# Patient Record
Sex: Male | Born: 1955 | Race: White | Hispanic: No | Marital: Married | Smoking: Former smoker
Health system: Southern US, Community
[De-identification: ages and names within clinical notes are randomized; demographics above are authoritative.]

## PROBLEM LIST (undated history)

## (undated) DIAGNOSIS — I639 Cerebral infarction, unspecified: Secondary | ICD-10-CM

## (undated) DIAGNOSIS — G4730 Sleep apnea, unspecified: Secondary | ICD-10-CM

## (undated) DIAGNOSIS — Z87442 Personal history of urinary calculi: Secondary | ICD-10-CM

## (undated) DIAGNOSIS — K700 Alcoholic fatty liver: Secondary | ICD-10-CM

## (undated) DIAGNOSIS — I10 Essential (primary) hypertension: Secondary | ICD-10-CM

## (undated) HISTORY — PX: COLONOSCOPY: SHX174

## (undated) HISTORY — PX: JOINT REPLACEMENT: SHX530

## (undated) HISTORY — PX: BACK SURGERY: SHX140

## (undated) HISTORY — PX: HERNIA REPAIR: SHX51

---

## 2006-09-03 HISTORY — PX: OTHER SURGICAL HISTORY: SHX169

## 2014-09-03 DIAGNOSIS — I639 Cerebral infarction, unspecified: Secondary | ICD-10-CM

## 2014-09-03 HISTORY — DX: Cerebral infarction, unspecified: I63.9

## 2014-10-05 ENCOUNTER — Other Ambulatory Visit (HOSPITAL_BASED_OUTPATIENT_CLINIC_OR_DEPARTMENT_OTHER): Payer: Self-pay | Admitting: Neurosurgery

## 2014-10-06 ENCOUNTER — Other Ambulatory Visit (HOSPITAL_BASED_OUTPATIENT_CLINIC_OR_DEPARTMENT_OTHER): Payer: Self-pay | Admitting: Neurosurgery

## 2014-10-06 DIAGNOSIS — M5126 Other intervertebral disc displacement, lumbar region: Secondary | ICD-10-CM

## 2014-10-07 ENCOUNTER — Ambulatory Visit: Payer: BLUE CROSS/BLUE SHIELD | Attending: Neurosurgery | Admitting: Physical Therapy

## 2014-10-07 DIAGNOSIS — M5126 Other intervertebral disc displacement, lumbar region: Secondary | ICD-10-CM | POA: Diagnosis present

## 2014-10-09 ENCOUNTER — Ambulatory Visit (HOSPITAL_BASED_OUTPATIENT_CLINIC_OR_DEPARTMENT_OTHER)
Admission: RE | Admit: 2014-10-09 | Discharge: 2014-10-09 | Disposition: A | Discharge status: Home / Self Care | Payer: BLUE CROSS/BLUE SHIELD | Source: Ambulatory Visit | Attending: Neurosurgery | Admitting: Neurosurgery

## 2014-10-09 DIAGNOSIS — M5126 Other intervertebral disc displacement, lumbar region: Secondary | ICD-10-CM

## 2014-10-09 DIAGNOSIS — M4806 Spinal stenosis, lumbar region: Secondary | ICD-10-CM | POA: Insufficient documentation

## 2014-10-09 DIAGNOSIS — R200 Anesthesia of skin: Secondary | ICD-10-CM | POA: Insufficient documentation

## 2014-10-09 MED ORDER — GADOBENATE DIMEGLUMINE 529 MG/ML IV SOLN: 20.0000 mL | Freq: Once | INTRAVENOUS | Status: AC | PRN

## 2014-10-11 ENCOUNTER — Ambulatory Visit: Payer: BLUE CROSS/BLUE SHIELD | Admitting: Physical Therapy

## 2014-10-11 DIAGNOSIS — M5126 Other intervertebral disc displacement, lumbar region: Secondary | ICD-10-CM | POA: Diagnosis not present

## 2014-10-14 ENCOUNTER — Ambulatory Visit: Payer: BLUE CROSS/BLUE SHIELD | Admitting: Physical Therapy

## 2014-10-14 DIAGNOSIS — M5126 Other intervertebral disc displacement, lumbar region: Secondary | ICD-10-CM | POA: Diagnosis not present

## 2014-11-01 ENCOUNTER — Encounter: Payer: Self-pay | Admitting: Physical Therapy

## 2014-11-01 ENCOUNTER — Ambulatory Visit: Payer: BLUE CROSS/BLUE SHIELD | Admitting: Physical Therapy

## 2014-11-01 DIAGNOSIS — M545 Low back pain, unspecified: Secondary | ICD-10-CM

## 2014-11-01 DIAGNOSIS — M2560 Stiffness of unspecified joint, not elsewhere classified: Secondary | ICD-10-CM

## 2014-11-01 DIAGNOSIS — M2569 Stiffness of other specified joint, not elsewhere classified: Secondary | ICD-10-CM

## 2014-11-01 DIAGNOSIS — M5126 Other intervertebral disc displacement, lumbar region: Secondary | ICD-10-CM | POA: Diagnosis not present

## 2014-11-01 NOTE — Therapy (Signed)
[facility] [address]  [address] [place], [place], [address] Phone: [phone]   Fax:  [phone]  Physical Therapy Treatment  Patient Details  Name: Christian Mcintyre MRN: [id number] Date of Birth: Dec 23, 1955 Referring Provider:  Mariam Dollar, MD  Encounter Date: 11/01/2014      PT End of Session - 11/01/14 1545    Visit Number 4   Number of Visits 12   Date for PT Re-Evaluation 11/18/14   PT Start Time 1538   PT Stop Time 1625   PT Time Calculation (min) 47 min      History reviewed. No pertinent past medical history.  Past Surgical History  Procedure Laterality Date  . Lumbar l4/5 fusion in 2008 Bilateral 2008    There were no vitals taken for this visit.  Visit Diagnosis:  LBP radiating to right leg  Back stiffness      Subjective Assessment - 11/01/14 1541    Symptoms pt has been on cruise out of country for past 2 weeks and returned to town yesterday.  States his activity level was not limited by back pain and he was able to participate in a great deal of walking and was able to participate in zipline.   Currently in Pain? Yes   Pain Score 4    Pain Location Back   Pain Orientation --  center and Right lower back pain   Pain Radiating Towards notes intermittent shooting pain down posterior R LE to foot.  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   Multiple Pain Sites No           TODAY'S TREATMENT: THEREX - Stretch B HS, Bridge 15x Low Row 45# 15x, 65# 15x Staggered Standing one arm row diagonals Double Black TB 10x each Staggered Standing punch diagonals Double Black TB 10x each Standing Hip EXT Blue TB at Ankles 15x Each (noted mild increased R LE pain following this) Prone B Knee Flexion Stretch 2x20"  MANUAL - R LE nerve glides, Prone PA grade 3->4 L1 to 4 and L5/S1.  Mechanical Traction (see below)         [facility] Adult PT Treatment/Exercise - 11/01/14 0001    Modalities   Modalities Traction   Traction   Type of Traction Lumbar  prone, neutral pull   Min (lbs) 38   Max (lbs) 76   Hold Time 60   Rest Time 20   Time 15                PT Education - 11/01/14 1723    Education provided Yes   Person(s) Educated Patient   Methods Explanation;Demonstration   Comprehension Returned demonstration;Verbalized understanding             PT Long Term Goals - 11/01/14 1534    PT LONG TERM GOAL #1   Title pt able to demonstrate / verbalize increased physical activity level by 11/18/14   Status On-going   PT LONG TERM GOAL #2   Title pt independent with advanced HEP as necessary by 11/18/14   Status On-going   PT LONG TERM GOAL #3   Title standing lumbar Extension AROM to 75% normal by 11/18/14   Status Achieved   PT LONG TERM GOAL #4   Title pt reports able to sit at work up to 2 hours without c/o LBP greater than 2/10 by 11/18/14   Status On-going   PT LONG TERM GOAL #5  Title pt able to return to recreational activities to include golfing without limitation by LBP by 11/18/14   Status On-going               Plan - 11/01/14 1724    Clinical Impression Statement pt with improved level of function and was able to participate in great deal of activities while on 2 week cruise since last treatment.  He states he continues to experience LBP to include paind and N/T extending into R LE.   Pt will benefit from skilled therapeutic intervention in order to improve on the following deficits Pain;Improper body mechanics;Decreased strength;Decreased mobility   Rehab Potential Good   PT Frequency 2x / week   PT Duration 6 weeks  POC Began 10/07/14   PT Treatment/Interventions Moist Heat;Therapeutic activities;Patient/family education;Therapeutic exercise;Traction;Ultrasound;Dry needling;Manual techniques;Neuromuscular re-education;Electrical Stimulation;Cryotherapy   PT Next Visit Plan squat assessment and training if able   Consulted  and Agree with Plan of Care Patient        Problem List There are no active problems to display for this patient.   [facility],Cameron Schwinn  PT, OCS  11/01/2014, 5:33 PM  [facility] [address]  [address] [place], [place], [address] Phone: [phone]   Fax:  [phone]

## 2014-11-01 NOTE — Patient Instructions (Signed)
HEP update: Staggered standing one arm row and punch 10-15 reps 3x/day Standing Hip Ext Blue TB at ankles 10-15 reps 3x/day

## 2014-11-04 ENCOUNTER — Ambulatory Visit: Payer: BLUE CROSS/BLUE SHIELD | Attending: Neurosurgery | Admitting: Physical Therapy

## 2014-11-04 ENCOUNTER — Encounter: Payer: Self-pay | Admitting: Physical Therapy

## 2014-11-04 DIAGNOSIS — M2560 Stiffness of unspecified joint, not elsewhere classified: Secondary | ICD-10-CM

## 2014-11-04 DIAGNOSIS — M5126 Other intervertebral disc displacement, lumbar region: Secondary | ICD-10-CM | POA: Diagnosis not present

## 2014-11-04 DIAGNOSIS — M2569 Stiffness of other specified joint, not elsewhere classified: Secondary | ICD-10-CM

## 2014-11-04 DIAGNOSIS — M545 Low back pain, unspecified: Secondary | ICD-10-CM

## 2014-11-04 NOTE — Therapy (Signed)
[facility] Outpatient Rehabilitation [facility] [address]  [address] [place], [place], [address] Phone: [phone]   Fax:  [phone]  Physical Therapy Treatment  Patient Details  Name: Christian Mcintyre MRN: [id number] Date of Birth: [birth year] Referring Provider:  Mariam Dollar, MD  Encounter Date: 11/04/2014      PT End of Session - 11/04/14 0808    Visit Number 5   Number of Visits 12   Date for PT Re-Evaluation 11/18/14   PT Start Time 0807   PT Stop Time 0902   PT Time Calculation (min) 55 min      History reviewed. No pertinent past medical history.  Past Surgical History  Procedure Laterality Date  . Lumbar l4/5 fusion in 2008 Bilateral 2008    There were no vitals taken for this visit.  Visit Diagnosis:  LBP radiating to right leg  Back stiffness      Subjective Assessment - 11/04/14 0809    Symptoms noted mild increase in pain following last treatment but states by evening "felt way better".   Currently in Pain? Yes   Pain Score 4    Pain Location Back   Pain Orientation --  central and R Lower Back   Multiple Pain Sites No              TODAY'S TREATMENT: THEREX - NuStep LVL 5, 3' Bridge 15x, Stretch B HS, SKTC, Piriformis ALT SLR with Abdominal Bracing 10x each, Partial Curl-up 20x, LE Bicycle 10x each Prone prayer/cobra combo 5x, diagonal prayer stretch 20" each  TRX Squat 10x, TRX OH Squat 10x, TRX pull into Y with neutral spine 10x  Low Row 55# 15x  MANUAL - prone PA mobes Grade 3 and progressing to grade 4 L1 to L3 and L5/S1       [facility] Adult PT Treatment/Exercise - 11/04/14 0840    Modalities   Modalities Traction   Traction   Type of Traction Lumbar  prone, neutral pull   Min (lbs) 40   Max (lbs) 80   Hold Time 60   Rest Time 20   Time 15                     PT Long Term Goals - 11/01/14 1534    PT LONG TERM GOAL #1   Title pt able to demonstrate / verbalize increased physical  activity level by 11/18/14   Status On-going   PT LONG TERM GOAL #2   Title pt independent with advanced HEP as necessary by 11/18/14   Status On-going   PT LONG TERM GOAL #3   Title standing lumbar Extension AROM to 75% normal by 11/18/14   Status Achieved   PT LONG TERM GOAL #4   Title pt reports able to sit at work up to 2 hours without c/o LBP greater than 2/10 by 11/18/14   Status On-going   PT LONG TERM GOAL #5   Title pt able to return to recreational activities to include golfing without limitation by LBP by 11/18/14   Status On-going               Plan - 11/04/14 0844    Clinical Impression Statement tolerated TRX training well and displays good squat mechanics with TRX and no c/o LBP.  Increased traction a few pounds.  Overall is displaying improved level of function and activity tolerance but still with pain in 4/10 range.   PT Next Visit Plan  more squat and lunge training as able   Consulted and Agree with Plan of Care Patient        Problem List There are no active problems to display for this patient.   Paisly Fingerhut PT, OCS 11/04/2014, 9:58 AM  [facility] [address]  [address] [place], [place], [address] Phone: [phone]   Fax:  [phone]

## 2014-11-08 ENCOUNTER — Ambulatory Visit: Payer: BLUE CROSS/BLUE SHIELD | Admitting: Physical Therapy

## 2014-11-08 DIAGNOSIS — M79604 Pain in right leg: Secondary | ICD-10-CM

## 2014-11-08 DIAGNOSIS — M545 Low back pain: Secondary | ICD-10-CM

## 2014-11-08 DIAGNOSIS — M5126 Other intervertebral disc displacement, lumbar region: Secondary | ICD-10-CM | POA: Diagnosis not present

## 2014-11-08 NOTE — Therapy (Signed)
[facility] [address]  [address] [place], [place], [address] Phone: [phone]   Fax:  [phone]  Physical Therapy Treatment  Patient Details  Name: Christian Mcintyre MRN: [id number] Date of Birth: Sep 09, 1955 Referring Provider:  Donalee Citrinram, Gary, MD  Encounter Date: 11/08/2014      PT End of Session - 11/08/14 1538    Visit Number 6   Number of Visits 12   Date for PT Re-Evaluation 11/18/14   PT Start Time 1535   PT Stop Time 1630   PT Time Calculation (min) 55 min      No past medical history on file.  Past Surgical History  Procedure Laterality Date  . Lumbar l4/5 fusion in 2008 Bilateral 2008    There were no vitals taken for this visit.  Visit Diagnosis:  LBP radiating to right leg      Subjective Assessment - 11/08/14 1534    Symptoms states pain increased to 5-6/10 over the weekend believing due to yard work.  Notes R LE N/T to bottom of foot but states seems a little less intense lately. Is sleeping well without limitation by back pain.  Most pain noted with Sitting and relieved with Walking.   Currently in Pain? Yes   Pain Score --  5-6/10 LBP   Pain Orientation --  central and R LBP   Multiple Pain Sites No            TODAY'S TREATMENT: THEREX - NuStep LVL 5, 3' Bridge 15x, Stretch B HS, SKTC, Piriformis LTR 1' ALT SLR with Abdominal Bracing 10x each, Partial Curl-up 20x, Reverse Curl-up with Ball between knees 10x Staggered standing one-arm high row 15# 10x each Prone prayer/cobra combo 5x  MANUAL - prone PA mobes Grade 3 and progressing to grade 4 L1 to L3 and L5/S1        [facility] Adult PT Treatment/Exercise - 11/08/14 0001    Modalities   Modalities Traction   Traction   Type of Traction Lumbar  prone, neutral pull   Min (lbs) 43   Max (lbs) 86   Hold Time 60   Rest Time 20   Time 17                     PT Long Term Goals - 11/01/14 1534    PT LONG TERM GOAL #1   Title pt able to demonstrate / verbalize increased physical activity level by 11/18/14   Status On-going   PT LONG TERM GOAL #2   Title pt independent with advanced HEP as necessary by 11/18/14   Status On-going   PT LONG TERM GOAL #3   Title standing lumbar Extension AROM to 75% normal by 11/18/14   Status Achieved   PT LONG TERM GOAL #4   Title pt reports able to sit at work up to 2 hours without c/o LBP greater than 2/10 by 11/18/14   Status On-going   PT LONG TERM GOAL #5   Title pt able to return to recreational activities to include golfing without limitation by LBP by 11/18/14   Status On-going               Plan - 11/08/14 1614    Clinical Impression Statement pt with increased pain due to increased activity over the weekend.  Pain most noted with sitting and decreased with walking (common with HNP).  Notes continued but not worsened R LE N/T.   PT  Next Visit Plan return to more squat and lunge training next treatment if improved pain        Problem List There are no active problems to display for this patient.   Hallee Mckenny PT, OCS 11/08/2014, 5:06 PM  [facility] [address]  [address] [place], [place], [address] Phone: [phone]   Fax:  [phone]

## 2014-11-11 ENCOUNTER — Encounter: Payer: Self-pay | Admitting: Physical Therapy

## 2014-11-11 ENCOUNTER — Ambulatory Visit: Payer: BLUE CROSS/BLUE SHIELD | Admitting: Physical Therapy

## 2014-11-11 DIAGNOSIS — M545 Low back pain, unspecified: Secondary | ICD-10-CM

## 2014-11-11 DIAGNOSIS — M5126 Other intervertebral disc displacement, lumbar region: Secondary | ICD-10-CM | POA: Diagnosis not present

## 2014-11-11 NOTE — Therapy (Signed)
[facility] [address]  [address] [place], [place], [address] Phone: [phone]   Fax:  [phone]  Physical Therapy Treatment  Patient Details  Name: Christian Mcintyre MRN: [id number] Date of Birth: Jan 30, 1956 Referring Provider:  Donalee Citrinram, Gary, MD  Encounter Date: 11/11/2014      PT End of Session - 11/11/14 1718    Visit Number 7   Number of Visits 12   Date for PT Re-Evaluation 11/18/14   PT Start Time 1625   PT Stop Time 1725   PT Time Calculation (min) 60 min      History reviewed. No pertinent past medical history.  Past Surgical History  Procedure Laterality Date  . Lumbar l4/5 fusion in 2008 Bilateral 2008    There were no vitals filed for this visit.  Visit Diagnosis:  LBP radiating to right leg      Subjective Assessment - 11/11/14 1627    Symptoms "feeling a little better today", 4/10.   Currently in Pain? Yes   Pain Score 4    Pain Location Back   Pain Orientation --  central and R LBP   Aggravating Factors  sitting   Pain Relieving Factors walking   Multiple Pain Sites No              TODAY'S TREATMENT: THEREX - NuStep LVL 5, 3' Stretch B HS, B LE Nerve Glides LTR 1' Low pulley lawnmower pull 10# 10x each, 20# 10x Standing Lifting Diagonal Black TB 10x each then 10x with Double Black TB Standing Chopping Diagonal Double Black TB 6x (stopped due to noting pain in posterior L LE when performed chopping to L)  MANUAL - prone CPA and B UPA mobes Grade 3 and progressing to grade 4 L1 to L3 and L5/S1 Taping to L-spine as in past (3 strips) Mechanical Traction - L-spine, prone, neutral pull, 60"/30", 86#/43#, 4818'                       PT Long Term Goals - 11/11/14 1721    PT LONG TERM GOAL #1   Title pt able to demonstrate / verbalize increased physical activity level by 11/18/14   Status Achieved   PT LONG TERM GOAL #2   Title pt independent with advanced HEP as  necessary by 11/18/14   Status On-going   PT LONG TERM GOAL #3   Title standing lumbar Extension AROM to 75% normal by 11/18/14   Status Achieved   PT LONG TERM GOAL #4   Title pt reports able to sit at work up to 2 hours without c/o LBP greater than 2/10 by 11/18/14   Status On-going   PT LONG TERM GOAL #5   Title pt able to return to recreational activities to include golfing without limitation by LBP by 11/18/14   Status On-going               Plan - 11/11/14 1718    Clinical Impression Statement pt good progress but may have pushed too hard today as he noted L LE pain with chopping diagonals.  Pain stopped once activity stopped and no pain with manaul following this.  Pt to be seen once next week then will eb out of town again.   Pt will benefit from skilled therapeutic intervention in order to improve on the following deficits Pain;Improper body mechanics;Decreased strength;Decreased mobility   Rehab Potential Good   PT Treatment/Interventions Moist Heat;Therapeutic activities;Patient/family  education;Therapeutic exercise;Traction;Ultrasound;Dry needling;Manual techniques;Neuromuscular re-education;Electrical Stimulation;Cryotherapy   PT Next Visit Plan re-assess for extension to MD?   Consulted and Agree with Plan of Care Patient        Problem List There are no active problems to display for this patient.   Jiselle Sheu PT, OCS 11/11/2014, 5:28 PM  [facility] [address]  [address] [place], [place], [address] Phone: [phone]   Fax:  [phone]

## 2014-11-15 ENCOUNTER — Ambulatory Visit: Payer: BLUE CROSS/BLUE SHIELD | Admitting: Physical Therapy

## 2014-11-15 ENCOUNTER — Encounter: Payer: Self-pay | Admitting: Physical Therapy

## 2014-11-15 DIAGNOSIS — M2569 Stiffness of other specified joint, not elsewhere classified: Secondary | ICD-10-CM

## 2014-11-15 DIAGNOSIS — M545 Low back pain, unspecified: Secondary | ICD-10-CM

## 2014-11-15 DIAGNOSIS — M5126 Other intervertebral disc displacement, lumbar region: Secondary | ICD-10-CM | POA: Diagnosis not present

## 2014-11-15 DIAGNOSIS — M2560 Stiffness of unspecified joint, not elsewhere classified: Secondary | ICD-10-CM

## 2014-11-15 NOTE — Therapy (Signed)
[facility] [address]  [address] [place], [place], [address] Phone: [phone]   Fax:  [phone]  Physical Therapy Treatment  Patient Details  Name: Christian Mcintyre MRN: [id number] Date of Birth: 06/13/1956 Referring Provider:  Donalee Citrinram, Gary, MD  Encounter Date: 11/15/2014      PT End of Session - 11/15/14 1541    Visit Number 8   Number of Visits 12   Date for PT Re-Evaluation 11/18/14   PT Start Time 1540   PT Stop Time 1638   PT Time Calculation (min) 58 min      History reviewed. No pertinent past medical history.  Past Surgical History  Procedure Laterality Date  . Lumbar l4/5 fusion in 2008 Bilateral 2008    There were no vitals filed for this visit.  Visit Diagnosis:  LBP radiating to right leg  Back stiffness      Subjective Assessment - 11/15/14 1542    Symptoms no increased soreness after last treatment, went driving range and hit 324+100+ balls with minimal increased soreness.  Rates LBP 5/10 today.  Pt is going out of town for golf trip tomorrow.   Currently in Pain? Yes   Pain Score 5    Pain Location Back   Pain Orientation --  central and R LBP   Multiple Pain Sites No          TODAY'S TREATMENT: TherEx - NuStep LVL 5, 4' Stretch B HS and Piri, R LE Nerve Glides Partial Curl up 20x Bridge 20x Unsupported LTR 2x20" Partial Single Leg Bridge 10x each Fitter BW Lunge 2 Black 2x15 each Pulldown 55# 15x, 65# 15x Low Pulley Row "Lawnmower" 20# 10x each Mid Pulley "Punch" 10# 2x12 Prayer/Cobra stretch combo 10x  Mechanical Traction - L-spine, Prone, Neutral pull, 86#/43#, 60"/20", 18'                           PT Long Term Goals - 11/15/14 1625    PT LONG TERM GOAL #1   Title pt able to demonstrate / verbalize increased physical activity level by 11/18/14   Status Achieved   PT LONG TERM GOAL #2   Title pt independent with advanced HEP as necessary by 11/18/14   PT LONG TERM GOAL #3   Title standing lumbar Extension AROM to 75% normal by 11/18/14   Status Achieved   PT LONG TERM GOAL #4   Title pt reports able to sit at work up to 2 hours without c/o LBP greater than 2/10 by 11/18/14   Status On-going   PT LONG TERM GOAL #5   Title pt able to return to recreational activities to include golfing without limitation by LBP by 11/18/14   Status On-going               Plan - 11/15/14 1623    Clinical Impression Statement no increasing pain with increasing intensity of treatments.  Tolerated unsupported LTR well (challenging).  N/T to R LE really not going away however despite improving pain and function.  Pt out of town for rest of this week for golf trip. Will re-assess upon return to update to MD.   Pt will benefit from skilled therapeutic intervention in order to improve on the following deficits Pain;Improper body mechanics;Decreased strength;Decreased mobility   Rehab Potential Good   PT Next Visit Plan re-assess for PR to MD   Consulted and Agree with Plan of  Care Patient        Problem List There are no active problems to display for this patient.   [facility] PT, OCS 11/15/2014, 4:47 PM  [facility] [address]  [address] [place], [place], [address] Phone: [phone]   Fax:  [phone]

## 2014-11-23 ENCOUNTER — Ambulatory Visit: Payer: BLUE CROSS/BLUE SHIELD | Admitting: Physical Therapy

## 2014-11-23 DIAGNOSIS — M2560 Stiffness of unspecified joint, not elsewhere classified: Secondary | ICD-10-CM

## 2014-11-23 DIAGNOSIS — M545 Low back pain: Secondary | ICD-10-CM

## 2014-11-23 DIAGNOSIS — M5126 Other intervertebral disc displacement, lumbar region: Secondary | ICD-10-CM | POA: Diagnosis not present

## 2014-11-23 DIAGNOSIS — M79604 Pain in right leg: Secondary | ICD-10-CM

## 2014-11-23 DIAGNOSIS — M2569 Stiffness of other specified joint, not elsewhere classified: Secondary | ICD-10-CM

## 2014-11-23 NOTE — Therapy (Signed)
[facility] [address]  [place], [place], [address] Phone: [phone]   Fax:  [phone]  Physical Therapy Treatment  Patient Details  Name: Christian Mcintyre MRN: [id number] Date of Birth: 12/27/1955 Referring Provider:  Kary Kos, MD  Encounter Date: 11/23/2014      PT End of Session - 11/23/14 0858    Visit Number 9   Number of Visits 17   Date for PT Re-Evaluation 12/21/14   PT Start Time 7939   PT Stop Time 0945   PT Time Calculation (min) 50 min      No past medical history on file.  Past Surgical History  Procedure Laterality Date  . Lumbar l4/5 fusion in 2008 Bilateral 2008    There were no vitals filed for this visit.  Visit Diagnosis:  LBP radiating to right leg - Plan: PT plan of care cert/re-cert  Back stiffness - Plan: PT plan of care cert/re-cert      Subjective Assessment - 11/23/14 0857    Symptoms went out of town on golf trip, noted increased pain 1st day of golfing but was able to play next 3 days without noting increased pain.  States pain up to 8/10 but otherwise 5-6/10. Is performing HEP.   Currently in Pain? Yes   Pain Score 6    Pain Location Back   Pain Orientation --  Central and R LBP   Aggravating Factors  sitting   Pain Relieving Factors walking and lying down, no problems sleeping but hurts very soon after waking   Multiple Pain Sites No            [facility] PT Assessment - 11/23/14 0001    ROM / Strength   AROM / PROM / Strength AROM;Strength   AROM   Overall AROM Comments Lumbar Flexion hands to toes with shooting pain into R LE, Extension 75% normal without c/o pain.   Strength   Overall Strength Comments B LE MMT 5/5 grossly without pain.  Pt does note intermittent shooting pain and buckling sensation into B LE (R>>L).  States this happens several times/day.        TODAY'S TREATMENT: Manual - B UPA and CPA mobes throughout l-spine other than L4/5 area due  to past fusion at this level  TherEx - NuStep LVL 5, 3' Stretch B HS, Piri, RF, SKTC, LTR Prone SLR 10x each Bridge 15x  Mechanical Traction - l-spine, prone, neutral pull, 86#/43#, 60"/20", 18'                      PT Long Term Goals - 11/23/14 0919    PT LONG TERM GOAL #2   Title pt independent with advanced HEP as necessary by 12/21/14   Status On-going   PT LONG TERM GOAL #4   Title pt reports able to sit at work up to 2 hours without c/o LBP greater than 2/10 by 12/21/14  notes up to 7/10 pain along with increasing R LE N/T with 2 hours sitting   Status On-going   PT LONG TERM GOAL #5   Title pt able to return to recreational activities to include golfing without limitation by LBP by 12/21/14  is able to participate and typically without noting increased pain however did experience bout of 8/10 following 18 holes golf last week but next 3 days was able to golf without increased pain above baseline (5-6/10)   Status Partially Met  Plan - 11/23/14 1033    Clinical Impression Statement pt with good activity tolerance and rarely notes increased pain following treatments; however, overall level of pain still high at 5-6/10 most days.  He notes most pain with sitting and decreased pain with walking.  Pt states he has called MD office to attempt to schedule follow-up but has not heard back at this time.  Pt my be appropriate for l-spine injection/epidural to help decrease his baseline pain.  Recommend patient continues with physical therapy for 4 weeks to further progress level of function and hopefully assist with pain reduction.   Pt will benefit from skilled therapeutic intervention in order to improve on the following deficits Pain   Rehab Potential Good   PT Frequency 2x / week   PT Duration 4 weeks   PT Treatment/Interventions Therapeutic exercise;Manual techniques;Traction;Therapeutic activities;Electrical Stimulation   PT Next Visit Plan  progress as able   Consulted and Agree with Plan of Care Patient        Problem List There are no active problems to display for this patient.   [facility] PT, OCS 11/23/2014, 10:43 AM  [facility] [address]  [place], [place], [address] Phone: [phone]   Fax:  [phone]

## 2014-11-25 ENCOUNTER — Ambulatory Visit: Payer: BLUE CROSS/BLUE SHIELD | Admitting: Physical Therapy

## 2014-11-25 DIAGNOSIS — M79604 Pain in right leg: Secondary | ICD-10-CM

## 2014-11-25 DIAGNOSIS — M545 Low back pain: Secondary | ICD-10-CM

## 2014-11-25 DIAGNOSIS — M5126 Other intervertebral disc displacement, lumbar region: Secondary | ICD-10-CM | POA: Diagnosis not present

## 2014-11-25 NOTE — Therapy (Signed)
[facility] [address]  [place], [place], [address] Phone: [phone]   Fax:  [phone]  Physical Therapy Treatment  Patient Details  Name: Christian Mcintyre MRN: [id number] Date of Birth: [birth year] Referring Provider:  Kary Kos, MD  Encounter Date: 11/25/2014      PT End of Session - 11/25/14 1620    Visit Number 10   Number of Visits 17   Date for PT Re-Evaluation 12/21/14   PT Start Time 3419   PT Stop Time 3790   PT Time Calculation (min) 61 min      No past medical history on file.  Past Surgical History  Procedure Laterality Date  . Lumbar l4/5 fusion in 2008 Bilateral 2008    There were no vitals filed for this visit.  Visit Diagnosis:  LBP radiating to right leg  LBP radiating to both legs      Subjective Assessment - 11/25/14 1538    Symptoms Pt states he has been experiencing shooting pains down B LEs since waking this AM with sit<->stand transfers.  States pain begins in central mid l-spine and extends down B posterior LE to calves.  Unknown reason for increased symptoms today. No change in N/T to R LE - still present without increase or decrease intensity/frequency today per pt report.   Currently in Pain? Yes   Pain Score --  9/10 shooting pain and 6-7/10 residual pain between bouts of increased pain           TODAY'S TREATMENT: Focus on stability today due to symptoms seeming related to poor control/stability today Bridge 15x (noted mild catch on 7th rep but nothing else) Small ROM Reverse Curl-up 10x ALT SLR with Contra Extension isometric 10x Hooklying ALT Single Knee Ext 10x3" each Supine DL SLR 10x2" Quadruped ALT LE 10x (noted mild catch in L-spine upon recovery from this exercise) Quadruped Hip EXT 5x each (fewer reps due to HS cramping) Quadruped Dirty Dog 10x each Quadruped ALT LE/UE 10x Squat with Yellow TB Y 5x (catch on 1st rep but no others) Sit<->Stand 5x (no  pain)  IFC (80-150Hz ) with CP to L-spine prone x 20'                           PT Long Term Goals - 11/23/14 0919    PT LONG TERM GOAL #2   Title pt independent with advanced HEP as necessary by 12/21/14   Status On-going   PT LONG TERM GOAL #4   Title pt reports able to sit at work up to 2 hours without c/o LBP greater than 2/10 by 12/21/14  notes up to 7/10 pain along with increasing R LE N/T with 2 hours sitting   Status On-going   PT LONG TERM GOAL #5   Title pt able to return to recreational activities to include golfing without limitation by LBP by 12/21/14  is able to participate and typically without noting increased pain however did experience bout of 8/10 following 18 holes golf last week but next 3 days was able to golf without increased pain above baseline (5-6/10)   Status Partially Met               Plan - 11/25/14 1620    Clinical Impression Statement pt with more of a stability issue presentation today in that notes frequent shooting pain with sit<->stand transfers.  Treated with stability exercises attempting to promote  better multifiti and lumbar extensor firing.  Seemed at least somewhat benefitcial in that no pain with 5 sit<->stand attempts following exercises.   PT Next Visit Plan assess pain and treat according to symptom presentation, likely continue stablity treatment approach   Consulted and Agree with Plan of Care Patient        Problem List There are no active problems to display for this patient.   [facility] PT, OCS 11/25/2014, 4:44 PM  [facility] [address]  [place], [place], [address] Phone: [phone]   Fax:  [phone]

## 2014-11-29 ENCOUNTER — Ambulatory Visit: Payer: BLUE CROSS/BLUE SHIELD | Admitting: Physical Therapy

## 2014-11-29 DIAGNOSIS — M545 Low back pain, unspecified: Secondary | ICD-10-CM

## 2014-11-29 DIAGNOSIS — M5126 Other intervertebral disc displacement, lumbar region: Secondary | ICD-10-CM | POA: Diagnosis not present

## 2014-11-29 DIAGNOSIS — M79604 Pain in right leg: Secondary | ICD-10-CM

## 2014-11-29 NOTE — Therapy (Signed)
[facility] [address]  [place], [place], [address] Phone: [phone]   Fax:  [phone]  Physical Therapy Treatment  Patient Details  Name: Christian Mcintyre MRN: [id number] Date of Birth: July 30, 1956 Referring Provider:  Kary Kos, MD  Encounter Date: 11/29/2014      PT End of Session - 11/29/14 1612    Visit Number 11   Number of Visits 17   Date for PT Re-Evaluation 12/21/14   PT Start Time 0034   PT Stop Time 1611   PT Time Calculation (min) 33 min      No past medical history on file.  Past Surgical History  Procedure Laterality Date  . Lumbar l4/5 fusion in 2008 Bilateral 2008    There were no vitals filed for this visit.  Visit Diagnosis:  LBP radiating to both legs      Subjective Assessment - 11/29/14 1541    Symptoms Has MD f/u on 12/14/14. States has felt better since last treatment in that no longer experiencing shooting pains into LE with sit<->stand transfers.  He is back at his typical level of 5/10 pain today.  Noted 7/10 pain 2 days ago due to helping his wife after she dislocaed her hip while in the yard.   Currently in Pain? Yes   Pain Score 5    Pain Location Back   Pain Orientation Lower  central and R LBP   Multiple Pain Sites No               TODAY'S TREATMENT: NuStep lvl 5, 4' Continuing with stability focus due to benefit following last treatment Bridge 15x ALT SLR with Contra Extension isometric 10x Supine DL SLR 10x2" Partial Curl-up 10x Diagonal Partial Curl-up 10x Quadruped ALT LE/UE 12x             PT Education - 11/29/14 1612    Education provided Yes   Education Details HEP update   Person(s) Educated Patient   Methods Explanation;Demonstration;Handout   Comprehension Returned demonstration;Verbalized understanding             PT Long Term Goals - 11/23/14 0919    PT LONG TERM GOAL #2   Title pt independent with advanced HEP as  necessary by 12/21/14   Status On-going   PT LONG TERM GOAL #4   Title pt reports able to sit at work up to 2 hours without c/o LBP greater than 2/10 by 12/21/14  notes up to 7/10 pain along with increasing R LE N/T with 2 hours sitting   Status On-going   PT LONG TERM GOAL #5   Title pt able to return to recreational activities to include golfing without limitation by LBP by 12/21/14  is able to participate and typically without noting increased pain however did experience bout of 8/10 following 18 holes golf last week but next 3 days was able to golf without increased pain above baseline (5-6/10)   Status Partially Met               Plan - 11/29/14 1613    Clinical Impression Statement Good progress following last stability focused treatment so continued with this today to include HEP progression with same.  Still concerned due to persistent 5/10 pain per pt report.  Pt to MD in 2 weeks for f/u.   PT Next Visit Plan progress stability training as tolerated, discontinue traction   Consulted and Agree with Plan of Care Patient  Problem List There are no active problems to display for this patient.   [facility] PT, OCS 11/29/2014, 4:17 PM  [facility] [address]  [place], [place], [address] Phone: [phone]   Fax:  [phone]

## 2014-12-02 ENCOUNTER — Ambulatory Visit: Payer: BLUE CROSS/BLUE SHIELD | Admitting: Physical Therapy

## 2014-12-02 DIAGNOSIS — M545 Low back pain, unspecified: Secondary | ICD-10-CM

## 2014-12-02 DIAGNOSIS — M5126 Other intervertebral disc displacement, lumbar region: Secondary | ICD-10-CM | POA: Diagnosis not present

## 2014-12-02 NOTE — Therapy (Signed)
[facility] [address]  [place], [place], [address] Phone: [phone]   Fax:  [phone]  Physical Therapy Treatment  Patient Details  Name: Christian Mcintyre MRN: [id number] Date of Birth: 10-16-1955 Referring Provider:  Kary Kos, MD  Encounter Date: 12/02/2014      PT End of Session - 12/02/14 1748    Visit Number 12   Number of Visits 17   Date for PT Re-Evaluation 12/21/14   PT Start Time 9622   PT Stop Time 1745   PT Time Calculation (min) 35 min      No past medical history on file.  Past Surgical History  Procedure Laterality Date  . Lumbar l4/5 fusion in 2008 Bilateral 2008    There were no vitals filed for this visit.  Visit Diagnosis:  LBP radiating to right leg      Subjective Assessment - 12/02/14 1712    Symptoms No increase in pain since last treatment.  States pain is 4/10 today and can't recall experiencing any shooting pain today.  Still N/T noted to bottom of R foot which he states is present most days any time he is out of bed.  He states this lessens with walking and sometimes may go away if he "walks long enough".  Has MD f/u on 12/14/14.   Currently in Pain? Yes   Pain Score 4    Pain Location Back   Pain Orientation Lower;Right  central and R LBP       Today's Treatment NuStep LVL 5, 4' Stretch B Piri and HS and RF/Iliopsoas TRX Push-up and High rows 10x each with emphasis on trunk stability TRX Y 10x Dead Bug 30" Crunches 15x Standing Pullover 25# 10x Alt Unsupported Chest Press 25# 10x with focus on trunk stability                            PT Long Term Goals - 11/23/14 0919    PT LONG TERM GOAL #2   Title pt independent with advanced HEP as necessary by 12/21/14   Status On-going   PT LONG TERM GOAL #4   Title pt reports able to sit at work up to 2 hours without c/o LBP greater than 2/10 by 12/21/14  notes up to 7/10 pain along with  increasing R LE N/T with 2 hours sitting   Status On-going   PT LONG TERM GOAL #5   Title pt able to return to recreational activities to include golfing without limitation by LBP by 12/21/14  is able to participate and typically without noting increased pain however did experience bout of 8/10 following 18 holes golf last week but next 3 days was able to golf without increased pain above baseline (5-6/10)   Status Partially Met               Plan - 12/02/14 1750    Clinical Impression Statement Seems to have most progress to date lately with more of a shift to stability training.  He goes to MD in less than 2 weeks - will send update.   PT Next Visit Plan progress stability training as tolerated, discontinue traction        Problem List There are no active problems to display for this patient.   Gloristine Turrubiates PT, OCS 12/02/2014, 5:52 PM  [facility] [address]  [address] [place],  [place], [address] Phone: [phone]   Fax:  [phone]

## 2014-12-06 ENCOUNTER — Ambulatory Visit: Payer: BLUE CROSS/BLUE SHIELD | Attending: Neurosurgery | Admitting: Physical Therapy

## 2014-12-06 DIAGNOSIS — M5126 Other intervertebral disc displacement, lumbar region: Secondary | ICD-10-CM | POA: Diagnosis not present

## 2014-12-06 DIAGNOSIS — M545 Low back pain, unspecified: Secondary | ICD-10-CM

## 2014-12-06 DIAGNOSIS — M79604 Pain in right leg: Secondary | ICD-10-CM

## 2014-12-06 NOTE — Therapy (Signed)
[facility] [address]  [place], [place], [address] Phone: [phone]   Fax:  [phone]  Physical Therapy Treatment  Patient Details  Name: Christian Mcintyre MRN: [id number] Date of Birth: 12-Aug-1956 Referring Provider:  Kary Kos, MD  Encounter Date: 12/06/2014      PT End of Session - 12/06/14 0819    Visit Number 13   Number of Visits 17   Date for PT Re-Evaluation 12/21/14   PT Start Time 0815   PT Stop Time 0845   PT Time Calculation (min) 30 min      No past medical history on file.  Past Surgical History  Procedure Laterality Date  . Lumbar l4/5 fusion in 2008 Bilateral 2008    There were no vitals filed for this visit.  Visit Diagnosis:  LBP radiating to right leg      Subjective Assessment - 12/06/14 0816    Subjective States was able to perform some yard and garage work this weekend without noting significant increased pain.  Today states is feeling "pretty good" and rates LBP 3-4/10.  States R foot N/T does not seem as bad today so far (earlier appointment today than typical).  Has MD f/u 12/14/14.   Currently in Pain? Yes   Pain Score 3    Pain Location Back   Pain Orientation Right;Lower      Today's Treatment (mild reduced intensity to limit sweating due to going straight to work after this) NuStep LVL 5, 4' Bridge 15x Stretch B Piri and HS and RF/Iliopsoas Bridge with March 10x with hands on table then 10x without hands on table Crunches 15x Fitter BW Lunge 2 Black 15x with B Pole A Alt Unsupported Chest Press 25# 2x10 with focus on trunk stability Staggered standing one-arm high row 15# 2x15              PT Long Term Goals - 11/23/14 0919    PT LONG TERM GOAL #2   Title pt independent with advanced HEP as necessary by 12/21/14   Status On-going   PT LONG TERM GOAL #4   Title pt reports able to sit at work up to 2 hours without c/o LBP greater than 2/10 by 12/21/14   notes up to 7/10 pain along with increasing R LE N/T with 2 hours sitting   Status On-going   PT LONG TERM GOAL #5   Title pt able to return to recreational activities to include golfing without limitation by LBP by 12/21/14  is able to participate and typically without noting increased pain however did experience bout of 8/10 following 18 holes golf last week but next 3 days was able to golf without increased pain above baseline (5-6/10)   Status Partially Met               Plan - 12/06/14 0841    Clinical Impression Statement best function, some improvement in N/T, and least pain to date this past weekend.  Seems to be  responding very well to progressive stability program without mechanical traction.   PT Next Visit Plan assess for PR to MD for pending MD f/u on 12/13/13        Problem List There are no active problems to display for this patient.   Matson Welch PT, OCS 12/06/2014, 8:55 AM  [facility] [place], [place], [address] Phone: [phone]   Fax:  [phone]

## 2014-12-09 ENCOUNTER — Ambulatory Visit: Payer: BLUE CROSS/BLUE SHIELD | Admitting: Physical Therapy

## 2014-12-13 ENCOUNTER — Ambulatory Visit: Payer: BLUE CROSS/BLUE SHIELD | Admitting: Physical Therapy

## 2014-12-16 ENCOUNTER — Ambulatory Visit: Payer: BLUE CROSS/BLUE SHIELD | Admitting: Physical Therapy

## 2014-12-16 DIAGNOSIS — M545 Low back pain, unspecified: Secondary | ICD-10-CM

## 2014-12-16 DIAGNOSIS — M5126 Other intervertebral disc displacement, lumbar region: Secondary | ICD-10-CM | POA: Diagnosis not present

## 2014-12-16 NOTE — Therapy (Signed)
[facility] [address]  [place], [place], [address] Phone: [phone]   Fax:  [phone]  Physical Therapy Treatment  Patient Details  Name: Christian Mcintyre MRN: [id number] Date of Birth: 09/18/1955 Referring Provider:  Kary Kos, MD  Encounter Date: 12/16/2014      PT End of Session - 12/16/14 0734    Visit Number 14   Number of Visits 17   Date for PT Re-Evaluation 12/30/14   PT Start Time 0727   PT Stop Time 0802   PT Time Calculation (min) 35 min      No past medical history on file.  Past Surgical History  Procedure Laterality Date  . Lumbar l4/5 fusion in 2008 Bilateral 2008    There were no vitals filed for this visit.  Visit Diagnosis:  LBP radiating to right leg      Subjective Assessment - 12/16/14 0729    Subjective Pt saw MD on 12/14/14 and plan is to continue with PT and undergo injection/epidural on 12/29/14.  States if no benefit from injections will pursue surgical options.  States LBP is worse this AM compared to most mornings - states first thing is typically best part of day but this AM pain was 6-7/10 and is currently 5/10.  No idea why increased pain today.   Currently in Pain? Yes   Pain Score 5    Pain Location Back   Pain Orientation Right;Lower   Multiple Pain Sites No          Today's Treatment (pt late so reduced treatment time) NuStep LVL 5, 4' Bridge 15x Stretch B Piri and HS and RF/Iliopsoas Crunches 15x ALT SLR 15x each Side-Lying Clam Black TB 15x each Side Bridge elbow and knee 10x each (difficult but no c/o increased pain) Quadruped UE/LE 10x                           PT Long Term Goals - 11/23/14 0919    PT LONG TERM GOAL #2   Title pt independent with advanced HEP as necessary by 12/21/14   Status On-going   PT LONG TERM GOAL #4   Title pt reports able to sit at work up to 2 hours without c/o LBP greater than 2/10 by 12/21/14   notes up to 7/10 pain along with increasing R LE N/T with 2 hours sitting   Status On-going   PT LONG TERM GOAL #5   Title pt able to return to recreational activities to include golfing without limitation by LBP by 12/21/14  is able to participate and typically without noting increased pain however did experience bout of 8/10 following 18 holes golf last week but next 3 days was able to golf without increased pain above baseline (5-6/10)   Status Partially Met               Plan - 12/16/14 0750    Clinical Impression Statement I was unable to see pt prior to his MD appointment due to me cancelling his appointment here on 12/13/14 due to sick child.  MD has recommended epidural next week and if this does not benefit pt they will discuss surgical options (extending current fusion vs discectomy).  Pt has made progress with PT but he is not content with current level of function and feels as though surgery is inevitable.   PT Next Visit Plan continue current POC for 3 visits  on current POC then PR to MD which will include status after epidural.   Consulted and Agree with Plan of Care Patient        Problem List There are no active problems to display for this patient.   Khyron Garno PT, OCS 12/16/2014, 8:19 AM  [facility] [address]  [place], [place], [address] Phone: [phone]   Fax:  [phone]

## 2014-12-22 ENCOUNTER — Ambulatory Visit: Payer: BLUE CROSS/BLUE SHIELD | Admitting: Physical Therapy

## 2014-12-22 DIAGNOSIS — M5126 Other intervertebral disc displacement, lumbar region: Secondary | ICD-10-CM | POA: Diagnosis not present

## 2014-12-22 DIAGNOSIS — M79604 Pain in right leg: Secondary | ICD-10-CM

## 2014-12-22 DIAGNOSIS — M545 Low back pain: Secondary | ICD-10-CM

## 2014-12-22 NOTE — Therapy (Signed)
[facility] [address]  [place], [place], [address] Phone: [phone]   Fax:  [phone]  Physical Therapy Treatment  Patient Details  Name: Christian Mcintyre MRN: [id number] Date of Birth: [birth year] Referring Provider:  Kary Kos, MD  Encounter Date: 12/22/2014      PT End of Session - 12/22/14 0945    Visit Number 15   Number of Visits 17   Date for PT Re-Evaluation 12/30/14   PT Start Time 0939   PT Stop Time 1015   PT Time Calculation (min) 36 min      No past medical history on file.  Past Surgical History  Procedure Laterality Date  . Lumbar l4/5 fusion in 2008 Bilateral 2008    There were no vitals filed for this visit.  Visit Diagnosis:  LBP radiating to right leg      Subjective Assessment - 12/22/14 0940    Subjective Has epidural scheduled for 12/29/14 and f/u with Dr Saintclair Halsted on 01/13/15 to discuss benefit of injection.  Pt states is feeling good this AM rating 3-4/10.  States able to perform some yardwork (no heavy lifting) noting worse pain 6/10 since last treatment.  No change in N/T - still present bottom of R foot approx 90% of waking hours.   Currently in Pain? Yes   Pain Score --  3-4/10   Pain Location Back   Pain Orientation Right;Lower            [facility] PT Assessment - 12/22/14 0001    AROM   Overall AROM Comments Lumbar Flexion hands to toes, Extension nearly full without c/o increased pain.        TODAY'S TREATMENT NuStep LVL 5, 4'  Bridge 15x  Stretch B Piri and HS and RF/Iliopsoas  Side Bridge elbow and knee 10x each (difficult but no c/o increased pain)  ALT SLR 10x each with Red TB at ankles Diagonal Partial Curl-up 15x each In/Out Abdominal Exercise 10x Staggered Standing one-arm high row 20# 10x each Staggered Standing Punch 20# 10x each SLS Hip Ext double Green TB 10x each with Single Pole A                          PT Long Term Goals -  11/23/14 0919    PT LONG TERM GOAL #2   Title pt independent with advanced HEP as necessary by 12/21/14   Status On-going   PT LONG TERM GOAL #4   Title pt reports able to sit at work up to 2 hours without c/o LBP greater than 2/10 by 12/21/14  notes up to 7/10 pain along with increasing R LE N/T with 2 hours sitting   Status On-going   PT LONG TERM GOAL #5   Title pt able to return to recreational activities to include golfing without limitation by LBP by 12/21/14  is able to participate and typically without noting increased pain however did experience bout of 8/10 following 18 holes golf last week but next 3 days was able to golf without increased pain above baseline (5-6/10)   Status Partially Met               Plan - 12/22/14 1018    Clinical Impression Statement tolerating treatments very well stating notes fatigue without pain   PT Next Visit Plan continue current POC for 2 visits on current POC then PR to MD which will include status  after epidural.        Problem List There are no active problems to display for this patient.   Sylus Stgermain PT, OCS 12/22/2014, 10:20 AM  [facility] [address]  [place], [place], [address] Phone: [phone]   Fax:  [phone]

## 2014-12-24 ENCOUNTER — Ambulatory Visit: Payer: BLUE CROSS/BLUE SHIELD | Admitting: Physical Therapy

## 2014-12-24 DIAGNOSIS — M5126 Other intervertebral disc displacement, lumbar region: Secondary | ICD-10-CM | POA: Diagnosis not present

## 2014-12-24 DIAGNOSIS — M545 Low back pain, unspecified: Secondary | ICD-10-CM

## 2014-12-24 NOTE — Therapy (Signed)
[facility] [address]  [place], [place], [address] Phone: [phone]   Fax:  [phone]  Physical Therapy Treatment  Patient Details  Name: Christian Mcintyre MRN: [id number] Date of Birth: September 24, 1955 Referring Provider:  Kary Kos, MD  Encounter Date: 12/24/2014      PT End of Session - 12/24/14 0812    Visit Number 16   Number of Visits 17   Date for PT Re-Evaluation 12/30/14   PT Start Time 0812   PT Stop Time 0846   PT Time Calculation (min) 34 min      No past medical history on file.  Past Surgical History  Procedure Laterality Date  . Lumbar l4/5 fusion in 2008 Bilateral 2008    There were no vitals filed for this visit.  Visit Diagnosis:  LBP radiating to right leg      Subjective Assessment - 12/24/14 0813    Subjective States exp'ing increased LBP since yesterday (no change to N/T).  States carried some groceries and moved some boxes yesterday evening and noted some pain during this and believes that's why increased pain today.  States felt fine after last treatment without noting increased pain.  Epidural next week (12/29/14) and f/u with MD 01/13/15.  States experiences frequent urge to urinate  but denies loss b/b control.  States this has been present past couple weeks and he experienced these issues in the past prior to last surgery.   Currently in Pain? Yes   Pain Score 6    Pain Location Back   Pain Orientation Right;Lower   Aggravating Factors  prolonged sitting, lift/carry      TODAY'S TREATMENT: Bridge 15x Stretch B Piri and HS and RF/Iliopsoas, B LE Nerve glide LTR 60" Prayer/Cobra combo 10x Quadruped UE/LE 10x Low Row 55# 15x, 65#  Single Hand Low Row 25# 15x Pulldown 55# 15x  Kinesiotape to L-spine (3 stips with 75% strip at L5/S1)  Pt states feels better following treatment today.                             PT Long Term Goals - 12/24/14 0830    PT LONG TERM GOAL #1   Title pt able to demonstrate / verbalize increased physical activity level   Status Achieved   PT LONG TERM GOAL #2   Title pt independent with advanced HEP as necessary by 12/21/14  will adjust following epidural next week   Status On-going   PT LONG TERM GOAL #3   Title standing lumbar Extension AROM to 75% normal   Status Achieved   PT LONG TERM GOAL #4   Title pt reports able to sit at work up to 2 hours without c/o LBP greater than 2/10 by 12/21/14   Status On-going   PT LONG TERM GOAL #5   Title pt able to return to recreational activities to include golfing without limitation by LBP by 12/21/14  continued intermittent difficulty with activities such as yard work and golfing   Status Partially Met               Plan - 12/24/14 0849    Clinical Impression Statement decreased pain with reduced intensity treatment today, applied tape again to assist with further decrease.  Pt to undergo epidural next week and I will re-assess following this.   PT Next Visit Plan re-assess following epidural.   Consulted and Agree with  Plan of Care Patient        Problem List There are no active problems to display for this patient.   Kaitlin Ardito PT, OCS 12/24/2014, 8:50 AM  [facility] [address]  [place], [place], [address] Phone: [phone]   Fax:  [phone]

## 2014-12-27 ENCOUNTER — Ambulatory Visit: Payer: BLUE CROSS/BLUE SHIELD | Admitting: Physical Therapy

## 2014-12-27 DIAGNOSIS — M545 Low back pain, unspecified: Secondary | ICD-10-CM

## 2014-12-27 DIAGNOSIS — M5126 Other intervertebral disc displacement, lumbar region: Secondary | ICD-10-CM | POA: Diagnosis not present

## 2014-12-27 NOTE — Therapy (Signed)
[facility] [address]  [place], [place], [address] Phone: [phone]   Fax:  [phone]  Physical Therapy Treatment  Patient Details  Name: Christian Mcintyre MRN: [id number] Date of Birth: [birth year] Referring Provider:  Kary Kos, MD  Encounter Date: 12/27/2014      PT End of Session - 12/27/14 1322    Visit Number 17   Number of Visits 21   Date for PT Re-Evaluation 01/13/15   PT Start Time 1318   PT Stop Time 1400   PT Time Calculation (min) 42 min      No past medical history on file.  Past Surgical History  Procedure Laterality Date  . Lumbar l4/5 fusion in 2008 Bilateral 2008    There were no vitals filed for this visit.  Visit Diagnosis:  LBP radiating to right leg - Plan: PT plan of care cert/re-cert      Subjective Assessment - 12/27/14 1319    Subjective States felt better following last treatment but soreness in lower back and R LE returned over the weekend and is back to 6/10 today. He's not certain why with increased pain/soreness but states he had to pick up his dog (a full grown lab) some this past weekend.He is scheduled to undergo epidural in 2 days and has f/u with MD on 01/13/15.  He continues to note intermittent buckling sensation to B LE (R>>L).  He state there are days when this does not happen at all but other days will not a few time (had been several times every day in the past).   Currently in Pain? Yes   Pain Score 6    Pain Location Back   Pain Orientation Right;Lower            [facility] PT Assessment - 12/27/14 0001    Balance Screen   Has the patient fallen in the past 6 months No   Functional Tests   Functional tests Squat;Single leg stance   Squat   Comments squats include forward wt shifting with heels lifting off ground.   Single Leg Stance   Comments unsteady B, R limited to 2-3 seconds, L 5-10 seconds   ROM / Strength   AROM / PROM / Strength AROM;Strength   Strength   Overall Strength Comments B LE 5/5 grossly without c/o pain with testing   Special Tests    Special Tests Lumbar   Slump test   Findings Negative   Prone Knee Bend Test   Findings Negative   Straight Leg Raise   Findings Negative     TODAY'S TREATMENT: TherEx - NuStep lvl 5, 3' MMT and assessment Bridge 15x Stretch B HS, Piri, SKTC, Prone Knee Flexion Partial Curl-up 20x Side Bridge 10x each Prone ALT UE/LE 10x Prayer/Cobra combo Quadruped ALT UE 3# / LE 4# 10x each Seated Single Hand Low Row 35# 2x10 each PBall Wall Squat 15x                            PT Long Term Goals - 12/27/14 1323    PT LONG TERM GOAL #1   Title pt able to demonstrate / verbalize increased physical activity level   Status Achieved   PT LONG TERM GOAL #2   Title pt independent with advanced HEP as necessary by 01/13/15  progressing as pt progresses, final HEP will depend on level of function achieved   Status On-going  PT LONG TERM GOAL #3   Title standing lumbar Extension AROM to 75% normal   Status Achieved   PT LONG TERM GOAL #4   Title pt reports able to sit at work up to 2 hours without c/o LBP greater than 2/10 by 01/13/15  limited to 45-60 minutes before notes increasd pain and increased intensity of N/T to R LE.   Status On-going   PT LONG TERM GOAL #5   Title pt able to return to recreational activities to include golfing without limitation by LBP by 12/21/14  intermittent difficulty with yard work and golfing   Status Partially Met               Plan - 12/27/14 1405    Clinical Impression Statement B LE 5/5 without pain.  Lumbar AROM is near full and no c/o pain.  NEG SLR, NEG Slump.  However pt continues to note increased R LE N/T and pain with prolonged sitting and he c/o increased soreness over the past week or so.  His pain had been down to 3/10 range recently but has been 6/10 over the past week.  He's not certain why he's experiencing more  soreness lately but he states he's had to pick up his 100# dog some lately.  Pt is sleeping well without limit by pain.   Pt will benefit from skilled therapeutic intervention in order to improve on the following deficits Pain   Rehab Potential Good   PT Frequency 2x / week  up to 4 more treatments following epidural which is scheduled for 12/29/14 and prior to next MD f/u on 01/13/15.   PT Duration 2 weeks   PT Treatment/Interventions Therapeutic exercise;Manual techniques;Therapeutic activities;Electrical Stimulation   PT Next Visit Plan re-assess following epidural and continue stability/functional mobility training.   Consulted and Agree with Plan of Care Patient        Problem List There are no active problems to display for this patient.   Christian Mcintyre PT, OCS 12/27/2014, 2:20 PM  [facility] [address]  [place], [place], [address] Phone: [phone]   Fax:  [phone]

## 2014-12-30 ENCOUNTER — Ambulatory Visit: Payer: BLUE CROSS/BLUE SHIELD | Admitting: Physical Therapy

## 2014-12-31 ENCOUNTER — Ambulatory Visit: Payer: BLUE CROSS/BLUE SHIELD | Admitting: Physical Therapy

## 2015-01-03 ENCOUNTER — Ambulatory Visit: Payer: BLUE CROSS/BLUE SHIELD | Attending: Neurosurgery | Admitting: Physical Therapy

## 2015-01-03 DIAGNOSIS — M5126 Other intervertebral disc displacement, lumbar region: Secondary | ICD-10-CM | POA: Diagnosis present

## 2015-01-03 DIAGNOSIS — M545 Low back pain, unspecified: Secondary | ICD-10-CM

## 2015-01-03 NOTE — Therapy (Signed)
[facility] [address]  [place], [place], [address] Phone: [phone]   Fax:  [phone]  Physical Therapy Treatment  Patient Details  Name: Christian Mcintyre MRN: [id number] Date of Birth: 12-08-1955 Referring Provider:  Kary Kos, MD  Encounter Date: 01/03/2015      PT End of Session - 01/03/15 1536    Visit Number 18   Number of Visits 21   Date for PT Re-Evaluation 01/13/15   PT Start Time 1532   PT Stop Time 1628   PT Time Calculation (min) 56 min      No past medical history on file.  Past Surgical History  Procedure Laterality Date  . Lumbar l4/5 fusion in 2008 Bilateral 2008    There were no vitals filed for this visit.  Visit Diagnosis:  LBP radiating to right leg      Subjective Assessment - 01/03/15 1537    Subjective Pt underwent epidural on 12/29/14. He states on day following injection LBP was down to 2-3/10 but has been back up to 4/10 ever since 12/31/14.  He states he has not noted any change in R foot N/T since injection.  Pt performed large amount of yardwork this past weekend noting    Currently in Pain? Yes   Pain Score 4    Pain Location Back   Pain Orientation Right;Lower   Multiple Pain Sites No        TODAY'S TREATMENT: TherEx - NuStep lvl 5, 4' Stretch B HS, Piri, and SKTC; R LE Nerve glides Bridge 15x Single Leg Bridge 10x each Supine ALT SLR green TB at ankles 12x each Plank 3x15" Cobra/Prayer combo 6x5" Pulldown 65# 10x In/Out Abdominal Exercise 10x Doorway Lunge 10x each Low Row 75# 10x  3 Strips Kinesiotape L-spine  CP to L-spine with pt prone x 12'          PT Long Term Goals - 12/27/14 1323    PT LONG TERM GOAL #1   Title pt able to demonstrate / verbalize increased physical activity level   Status Achieved   PT LONG TERM GOAL #2   Title pt independent with advanced HEP as necessary by 01/13/15  progressing as pt progresses, final HEP will depend  on level of function achieved   Status On-going   PT LONG TERM GOAL #3   Title standing lumbar Extension AROM to 75% normal   Status Achieved   PT LONG TERM GOAL #4   Title pt reports able to sit at work up to 2 hours without c/o LBP greater than 2/10 by 01/13/15  limited to 45-60 minutes before notes increasd pain and increased intensity of N/T to R LE.   Status On-going   PT LONG TERM GOAL #5   Title pt able to return to recreational activities to include golfing without limitation by LBP by 12/21/14  intermittent difficulty with yard work and golfing   Status Partially Met               Plan - 01/03/15 1635    Clinical Impression Statement slight reduction in pain following epidural (from 6 to 4/10) but no change noted in R LE N/T.  Pt performed well with today's treatment without noting increased LBP.   PT Next Visit Plan pt to MD next week, PR to MD by then.   Consulted and Agree with Plan of Care Patient        Problem List There are  no active problems to display for this patient.   [facility] PT, OCS 01/03/2015, 4:37 PM  [facility] [address]  [place], [place], [address] Phone: [phone]   Fax:  [phone]

## 2015-01-06 ENCOUNTER — Ambulatory Visit: Payer: BLUE CROSS/BLUE SHIELD | Admitting: Physical Therapy

## 2015-01-06 DIAGNOSIS — M545 Low back pain: Secondary | ICD-10-CM

## 2015-01-06 DIAGNOSIS — M79604 Pain in right leg: Secondary | ICD-10-CM

## 2015-01-06 DIAGNOSIS — M5126 Other intervertebral disc displacement, lumbar region: Secondary | ICD-10-CM | POA: Diagnosis not present

## 2015-01-06 NOTE — Therapy (Signed)
[facility] [address]  [address] [place], [place], [address] Phone: [phone]   Fax:  [phone]  Physical Therapy Treatment  Patient Details  Name: Christian Mcintyre MRN: [id number] Date of Birth: 02-14-1956 Referring Provider:  Donalee Citrinram, Gary, MD  Encounter Date: 01/06/2015      PT End of Session - 01/06/15 0723    Visit Number 19   Number of Visits 21   Date for PT Re-Evaluation 01/13/15   PT Start Time 0720   PT Stop Time 0753   PT Time Calculation (min) 33 min      No past medical history on file.  Past Surgical History  Procedure Laterality Date  . Lumbar l4/5 fusion in 2008 Bilateral 2008    There were no vitals filed for this visit.  Visit Diagnosis:  LBP radiating to right leg      Subjective Assessment - 01/06/15 0722    Subjective Denies change in symptoms since last treatment.  No change in N/T or pain stating pain is 4-5/10.  He has f/u with MD 01/13/15 to discuss next step in plan of care.   Currently in Pain? Yes   Pain Score 4    Pain Location Back   Pain Orientation Right;Lower   Multiple Pain Sites No           TODAY'S TREATMENT: TherEx - NuStep lvl 5, 4' Stretch B HS, Piri, and SKTC Bridge 12x TRX Squat 15x Box Lift Floor to Knuckle Box + 20# (35# total) 2x10 (could feel some increased pain during this activity but returned to baseline once stopped, mechanics were good with lifts) TRX Low Row 15x Seated One-Arm Low Row 35# 10x each Pulldown 65# 15x Doorway Lunge 10x each In/Out Abdominal Exercise 12x Cobra/Prayer combo 6x5"              PT Long Term Goals - 01/06/15 0749    PT LONG TERM GOAL #1   Title pt able to demonstrate / verbalize increased physical activity level   Status Achieved   PT LONG TERM GOAL #3   Title standing lumbar Extension AROM to 75% normal   Status Achieved               Plan - 01/06/15 0752    Clinical Impression Statement pt  tolerates exercises well but he notes almost immediate increased LBP with floor level lifts today.  He is quite strong and is able to move relatively heavy wts with push/pull activities and no pain with squat or lunges but the 35# floor level lift increased his back  pain.  He states this seems to be his limit as he notes this with lift / carrying 30-40# of cat or dog food, etc.   PT Next Visit Plan pt to MD next week, PR to MD by then.   Consulted and Agree with Plan of Care Patient        Problem List There are no active problems to display for this patient.   Chitara Clonch PT, OCS 01/06/2015, 8:51 AM  [facility] [address]  [address] [place], [place], [address] Phone: [phone]   Fax:  [phone]

## 2015-01-12 ENCOUNTER — Ambulatory Visit: Payer: BLUE CROSS/BLUE SHIELD | Admitting: Physical Therapy

## 2015-01-19 ENCOUNTER — Ambulatory Visit: Payer: BLUE CROSS/BLUE SHIELD | Admitting: Physical Therapy

## 2015-01-19 DIAGNOSIS — M79604 Pain in right leg: Secondary | ICD-10-CM

## 2015-01-19 DIAGNOSIS — M5126 Other intervertebral disc displacement, lumbar region: Secondary | ICD-10-CM | POA: Diagnosis not present

## 2015-01-19 DIAGNOSIS — M545 Low back pain: Secondary | ICD-10-CM

## 2015-01-19 NOTE — Therapy (Addendum)
[facility] [address]  [place], [place], [address] Phone: [phone]   Fax:  [phone]  Physical Therapy Treatment  Patient Details  Name: Christian Mcintyre MRN: [id number] Date of Birth: November 07, 1955 Referring Provider:  Kary Kos, MD  Encounter Date: 01/19/2015      PT End of Session - 01/19/15 1417    Visit Number 20   Number of Visits 21   Date for PT Re-Evaluation 01/28/15   PT Start Time 1410      No past medical history on file.  Past Surgical History  Procedure Laterality Date  . Lumbar l4/5 fusion in 2008 Bilateral 2008    There were no vitals filed for this visit.  Visit Diagnosis:  LBP radiating to right leg      Subjective Assessment - 01/19/15 1414    Subjective States MD wants to try another epidural which has been scheduled for next week (01/25/15) before discussing surgery.  Pt denies noting further benefit from last injection (pain has been 4-6/10 and no change in N/T).  Reports continues to note increased back pain with carrying/lifting anything in 30-40# range.  States this has pretty much always been his pain threshhold and that this hasn't changed.   Currently in Pain? Yes   Pain Score 4    Pain Location Back   Pain Orientation Right;Lower   Multiple Pain Sites No        TODAY'S TREATMENT:  TherEx - NuStep lvl 5, 4' (not part of billing, unskilled) Software engineer B HS, Piri, and SKTC, R LE Nerve Glides (states feels tight today) Bridge 12x Bridge with ALT Knee Ext 10x TRX Squat 15x 1/2 Kneeling one-arm Row 10# db plus double Blue TB 15x each Pulldown 65# 15x Stiff-Legged Deadlift 10# db 8x (stopped due to noting mild LBP and which extended to B posterior thighs) Prone Alt UE/LE 10x Leg Press 55# 15x Free Squat 10# db 12x 3 strips kinesiotape to L-spine            PT Long Term Goals - 01/06/15 0749    PT LONG TERM GOAL #1   Title pt able to demonstrate / verbalize  increased physical activity level   Status Achieved   PT LONG TERM GOAL #3   Title standing lumbar Extension AROM to 75% normal   Status Achieved               Problem List There are no active problems to display for this patient.   Jenavive Lamboy PT, OCS 01/19/2015, 2:59 PM  [facility] [address]  [place], [place], [address] Phone: [phone]   Fax:  [phone]     PHYSICAL THERAPY DISCHARGE SUMMARY  Visits from Start of Care: 20  Current functional level related to goals / functional outcomes: Some goals met (AROM) but functional goals not met due to pain   Remaining deficits: LBP with lifting   Education / Equipment: HEP and posture/body mechanics Plan: Patient agrees to discharge.  Patient goals were partially met. Patient is being discharged due to not returning since the last visit.  ?????        Mr. Lemelle was last seen on 01/19/15.  He no showed his next appointment and when contacted he states he is scheduled to under l-spine surgery next month.  We are therefore discharging him from our care at this time.  Leonette Most PT, OCS 02/10/2015 2:39 PM

## 2015-01-24 ENCOUNTER — Ambulatory Visit: Payer: BLUE CROSS/BLUE SHIELD | Admitting: Physical Therapy

## 2015-01-27 ENCOUNTER — Ambulatory Visit: Payer: BLUE CROSS/BLUE SHIELD | Admitting: Physical Therapy

## 2015-02-08 ENCOUNTER — Other Ambulatory Visit: Payer: Self-pay | Admitting: Neurosurgery

## 2015-02-17 ENCOUNTER — Other Ambulatory Visit: Payer: Self-pay | Admitting: Neurosurgery

## 2015-02-17 DIAGNOSIS — M5136 Other intervertebral disc degeneration, lumbar region: Secondary | ICD-10-CM

## 2015-03-09 ENCOUNTER — Ambulatory Visit
Admission: RE | Admit: 2015-03-09 | Discharge: 2015-03-09 | Disposition: A | Discharge status: Home / Self Care | Payer: BLUE CROSS/BLUE SHIELD | Source: Ambulatory Visit | Attending: Neurosurgery | Admitting: Neurosurgery

## 2015-03-09 ENCOUNTER — Other Ambulatory Visit (HOSPITAL_COMMUNITY): Payer: Self-pay | Admitting: *Deleted

## 2015-03-09 ENCOUNTER — Encounter (HOSPITAL_COMMUNITY): Payer: Self-pay

## 2015-03-09 ENCOUNTER — Encounter (HOSPITAL_COMMUNITY)
Admission: RE | Admit: 2015-03-09 | Discharge: 2015-03-09 | Disposition: A | Discharge status: Home / Self Care | Payer: BLUE CROSS/BLUE SHIELD | Source: Ambulatory Visit | Attending: Neurosurgery | Admitting: Neurosurgery

## 2015-03-09 DIAGNOSIS — M5136 Other intervertebral disc degeneration, lumbar region: Secondary | ICD-10-CM

## 2015-03-09 DIAGNOSIS — Z01812 Encounter for preprocedural laboratory examination: Secondary | ICD-10-CM | POA: Insufficient documentation

## 2015-03-09 HISTORY — DX: Sleep apnea, unspecified: G47.30

## 2015-03-09 HISTORY — DX: Essential (primary) hypertension: I10

## 2015-03-09 LAB — BASIC METABOLIC PANEL
ANION GAP: 8 (ref 5–15)
BUN: 5 mg/dL — ABNORMAL LOW (ref 6–20)
CALCIUM: 9.5 mg/dL (ref 8.9–10.3)
CO2: 28 mmol/L (ref 22–32)
Chloride: 103 mmol/L (ref 101–111)
Creatinine, Ser: 0.93 mg/dL (ref 0.61–1.24)
GFR calc Af Amer: >60 mL/min (ref >60)
GFR calc non Af Amer: >60 mL/min (ref >60)
GLUCOSE: 102 mg/dL — AB (ref 65–99)
Potassium: 3.8 mmol/L (ref 3.5–5.1)
Sodium: 139 mmol/L (ref 135–145)

## 2015-03-09 LAB — CBC
HCT: 44.4 % (ref 39.0–52.0)
Hemoglobin: 15 g/dL (ref 13.0–17.0)
MCH: 33.3 pg (ref 26.0–34.0)
MCHC: 33.8 g/dL (ref 30.0–36.0)
MCV: 98.7 fL (ref 78.0–100.0)
Platelets: 229 10*3/uL (ref 150–400)
RBC: 4.5 MIL/uL (ref 4.22–5.81)
RDW: 12.6 % (ref 11.5–15.5)
WBC: 4.7 10*3/uL (ref 4.0–10.5)

## 2015-03-09 LAB — SURGICAL PCR SCREEN
MRSA, PCR: NEGATIVE
Staphylococcus aureus: NEGATIVE

## 2015-03-09 NOTE — Pre-Procedure Instructions (Signed)
    Christian Mcintyre  03/09/2015      Your procedure is scheduled on Wednesday, March 16, 2015 at 12:00 PM.   [facility] at 10:00 AM.   Call this number if you have problems the morning of surgery: [phone]   Any questions prior to day of surgery, please call [phone] between 8 & 4 PM.    Remember:  Do not eat food or drink liquids after midnight Tuesday, 03/15/15.  Take these medicines the morning of surgery with A SIP OF WATER: None   Do not wear jewelry.  Do not wear lotions, powders, or cologne.  You may wear deodorant.  Men may shave face and neck.  Do not bring valuables to the hospital.  [facility] is not responsible for any belongings or valuables.  Contacts, dentures or bridgework may not be worn into surgery.  Leave your suitcase in the car.  After surgery it may be brought to your room.  For patients admitted to the hospital, discharge time will be determined by your treatment team.  Special instructions:  See "Preparing for Surgery" Instruction sheet.   Please read over the following fact sheets that you were given. Pain Booklet, Coughing and Deep Breathing, Blood Transfusion Information, MRSA Information and Surgical Site Infection Prevention

## 2015-03-16 ENCOUNTER — Inpatient Hospital Stay (HOSPITAL_COMMUNITY): Payer: BLUE CROSS/BLUE SHIELD | Admitting: Certified Registered Nurse Anesthetist

## 2015-03-16 ENCOUNTER — Inpatient Hospital Stay (HOSPITAL_COMMUNITY): Payer: BLUE CROSS/BLUE SHIELD

## 2015-03-16 ENCOUNTER — Encounter (HOSPITAL_COMMUNITY)
Admission: RE | Disposition: A | Discharge status: Home / Self Care | Payer: Self-pay | Source: Ambulatory Visit | Attending: Neurosurgery

## 2015-03-16 ENCOUNTER — Inpatient Hospital Stay (HOSPITAL_COMMUNITY)
Admission: RE | Admit: 2015-03-16 | Discharge: 2015-03-18 | DRG: 460 | Disposition: A | Discharge status: Home / Self Care | Payer: BLUE CROSS/BLUE SHIELD | Source: Ambulatory Visit | Attending: Neurosurgery | Admitting: Neurosurgery

## 2015-03-16 DIAGNOSIS — M5117 Intervertebral disc disorders with radiculopathy, lumbosacral region: Principal | ICD-10-CM | POA: Diagnosis present

## 2015-03-16 DIAGNOSIS — G4730 Sleep apnea, unspecified: Secondary | ICD-10-CM | POA: Diagnosis present

## 2015-03-16 DIAGNOSIS — M4806 Spinal stenosis, lumbar region: Secondary | ICD-10-CM | POA: Diagnosis present

## 2015-03-16 DIAGNOSIS — Z6832 Body mass index (BMI) 32.0-32.9, adult: Secondary | ICD-10-CM

## 2015-03-16 DIAGNOSIS — M48061 Spinal stenosis, lumbar region without neurogenic claudication: Secondary | ICD-10-CM | POA: Diagnosis present

## 2015-03-16 DIAGNOSIS — I10 Essential (primary) hypertension: Secondary | ICD-10-CM | POA: Diagnosis present

## 2015-03-16 DIAGNOSIS — Z87891 Personal history of nicotine dependence: Secondary | ICD-10-CM

## 2015-03-16 DIAGNOSIS — M549 Dorsalgia, unspecified: Secondary | ICD-10-CM | POA: Diagnosis present

## 2015-03-16 DIAGNOSIS — Z419 Encounter for procedure for purposes other than remedying health state, unspecified: Secondary | ICD-10-CM

## 2015-03-16 LAB — TYPE AND SCREEN
ABO/RH(D): A POS
Antibody Screen: NEGATIVE

## 2015-03-16 LAB — ABO/RH: ABO/RH(D): A POS

## 2015-03-16 SURGERY — POSTERIOR LUMBAR FUSION 2 LEVEL
Anesthesia: General | Site: Back

## 2015-03-16 MED ORDER — 0.9 % SODIUM CHLORIDE (POUR BTL) OPTIME
TOPICAL | Status: DC | PRN
  Administered 2015-03-16: 1000 mL

## 2015-03-16 MED ORDER — HYDROCHLOROTHIAZIDE 12.5 MG PO CAPS
12.5000 mg | ORAL_CAPSULE | Freq: Every day | ORAL | Status: DC
  Administered 2015-03-16 – 2015-03-18 (×3): 12.5 mg via ORAL
  Filled 2015-03-16 (×3): qty 1

## 2015-03-16 MED ORDER — SODIUM CHLORIDE 0.9 % IJ SOLN: 3.0000 mL | INTRAMUSCULAR | Status: DC | PRN

## 2015-03-16 MED ORDER — LIDOCAINE-EPINEPHRINE 1 %-1:100000 IJ SOLN
INTRAMUSCULAR | Status: DC | PRN
Start: 2015-03-16 — End: 2015-03-16
  Administered 2015-03-16: 5 mL

## 2015-03-16 MED ORDER — PROPOFOL 10 MG/ML IV BOLUS
INTRAVENOUS | Status: DC | PRN
  Administered 2015-03-16: 200 mg via INTRAVENOUS

## 2015-03-16 MED ORDER — HYDROMORPHONE HCL 1 MG/ML IJ SOLN
0.2500 mg | INTRAMUSCULAR | Status: DC | PRN
  Administered 2015-03-16: 0.5 mg via INTRAVENOUS

## 2015-03-16 MED ORDER — IRBESARTAN 300 MG PO TABS
300.0000 mg | ORAL_TABLET | Freq: Every day | ORAL | Status: DC
  Administered 2015-03-16 – 2015-03-18 (×3): 300 mg via ORAL
  Filled 2015-03-16 (×3): qty 1

## 2015-03-16 MED ORDER — PHENYLEPHRINE HCL 10 MG/ML IJ SOLN
INTRAMUSCULAR | Status: AC
  Filled 2015-03-16: qty 1

## 2015-03-16 MED ORDER — SODIUM CHLORIDE 0.9 % IV SOLN: 250.0000 mL | INTRAVENOUS | Status: DC

## 2015-03-16 MED ORDER — MEPERIDINE HCL 25 MG/ML IJ SOLN: 6.2500 mg | INTRAMUSCULAR | Status: DC | PRN

## 2015-03-16 MED ORDER — ROCURONIUM BROMIDE 50 MG/5ML IV SOLN
INTRAVENOUS | Status: AC
  Filled 2015-03-16: qty 1

## 2015-03-16 MED ORDER — CYCLOBENZAPRINE HCL 10 MG PO TABS
10.0000 mg | ORAL_TABLET | Freq: Three times a day (TID) | ORAL | Status: DC | PRN
  Administered 2015-03-16: 10 mg via ORAL
  Filled 2015-03-16 (×3): qty 1

## 2015-03-16 MED ORDER — LACTATED RINGERS IV SOLN
INTRAVENOUS | Status: DC
  Administered 2015-03-16 (×6): via INTRAVENOUS

## 2015-03-16 MED ORDER — BUPIVACAINE LIPOSOME 1.3 % IJ SUSP
20.0000 mL | Freq: Once | INTRAMUSCULAR | Status: DC
  Filled 2015-03-16 (×2): qty 20

## 2015-03-16 MED ORDER — NEOSTIGMINE METHYLSULFATE 10 MG/10ML IV SOLN
INTRAVENOUS | Status: DC | PRN
  Administered 2015-03-16: 4 mg via INTRAVENOUS

## 2015-03-16 MED ORDER — ONDANSETRON HCL 4 MG/2ML IJ SOLN: 4.0000 mg | INTRAMUSCULAR | Status: DC | PRN

## 2015-03-16 MED ORDER — BUPIVACAINE HCL (PF) 0.25 % IJ SOLN
INTRAMUSCULAR | Status: DC | PRN
  Administered 2015-03-16: 5 mL

## 2015-03-16 MED ORDER — PROMETHAZINE HCL 25 MG/ML IJ SOLN: 6.2500 mg | INTRAMUSCULAR | Status: DC | PRN

## 2015-03-16 MED ORDER — LIDOCAINE HCL (CARDIAC) 20 MG/ML IV SOLN
INTRAVENOUS | Status: DC | PRN
  Administered 2015-03-16: 20 mg via INTRAVENOUS

## 2015-03-16 MED ORDER — FENTANYL CITRATE (PF) 250 MCG/5ML IJ SOLN
INTRAMUSCULAR | Status: AC
  Filled 2015-03-16: qty 5

## 2015-03-16 MED ORDER — PROPOFOL 10 MG/ML IV BOLUS
INTRAVENOUS | Status: AC
  Filled 2015-03-16: qty 20

## 2015-03-16 MED ORDER — VANCOMYCIN HCL 1000 MG IV SOLR
INTRAVENOUS | Status: DC | PRN
  Administered 2015-03-16: 1000 mg via TOPICAL

## 2015-03-16 MED ORDER — LIDOCAINE HCL (CARDIAC) 20 MG/ML IV SOLN
INTRAVENOUS | Status: AC
  Filled 2015-03-16: qty 5

## 2015-03-16 MED ORDER — MIDAZOLAM HCL 5 MG/5ML IJ SOLN
INTRAMUSCULAR | Status: DC | PRN
  Administered 2015-03-16: 2 mg via INTRAVENOUS

## 2015-03-16 MED ORDER — BUPIVACAINE LIPOSOME 1.3 % IJ SUSP
INTRAMUSCULAR | Status: DC | PRN
  Administered 2015-03-16: 20 mL

## 2015-03-16 MED ORDER — THROMBIN 20000 UNITS EX SOLR
CUTANEOUS | Status: DC | PRN
  Administered 2015-03-16 (×2): 20 mL via TOPICAL

## 2015-03-16 MED ORDER — SODIUM CHLORIDE 0.9 % IR SOLN
Status: DC | PRN
  Administered 2015-03-16: 500 mL

## 2015-03-16 MED ORDER — FENTANYL CITRATE (PF) 100 MCG/2ML IJ SOLN
INTRAMUSCULAR | Status: DC | PRN
  Administered 2015-03-16: 250 ug via INTRAVENOUS
  Administered 2015-03-16 (×2): 50 ug via INTRAVENOUS
  Administered 2015-03-16: 150 ug via INTRAVENOUS
  Administered 2015-03-16: 100 ug via INTRAVENOUS
  Administered 2015-03-16: 50 ug via INTRAVENOUS
  Administered 2015-03-16: 100 ug via INTRAVENOUS

## 2015-03-16 MED ORDER — DOCUSATE SODIUM 100 MG PO CAPS
100.0000 mg | ORAL_CAPSULE | Freq: Two times a day (BID) | ORAL | Status: DC
  Administered 2015-03-16 – 2015-03-18 (×3): 100 mg via ORAL
  Filled 2015-03-16 (×4): qty 1

## 2015-03-16 MED ORDER — HYDROMORPHONE HCL 1 MG/ML IJ SOLN
INTRAMUSCULAR | Status: AC
  Administered 2015-03-16: 0.5 mg via INTRAVENOUS
  Filled 2015-03-16: qty 1

## 2015-03-16 MED ORDER — ACETAMINOPHEN 325 MG PO TABS: 650.0000 mg | ORAL_TABLET | ORAL | Status: DC | PRN

## 2015-03-16 MED ORDER — ONDANSETRON HCL 4 MG/2ML IJ SOLN
INTRAMUSCULAR | Status: AC
  Filled 2015-03-16: qty 2

## 2015-03-16 MED ORDER — HYDROMORPHONE HCL 1 MG/ML IJ SOLN
0.5000 mg | INTRAMUSCULAR | Status: DC | PRN
  Administered 2015-03-16 – 2015-03-17 (×2): 1 mg via INTRAVENOUS
  Filled 2015-03-16 (×2): qty 1

## 2015-03-16 MED ORDER — MIDAZOLAM HCL 2 MG/2ML IJ SOLN
INTRAMUSCULAR | Status: AC
  Filled 2015-03-16: qty 2

## 2015-03-16 MED ORDER — ROCURONIUM BROMIDE 100 MG/10ML IV SOLN
INTRAVENOUS | Status: DC | PRN
Start: 2015-03-16 — End: 2015-03-16
  Administered 2015-03-16: 40 mg via INTRAVENOUS
  Administered 2015-03-16 (×2): 50 mg via INTRAVENOUS
  Administered 2015-03-16 (×2): 10 mg via INTRAVENOUS

## 2015-03-16 MED ORDER — ONDANSETRON HCL 4 MG/2ML IJ SOLN
INTRAMUSCULAR | Status: DC | PRN
  Administered 2015-03-16: 4 mg via INTRAVENOUS

## 2015-03-16 MED ORDER — ARTIFICIAL TEARS OP OINT
TOPICAL_OINTMENT | OPHTHALMIC | Status: AC
  Filled 2015-03-16: qty 3.5

## 2015-03-16 MED ORDER — MIDAZOLAM HCL 2 MG/2ML IJ SOLN: 0.5000 mg | Freq: Once | INTRAMUSCULAR | Status: DC | PRN

## 2015-03-16 MED ORDER — ACETAMINOPHEN 650 MG RE SUPP: 650.0000 mg | RECTAL | Status: DC | PRN

## 2015-03-16 MED ORDER — PHENYLEPHRINE HCL 10 MG/ML IJ SOLN
INTRAMUSCULAR | Status: DC | PRN
  Administered 2015-03-16: 40 ug via INTRAVENOUS
  Administered 2015-03-16 (×3): 80 ug via INTRAVENOUS
  Administered 2015-03-16: 40 ug via INTRAVENOUS
  Administered 2015-03-16: 80 ug via INTRAVENOUS

## 2015-03-16 MED ORDER — VALSARTAN-HYDROCHLOROTHIAZIDE 320-12.5 MG PO TABS: 1.0000 | ORAL_TABLET | Freq: Every day | ORAL | Status: DC

## 2015-03-16 MED ORDER — CEFAZOLIN SODIUM 1-5 GM-% IV SOLN
1.0000 g | Freq: Three times a day (TID) | INTRAVENOUS | Status: AC
  Administered 2015-03-16 – 2015-03-17 (×2): 1 g via INTRAVENOUS
  Filled 2015-03-16 (×2): qty 50

## 2015-03-16 MED ORDER — ALUM & MAG HYDROXIDE-SIMETH 200-200-20 MG/5ML PO SUSP: 30.0000 mL | Freq: Four times a day (QID) | ORAL | Status: DC | PRN

## 2015-03-16 MED ORDER — CYCLOBENZAPRINE HCL 10 MG PO TABS
ORAL_TABLET | ORAL | Status: AC
  Administered 2015-03-16: 10 mg via ORAL
  Filled 2015-03-16: qty 1

## 2015-03-16 MED ORDER — OXYCODONE-ACETAMINOPHEN 5-325 MG PO TABS
1.0000 | ORAL_TABLET | ORAL | Status: DC | PRN
  Administered 2015-03-17 – 2015-03-18 (×6): 2 via ORAL
  Administered 2015-03-18: 1 via ORAL
  Administered 2015-03-18: 2 via ORAL
  Filled 2015-03-16 (×8): qty 2

## 2015-03-16 MED ORDER — PHENOL 1.4 % MT LIQD: 1.0000 | OROMUCOSAL | Status: DC | PRN

## 2015-03-16 MED ORDER — CEFAZOLIN SODIUM-DEXTROSE 2-3 GM-% IV SOLR
INTRAVENOUS | Status: DC | PRN
  Administered 2015-03-16 (×2): 2 g via INTRAVENOUS

## 2015-03-16 MED ORDER — MENTHOL 3 MG MT LOZG: 1.0000 | LOZENGE | OROMUCOSAL | Status: DC | PRN

## 2015-03-16 MED ORDER — SODIUM CHLORIDE 0.9 % IJ SOLN
3.0000 mL | Freq: Two times a day (BID) | INTRAMUSCULAR | Status: DC
  Administered 2015-03-17 – 2015-03-18 (×2): 3 mL via INTRAVENOUS

## 2015-03-16 MED ORDER — VANCOMYCIN HCL 1000 MG IV SOLR
INTRAVENOUS | Status: AC
Start: 2015-03-16 — End: 2015-03-17
  Filled 2015-03-16: qty 1000

## 2015-03-16 MED ORDER — GLYCOPYRROLATE 0.2 MG/ML IJ SOLN
INTRAMUSCULAR | Status: DC | PRN
  Administered 2015-03-16: 0.4 mg via INTRAVENOUS

## 2015-03-16 SURGICAL SUPPLY — 75 items
BAG DECANTER FOR FLEXI CONT (MISCELLANEOUS) ×3 IMPLANT
BENZOIN TINCTURE PRP APPL 2/3 (GAUZE/BANDAGES/DRESSINGS) ×3 IMPLANT
BLADE CLIPPER SURG (BLADE) ×3 IMPLANT
BLADE SURG 11 STRL SS (BLADE) ×3 IMPLANT
BRUSH SCRUB EZ PLAIN DRY (MISCELLANEOUS) ×3 IMPLANT
BUR MATCHSTICK NEURO 3.0 LAGG (BURR) ×3 IMPLANT
BUR PRECISION FLUTE 6.0 (BURR) ×3 IMPLANT
CAGE RISE 11-17-15 10X22 (Cage) ×6 IMPLANT
CAGE RISE 11-17-15 10X26 (Cage) ×6 IMPLANT
CANISTER SUCT 3000ML PPV (MISCELLANEOUS) ×3 IMPLANT
CAP LOCKING (Cap) ×12 IMPLANT
CAP LOCKING 5.5 CREO (Cap) ×6 IMPLANT
CLOSURE WOUND 1/2 X4 (GAUZE/BANDAGES/DRESSINGS) ×2
CONT SPEC 4OZ CLIKSEAL STRL BL (MISCELLANEOUS) ×6 IMPLANT
COVER BACK TABLE 60X90IN (DRAPES) ×3 IMPLANT
DECANTER SPIKE VIAL GLASS SM (MISCELLANEOUS) ×3 IMPLANT
DRAPE C-ARM 42X72 X-RAY (DRAPES) ×3 IMPLANT
DRAPE C-ARMOR (DRAPES) ×3 IMPLANT
DRAPE LAPAROTOMY 100X72X124 (DRAPES) ×3 IMPLANT
DRAPE POUCH INSTRU U-SHP 10X18 (DRAPES) ×3 IMPLANT
DRAPE PROXIMA HALF (DRAPES) ×3 IMPLANT
DRAPE SURG 17X23 STRL (DRAPES) ×3 IMPLANT
DRSG OPSITE 4X5.5 SM (GAUZE/BANDAGES/DRESSINGS) IMPLANT
DRSG OPSITE POSTOP 4X8 (GAUZE/BANDAGES/DRESSINGS) ×3 IMPLANT
DURAPREP 26ML APPLICATOR (WOUND CARE) ×3 IMPLANT
ELECT REM PT RETURN 9FT ADLT (ELECTROSURGICAL) ×3
ELECTRODE REM PT RTRN 9FT ADLT (ELECTROSURGICAL) ×1 IMPLANT
EVACUATOR 3/16  PVC DRAIN (DRAIN)
EVACUATOR 3/16 PVC DRAIN (DRAIN) IMPLANT
GAUZE SPONGE 4X4 12PLY STRL (GAUZE/BANDAGES/DRESSINGS) ×3 IMPLANT
GAUZE SPONGE 4X4 16PLY XRAY LF (GAUZE/BANDAGES/DRESSINGS) ×3 IMPLANT
GLOVE BIO SURGEON STRL SZ 6.5 (GLOVE) ×4 IMPLANT
GLOVE BIO SURGEON STRL SZ7 (GLOVE) ×6 IMPLANT
GLOVE BIO SURGEON STRL SZ7.5 (GLOVE) ×9 IMPLANT
GLOVE BIO SURGEON STRL SZ8 (GLOVE) ×9 IMPLANT
GLOVE BIO SURGEONS STRL SZ 6.5 (GLOVE) ×2
GLOVE BIOGEL PI IND STRL 7.0 (GLOVE) ×2 IMPLANT
GLOVE BIOGEL PI IND STRL 7.5 (GLOVE) ×3 IMPLANT
GLOVE BIOGEL PI IND STRL 8 (GLOVE) ×2 IMPLANT
GLOVE BIOGEL PI INDICATOR 7.0 (GLOVE) ×4
GLOVE BIOGEL PI INDICATOR 7.5 (GLOVE) ×6
GLOVE BIOGEL PI INDICATOR 8 (GLOVE) ×4
GLOVE INDICATOR 8.5 STRL (GLOVE) ×9 IMPLANT
GOWN STRL REUS W/ TWL LRG LVL3 (GOWN DISPOSABLE) ×2 IMPLANT
GOWN STRL REUS W/ TWL XL LVL3 (GOWN DISPOSABLE) ×2 IMPLANT
GOWN STRL REUS W/TWL 2XL LVL3 (GOWN DISPOSABLE) ×6 IMPLANT
GOWN STRL REUS W/TWL LRG LVL3 (GOWN DISPOSABLE) ×4
GOWN STRL REUS W/TWL XL LVL3 (GOWN DISPOSABLE) ×4
KIT BASIN OR (CUSTOM PROCEDURE TRAY) ×3 IMPLANT
KIT INFUSE XX SMALL 0.7CC (Orthopedic Implant) ×3 IMPLANT
KIT ROOM TURNOVER OR (KITS) ×3 IMPLANT
LIQUID BAND (GAUZE/BANDAGES/DRESSINGS) ×3 IMPLANT
NEEDLE HYPO 25X1 1.5 SAFETY (NEEDLE) ×3 IMPLANT
NS IRRIG 1000ML POUR BTL (IV SOLUTION) ×3 IMPLANT
PACK LAMINECTOMY NEURO (CUSTOM PROCEDURE TRAY) ×3 IMPLANT
PAD ARMBOARD 7.5X6 YLW CONV (MISCELLANEOUS) ×9 IMPLANT
PATTIES SURGICAL 1X1 (DISPOSABLE) ×3 IMPLANT
PUTTY BONE DBX 5CC MIX (Putty) ×3 IMPLANT
ROD 100MM (Rod) ×4 IMPLANT
ROD SPNL STRG 100X5.5XNS (Rod) ×2 IMPLANT
SCREW 6.0X50/40 PRE (Screw) ×12 IMPLANT
SCREW MOD 6.0-5.0X35MM (Screw) ×6 IMPLANT
SPONGE LAP 4X18 X RAY DECT (DISPOSABLE) IMPLANT
SPONGE SURGIFOAM ABS GEL 100 (HEMOSTASIS) ×6 IMPLANT
STRIP CLOSURE SKIN 1/2X4 (GAUZE/BANDAGES/DRESSINGS) ×4 IMPLANT
SUT VIC AB 0 CT1 18XCR BRD8 (SUTURE) ×2 IMPLANT
SUT VIC AB 0 CT1 8-18 (SUTURE) ×4
SUT VIC AB 2-0 CT1 18 (SUTURE) ×6 IMPLANT
SUT VICRYL 4-0 PS2 18IN ABS (SUTURE) ×3 IMPLANT
SYR 20ML ECCENTRIC (SYRINGE) ×3 IMPLANT
TOWEL OR 17X24 6PK STRL BLUE (TOWEL DISPOSABLE) ×3 IMPLANT
TOWEL OR 17X26 10 PK STRL BLUE (TOWEL DISPOSABLE) ×3 IMPLANT
TRAY FOLEY W/METER SILVER 14FR (SET/KITS/TRAYS/PACK) IMPLANT
TULIP CREP AMP 5.5MM (Orthopedic Implant) ×6 IMPLANT
WATER STERILE IRR 1000ML POUR (IV SOLUTION) ×3 IMPLANT

## 2015-03-16 NOTE — Anesthesia Procedure Notes (Signed)
Procedure Name: Intubation Date/Time: 03/16/2015 1:12 PM Performed by: Reine JustFLOWERS, Aleynah Rocchio T Pre-anesthesia Checklist: Patient identified, Timeout performed, Emergency Drugs available, Suction available and Patient being monitored Patient Re-evaluated:Patient Re-evaluated prior to inductionOxygen Delivery Method: Circle system utilized and Simple face mask Preoxygenation: Pre-oxygenation with 100% oxygen Intubation Type: Combination inhalational/ intravenous induction Ventilation: Mask ventilation without difficulty and Oral airway inserted - appropriate to patient size Laryngoscope Size: Miller and 3 Grade View: Grade I Tube type: Oral Tube size: 7.5 mm Number of attempts: 1 Airway Equipment and Method: Patient positioned with wedge pillow and Stylet Placement Confirmation: ETT inserted through vocal cords under direct vision,  positive ETCO2 and breath sounds checked- equal and bilateral Secured at: 23 cm Tube secured with: Tape Dental Injury: Teeth and Oropharynx as per pre-operative assessment

## 2015-03-16 NOTE — Anesthesia Postprocedure Evaluation (Signed)
  Anesthesia Post-op Note  Patient: Christian Mcintyre  Procedure(s) Performed: Procedure(s): LUMBAR THREE-FOUR, LUMBAR FIVE, SACRAL ONE POSTERIOR LUMBAR FUSION  (N/A)  Patient Location: PACU  Anesthesia Type:General  Level of Consciousness: awake, alert  and oriented  Airway and Oxygen Therapy: Patient Spontanous Breathing  Post-op Pain: mild  Post-op Assessment: Post-op Vital signs reviewed     RLE Motor Response: Purposeful movement, Responds to commands        Post-op Vital Signs: Reviewed  Last Vitals:  Filed Vitals:   03/16/15 2015  BP:   Pulse: 77  Temp: 36.9 C  Resp: 11    Complications: No apparent anesthesia complications

## 2015-03-16 NOTE — Transfer of Care (Signed)
Immediate Anesthesia Transfer of Care Note  Patient: Christian Mcintyre  Procedure(s) Performed: Procedure(s): LUMBAR THREE-FOUR, LUMBAR FIVE, SACRAL ONE POSTERIOR LUMBAR FUSION  (N/A)  Patient Location: PACU  Anesthesia Type:General  Level of Consciousness: awake, alert  and oriented  Airway & Oxygen Therapy: Patient Spontanous Breathing and Patient connected to face mask oxygen  Post-op Assessment: Report given to RN and Post -op Vital signs reviewed and stable  Post vital signs: Reviewed and stable  Last Vitals:  Filed Vitals:   03/16/15 1044  BP: 156/90  Pulse: 77  Temp: 36.7 C  Resp: 20    Complications: No apparent anesthesia complications

## 2015-03-16 NOTE — Anesthesia Preprocedure Evaluation (Addendum)
Anesthesia Evaluation  Patient identified by MRN, date of birth, ID band Patient awake    Reviewed: Allergy & Precautions, NPO status , Patient's Chart, lab work & pertinent test results  History of Anesthesia Complications Negative for: history of anesthetic complications  Airway Mallampati: III  TM Distance: >3 FB Neck ROM: Full    Dental  (+) Teeth Intact, Dental Advisory Given, Caps   Pulmonary sleep apnea and Continuous Positive Airway Pressure Ventilation , former smoker (quit 1999),  breath sounds clear to auscultation        Cardiovascular hypertension, Pt. on medications - anginaRhythm:Regular Rate:Normal     Neuro/Psych Chronic back pain: narcotics    GI/Hepatic negative GI ROS, Neg liver ROS,   Endo/Other  Morbid obesity  Renal/GU negative Renal ROS     Musculoskeletal   Abdominal (+) + obese,   Peds  Hematology negative hematology ROS (+)   Anesthesia Other Findings   Reproductive/Obstetrics                            Anesthesia Physical Anesthesia Plan  ASA: III  Anesthesia Plan: General   Post-op Pain Management:    Induction: Intravenous  Airway Management Planned: Oral ETT and Video Laryngoscope Planned  Additional Equipment:   Intra-op Plan:   Post-operative Plan: Extubation in OR  Informed Consent: I have reviewed the patients History and Physical, chart, labs and discussed the procedure including the risks, benefits and alternatives for the proposed anesthesia with the patient or authorized representative who has indicated his/her understanding and acceptance.   Dental advisory given  Plan Discussed with: CRNA and Surgeon  Anesthesia Plan Comments: (Plan routine monitors, GETA )        Anesthesia Quick Evaluation

## 2015-03-16 NOTE — H&P (Signed)
Christian Mcintyre is an 59 y.o. male.   Chief Complaint: Back and bilateral leg pain HPI: Patient is a 59 year old gentleman is a progress worsening back and leg pain for the last several weeks and months refractory to all forms of conservative treatment. He previously undergone L4-5 fusion he's had progressive breakdown at L3-4 and L5-S1 most of his symptoms now are in the L5-S1 division however he does have significant mechanical back pain and marked facet arthropathy at both those levels. Due to patient's failed conservative treatment imaging findings and progression of clinical syndrome I recommended decompressive laminectomies at L3-4 and L5-S1 with her body fusions at those levels. We'll evaluate the L4-5 fusion possibly take out the facet screws at L4-5 versus leaving them behind. I've extensively gone over the risks and benefits of this operation with him as well as perioperative course expectations of outcome and alternatives of surgery and he understands and agrees to proceed forward.  Past Medical History  Diagnosis Date  . Hypertension   . Sleep apnea     cpap    Past Surgical History  Procedure Laterality Date  . Lumbar l4/5 fusion in 2008 Bilateral 2008  . Back surgery      x2    No family history on file. Social History:  reports that he quit smoking about 16 years ago. He does not have any smokeless tobacco history on file. He reports that he drinks alcohol. He reports that he does not use illicit drugs.  Allergies: No Known Allergies  Medications Prior to Admission  Medication Sig Dispense Refill  . valsartan-hydrochlorothiazide (DIOVAN-HCT) 320-12.5 MG per tablet Take 1 tablet by mouth daily.      Results for orders placed or performed during the hospital encounter of 03/16/15 (from the past 48 hour(s))  Type and screen     Status: None   Collection Time: 03/16/15 10:55 AM  Result Value Ref Range   ABO/RH(D) A POS    Antibody Screen NEG    Sample Expiration 03/19/2015     No results found.  Review of Systems  Constitutional: Negative.   HENT: Negative.   Eyes: Negative.   Respiratory: Negative.   Cardiovascular: Negative.   Gastrointestinal: Negative.   Genitourinary: Negative.   Musculoskeletal: Positive for myalgias and back pain.  Skin: Negative.   Neurological: Positive for tingling and sensory change.  Psychiatric/Behavioral: Negative.     Blood pressure 156/90, pulse 77, temperature 98.1 F (36.7 C), resp. rate 20, SpO2 96 %. Physical Exam  Constitutional: He is oriented to person, place, and time. He appears well-developed and well-nourished.  HENT:  Head: Normocephalic.  Eyes: Pupils are equal, round, and reactive to light.  Neck: Normal range of motion.  Respiratory: Effort normal and breath sounds normal.  GI: Soft. Bowel sounds are normal.  Neurological: He is alert and oriented to person, place, and time. He has normal strength. GCS eye subscore is 4. GCS verbal subscore is 5. GCS motor subscore is 6.  Hent is 5 out of 5 in his iliopsoas, quads, hamstrings, gases, into tibialis, and EHL.     Assessment/Plan 59 year old gentleman presents for an L3-4 and L5-S1 posterior lumbar interbody fusion.  [name] 03/16/2015, 12:22 PM

## 2015-03-16 NOTE — Op Note (Signed)
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  #2 posterior lumbar interbody fusion L3-4 and L5-S1 utilizing the globus expandable arise cage system packed with locally harvested autograft mixed with DBX mix and BMP  #3 nonsegmental cortical screw fixation using the globusCreo modular 5.5 cortical screw set with 60/50 screws at L3, L4, and S1 bilaterally  #4 exploration of fusion removal of hardware L4-5  Surgeon: Jillyn Hidden Vannessa Godown  Asst.: Hilda Lias  Anesthesia: Gen.  EBL: Minimal  History of present illness: Patient is a very pleasant 59 year old gentleman previously undergone L4-5 fusion and did fairly well for many years. He is a progress worsening back and bilateral leg pain refractory to all forms of conservative treatment with anti-inflammatory pain management and epidural steroidal injections. Imaging revealed severe spinal stenosis at L3-4 and L5-S1 with large central disc herniations and evidence of instability with diastased of his facet joints above and below a previous L4-5 solid fusion. Due to patient's failure conservative treatment imaging findings and progression of clinical syndrome I recommended decompression stabilization procedure at L3-4 and L5-S1. I extensively went over the risks and benefits of the operation with the patient as well as perioperative course expectations of outcome and alternatives of surgery and he understands and agrees to proceed forward.  Operative procedure: Patient brought into the or was induced under general anesthesia positioned prone on flat  rolls his back was prepped and draped in routine sterile fashion is old incision was opened up and the scar tissues dissected off of the fusion mass at L4-5 extending cephalad caudally exposing the spinous processes at L3 L5 and S1. Then after expose the pars and the entry points to the L3 pedicle for cortical screw placement utilizing AP and lateral fluoroscopy to cortical screws were placed at L3 bilaterally 60/50 screws are placed after being drilled Probed and anterior points chosen of the 5 and 7:00 position respectively. Then attention second the decompression the L3 spinous process was removed central decompression was begun complete medial facetectomies were performed and radical foraminotomies of the L3 and L4 nerve roots. There was marked stenosis of the L4 nerve roots bilaterally with severe diastases of the L3-4 facet on the right with marked hypermobility and instability. After the decompression been completed L3 14 stick and L5-S1. There was extensive scar tissue overlying the L5-S1 disc space in addition to the onlay fusion was significantly thickened and expanded the width of the L5 lamina making it very difficult to identify normal anatomy**by laminotomy by extremity extending inferiorly and S1 and in March she really wants identified dura. Identified the S1 nerve root marching suprailiac identified the disc space and performed radical foraminotomies of both the L5 and S1 nerve root with complete medial facetectomies at L5-S1. There was a large disc herniation centrally at L5-S1 as well then after decompression been completed both levels attention was taken to the interbody work first at L5-S1 disc spaces cleanout from both sides using sequential distraction I left 12 distractor in place and selected the 11-17 expandable cage. This was packed with local autograft mixed with DBX and then endplates were prepared and this was cleaned out and large central herniation was  removed. After adequate endplate  preparation been achieved the implant was placed under fluoroscopy. This was then expanded proximal 6 and half turns opening up to approximately 14 mm. Then from the opposite side cleaned out more central disc. The endplates bilaterally cleaned out the disc space packed local autograft BMP and DBX centrally and inserted a contralateral cage. I expanded the contralateral cage in similar fashion fluoroscopy confirmed good position of the cages. Then did the same thing at L3-4 removing a large central herniation at that level. The spaces cleanout radically both sides with a 12 distractor in Place at select 10-17 expandable cages 26 mm in length for this level as well. Local autograft BMP and DBX mix was packed centrally both after both cages was were inserted attention taken the placement of the cortical screws left at L4 and S1. As the interlaminar screws cannulated the L5 pedicle is unable put screws at L5. I did remove the left-sided laminar screw as it was in the way of the L4 cortical screw both the L4 and S1 cortical screws placed under fluoroscopy again and a similar technique utilizing a pilot hole drilled with a guide set at 30 mm probed and tapped with a 50 tap and 60 by 50 screws inserted bilaterally. All screws excellent purchase. After all screws and placed fluoroscopy confirmed good position of all implants I then contoured some rods placed and top tightening down the top tightening nuts reexplored to confirm the foramina to confirm patency and overlaid Gelfoam and up the dura placed a large Hemovac drain and closed the wound in layers with interrupted 5: A running 4 subcuticular Dermabond benzo and Steri-Strips and sterile dressing were applied. At the case on the account sponge counts were correct.

## 2015-03-17 MED ORDER — BACITRACIN-NEOMYCIN-POLYMYXIN OINTMENT TUBE
TOPICAL_OINTMENT | Freq: Two times a day (BID) | CUTANEOUS | Status: DC
  Administered 2015-03-17: 22:00:00 via TOPICAL
  Filled 2015-03-17: qty 15

## 2015-03-17 MED ORDER — NEOMYCIN-POLYMYXIN-PRAMOXINE 1 % EX CREA: TOPICAL_CREAM | Freq: Two times a day (BID) | CUTANEOUS | Status: DC

## 2015-03-17 MED FILL — Heparin Sodium (Porcine) Inj 1000 Unit/ML: INTRAMUSCULAR | Qty: 30 | Status: AC

## 2015-03-17 MED FILL — Sodium Chloride IV Soln 0.9%: INTRAVENOUS | Qty: 1000 | Status: AC

## 2015-03-17 NOTE — Progress Notes (Signed)
Pt. Already wearing his home cpap. No issues.

## 2015-03-17 NOTE — Progress Notes (Signed)
Patient was on home CPAP unit when RT entered room. RT will continue to monitor.

## 2015-03-17 NOTE — Progress Notes (Signed)
Called to eval patient with skin breakdown on his chin. Pt is s/p lumbar fusion, lengthy procedure, in prone position. Pt has 3 cm area on chin which is erythematous, edematous, some clear drainage, tender to touch.  Appears to not enter dermis, and pt reports that it has improved throughout the day.  He understands that face was padded with foam, and that chin was massaged periodically during case to alleviate pressure. Expect wound to heal uneventfully with topical neosporin, yet if drainage persists I can refer him to Dr. Kelly SplinterSanger for additional therapy. He understands this and is satisfied that area will heal on its own.  No other post op complaints.   Sandford Craze Wonda Goodgame, MD [phone]

## 2015-03-17 NOTE — Evaluation (Signed)
Physical Therapy Evaluation Patient Details Name: Christian Mcintyre MRN: [id number] DOB: 01/24/1956 Today's Date: 03/17/2015   History of Present Illness  Patient is a 59 yo male s/p LUMBAR THREE-FOUR, LUMBAR FIVE, SACRAL ONE POSTERIOR LUMBAR FUSION  Clinical Impression  Patient seen for evaluation, mobility and education. Patient demonstrates good overall mobility at this time. Will benefit from continued skilled PT to reinforce education and safety with mobility and spinal precautions. Will continue to see as indicated and progress as tolerated. Anticipate patient will be safe for d/c home when medically appropriate.    Follow Up Recommendations No PT follow up    Equipment Recommendations  None recommended by PT    Recommendations for Other Services       Precautions / Restrictions Precautions Precautions: Back Precaution Booklet Issued: Yes (comment) Precaution Comments: verbally reviewed with patient Required Braces or Orthoses: Spinal Brace Spinal Brace: Lumbar corset Restrictions Weight Bearing Restrictions: No      Mobility  Bed Mobility Overal bed mobility: Modified Independent             General bed mobility comments: increased time, no cues required   Transfers Overall transfer level: Modified independent               General transfer comment: increased time, no physical assist required  Ambulation/Gait Ambulation/Gait assistance: Independent Ambulation Distance (Feet): 310 Feet Assistive device: None Gait Pattern/deviations: Step-through pattern;Narrow base of support Gait velocity: decreased Gait velocity interpretation: Below normal speed for age/gender General Gait Details: steady with ambulation, ambulated 40 ft with RW, 270 without device  Stairs            Wheelchair Mobility    Modified Rankin (Stroke Patients Only)       Balance Overall balance assessment: No apparent balance deficits (not formally assessed)                                            Pertinent Vitals/Pain Pain Assessment: 0-10 Pain Score: 4  Pain Location: low back Pain Descriptors / Indicators: Aching;Sore Pain Intervention(s): Monitored during session;Premedicated before session;Relaxation;Repositioned    Home Living Family/patient expects to be discharged to:: Private residence Living Arrangements: Spouse/significant other Available Help at Discharge: Family Type of Home: House Home Access: Stairs to enter Entrance Stairs-Rails: None Entrance Stairs-Number of Steps: 2 Home Layout: Multi-level Home Equipment: Environmental consultant - 2 wheels;Cane - single point      Prior Function Level of Independence: Independent               Hand Dominance   Dominant Hand: Right    Extremity/Trunk Assessment   Upper Extremity Assessment: Overall WFL for tasks assessed           Lower Extremity Assessment: Overall WFL for tasks assessed (modest paresthesias in right foot, resolving)         Communication   Communication: No difficulties  Cognition Arousal/Alertness: Awake/alert Behavior During Therapy: WFL for tasks assessed/performed Overall Cognitive Status: Within Functional Limits for tasks assessed                      General Comments      Exercises        Assessment/Plan    PT Assessment Patient needs continued PT services  PT Diagnosis Acute pain   PT Problem List Decreased activity tolerance;Decreased mobility;Pain  PT Treatment Interventions DME  instruction;Gait training;Stair training;Functional mobility training;Therapeutic activities;Therapeutic exercise;Balance training;Patient/family education   PT Goals (Current goals can be found in the Care Plan section) Acute Rehab PT Goals Patient Stated Goal: to go home PT Goal Formulation: With patient/family Time For Goal Achievement: 03/24/15 Potential to Achieve Goals: Good    Frequency Min 5X/week   Barriers to discharge         Co-evaluation               End of Session Equipment Utilized During Treatment: Back brace Activity Tolerance: No increased pain Patient left: in chair;with call bell/phone within reach;with family/visitor present Nurse Communication: Mobility status;Precautions (3)         Time: [id number] PT Time Calculation (min) (ACUTE ONLY): 27 min   Charges:   PT Evaluation $Initial PT Evaluation Tier I: 1 Procedure PT Treatments $Gait Training: 8-22 mins   PT G CodesFabio Asa:        Jackalyn Haith J 03/17/2015, 8:45 AM Charlotte Crumbevon Marte Celani, PT DPT  [phone]

## 2015-03-17 NOTE — Progress Notes (Signed)
Pt arrived to unit via stretcher from the PACU.  Pt alert and able to move all extremities. Pt oriented to unit, staff and plan of care.

## 2015-03-17 NOTE — Progress Notes (Signed)
Subjective: Patient reports ddoing well no leg pain condition back soreness and chin soreness  Objective: Vital signs in last 24 hours: Temp:  [98.1 F (36.7 C)-98.9 F (37.2 C)] 98.5 F (36.9 C) (07/14 0623) Pulse Rate:  [65-84] 74 (07/14 0623) Resp:  [11-37] 16 (07/14 0623) BP: (104-156)/(46-100) 104/46 mmHg (07/14 0623) SpO2:  [92 %-100 %] 95 % (07/14 0623) Weight:  [116.212 kg (256 lb 3.2 oz)] 116.212 kg (256 lb 3.2 oz) (07/13 2050)  Intake/Output from previous day: 07/13 0701 - 07/14 0700 In: 3600 [I.V.:3600] Out: 2230 [Urine:1650; Drains:230; Blood:350] Intake/Output this shift:    strength out of 5 wound clean dry and intact base of his shin swollen erythematous and started to blister.  Lab Results: No results for input(s): WBC, HGB, HCT, PLT in the last 72 hours. BMET No results for input(s): NA, K, CL, CO2, GLUCOSE, BUN, CREATININE, CALCIUM in the last 72 hours.  Studies/Results: Dg Lumbar Spine 2-3 Views  03/16/2015   CLINICAL DATA:  L3-4 and L5-S1 PLIF  EXAM: DG C-ARM GT 120 MIN; LUMBAR SPINE - 2-3 VIEW  FLUOROSCOPY TIME:  If the device does not provide the exposure index:  Fluoroscopy Time (in minutes and seconds):  1 minutes 3 seconds  Number of Acquired Images:  2  COMPARISON:  None.  FINDINGS: Two intraoperative fluoroscopic spot images. Bilateral pedicle screws are noted at L3, L4 and S1. There are interbody spacer devices at L3-4 and L5-S1.  IMPRESSION: Intraoperative radiographs during L3-4 and L5-S1 PLIF.   Electronically Signed   By: Elige KoHetal  Patel   On: 03/16/2015 19:03   Dg C-arm Gt 120 Min  03/16/2015   CLINICAL DATA:  L3-4 and L5-S1 PLIF  EXAM: DG C-ARM GT 120 MIN; LUMBAR SPINE - 2-3 VIEW  FLUOROSCOPY TIME:  If the device does not provide the exposure index:  Fluoroscopy Time (in minutes and seconds):  1 minutes 3 seconds  Number of Acquired Images:  2  COMPARISON:  None.  FINDINGS: Two intraoperative fluoroscopic spot images. Bilateral pedicle screws are noted  at L3, L4 and S1. There are interbody spacer devices at L3-4 and L5-S1.  IMPRESSION: Intraoperative radiographs during L3-4 and L5-S1 PLIF.   Electronically Signed   By: Elige KoHetal  Patel   On: 03/16/2015 19:03    Assessment/Plan: Mobilized day with physical occupational therapy well apply Neosporin to his chin   LOS: 1 day     Iyah Laguna P 03/17/2015, 7:57 AM

## 2015-03-18 ENCOUNTER — Encounter (HOSPITAL_COMMUNITY): Payer: Self-pay

## 2015-03-18 MED ORDER — OXYCODONE-ACETAMINOPHEN 5-325 MG PO TABS: 1.0000 | ORAL_TABLET | ORAL | Status: DC | PRN

## 2015-03-18 NOTE — Discharge Instructions (Signed)
No lifting no bending no twisting no driving a riding a car unless he is coming back and forth to see me. Keep incision clean dry and intact. Cover the Steri-Strips with saran wrap for showers only. May remove the outer dressing in 2-3 days.

## 2015-03-18 NOTE — Discharge Summary (Signed)
  Physician Discharge Summary  Patient ID: Pauline Ausaul Gladd MRN: [id number] DOB/AGE: September 05, 1955 59 y.o.  Admit date: 03/16/2015 Discharge date: 03/18/2015  Admission Diagnoses: Lumbar stenosis herniated nuclear stenosis L3-4 L5-S1  Discharge Diagnoses: Same Active Problems:   Spinal stenosis of lumbar region   Discharged Condition: good  Hospital Course: This is been hospital underwent the aforementioned procedure postoperative patient did very well recovered in the floor on the floor patient was angling and voiding spontaneously tolerating regular diet was stable for discharge home on postoperative day 2  Consults: Significant Diagnostic Studies: Treatments: Decompression fusion L3 for L5-S1 Discharge Exam: Blood pressure 137/68, pulse 74, temperature 98.7 F (37.1 C), temperature source Oral, resp. rate 18, height 6\' 2"  (1.88 m), weight 116.212 kg (256 lb 3.2 oz), SpO2 99 %. Strength out of 5 wound clean dry and intact  Disposition: Home     Medication List    TAKE these medications        oxyCODONE-acetaminophen 5-325 MG per tablet  Commonly known as:  PERCOCET/ROXICET  Take 1-2 tablets by mouth every 4 (four) hours as needed for moderate pain.     valsartan-hydrochlorothiazide 320-12.5 MG per tablet  Commonly known as:  DIOVAN-HCT  Take 1 tablet by mouth daily.         Signed: Dalana Pfahler P 03/18/2015, 9:01 AM

## 2015-03-18 NOTE — Progress Notes (Signed)
Physical Therapy Treatment Patient Details Name: Christian Mcintyre MRN: [id number] DOB: 1956-05-05 Today's Date: 04-11-15    History of Present Illness Patient is a 59 yo male s/p LUMBAR THREE-FOUR, LUMBAR FIVE, SACRAL ONE POSTERIOR LUMBAR FUSION    PT Comments    Patient ambulating independently, able to negotiate full flight of stairs, had good recall of precautions and education. Feel patient is safe for d/c home, No further acute PT needs, will sign off.  Follow Up Recommendations  No PT follow up     Equipment Recommendations  None recommended by PT    Recommendations for Other Services       Precautions / Restrictions Precautions Precautions: Back Precaution Booklet Issued: Yes (comment) Precaution Comments: verbally reviewed with patient Required Braces or Orthoses: Spinal Brace Spinal Brace: Lumbar corset Restrictions Weight Bearing Restrictions: No    Mobility  Bed Mobility Overal bed mobility: Modified Independent             General bed mobility comments: increased time, no cues required   Transfers Overall transfer level: Modified independent               General transfer comment: increased time, no physical assist required  Ambulation/Gait Ambulation/Gait assistance: Independent Ambulation Distance (Feet): 400 Feet Assistive device: None Gait Pattern/deviations: WFL(Within Functional Limits)     General Gait Details: no instability noted   Stairs Stairs: Yes Stairs assistance: Modified independent (Device/Increase time) Stair Management: Alternating pattern;One rail Right;Forwards Number of Stairs: 12 General stair comments: no assist or cues needed  Wheelchair Mobility    Modified Rankin (Stroke Patients Only)       Balance                                    Cognition Arousal/Alertness: Awake/alert Behavior During Therapy: WFL for tasks assessed/performed Overall Cognitive Status: Within Functional Limits  for tasks assessed                      Exercises      General Comments        Pertinent Vitals/Pain Pain Assessment: 0-10 Pain Score: 5  Pain Location: back Pain Descriptors / Indicators: Sore Pain Intervention(s): Monitored during session    Home Living                      Prior Function            PT Goals (current goals can now be found in the care plan section) Acute Rehab PT Goals Patient Stated Goal: to go home PT Goal Formulation: With patient/family Time For Goal Achievement: 03/24/15 Potential to Achieve Goals: Good Progress towards PT goals: Goals met/education completed, patient discharged from PT    Frequency  Min 5X/week    PT Plan Current plan remains appropriate    Co-evaluation             End of Session Equipment Utilized During Treatment: Back brace Activity Tolerance: No increased pain Patient left: in chair;with call bell/phone within reach;with family/visitor present     Time: 0750-0806 PT Time Calculation (min) (ACUTE ONLY): 16 min  Charges:  $Self Care/Home Management: 8-22                    G CodesDuncan Dull 2015-04-11, 11:33 AM Alben Deeds, PT DPT  [phone]

## 2015-03-18 NOTE — Progress Notes (Signed)
Patient being d/c home. D/c instructions given and patient verbalized understanding. 

## 2015-03-18 NOTE — Progress Notes (Signed)
Patient ID: Christian Mcintyre, male   DOB: 16-Oct-1955, 59 y.o.   MRN: [id number] Doing well than the leg pain back pain well-controlled  Strength out of 5  Discharge home

## 2015-03-21 ENCOUNTER — Encounter (HOSPITAL_COMMUNITY): Payer: Self-pay | Admitting: Neurosurgery

## 2015-04-28 ENCOUNTER — Encounter (HOSPITAL_BASED_OUTPATIENT_CLINIC_OR_DEPARTMENT_OTHER): Payer: Self-pay | Admitting: *Deleted

## 2015-04-28 ENCOUNTER — Observation Stay (HOSPITAL_BASED_OUTPATIENT_CLINIC_OR_DEPARTMENT_OTHER)
Admission: EM | Admit: 2015-04-28 | Discharge: 2015-04-29 | Disposition: A | Discharge status: Home / Self Care | Payer: BLUE CROSS/BLUE SHIELD | Attending: Internal Medicine | Admitting: Internal Medicine

## 2015-04-28 ENCOUNTER — Emergency Department (HOSPITAL_BASED_OUTPATIENT_CLINIC_OR_DEPARTMENT_OTHER): Payer: BLUE CROSS/BLUE SHIELD

## 2015-04-28 DIAGNOSIS — Z79891 Long term (current) use of opiate analgesic: Secondary | ICD-10-CM | POA: Diagnosis not present

## 2015-04-28 DIAGNOSIS — R4781 Slurred speech: Secondary | ICD-10-CM | POA: Diagnosis not present

## 2015-04-28 DIAGNOSIS — G451 Carotid artery syndrome (hemispheric): Secondary | ICD-10-CM | POA: Diagnosis not present

## 2015-04-28 DIAGNOSIS — R471 Dysarthria and anarthria: Secondary | ICD-10-CM | POA: Insufficient documentation

## 2015-04-28 DIAGNOSIS — Z79899 Other long term (current) drug therapy: Secondary | ICD-10-CM | POA: Diagnosis not present

## 2015-04-28 DIAGNOSIS — R9431 Abnormal electrocardiogram [ECG] [EKG]: Secondary | ICD-10-CM | POA: Insufficient documentation

## 2015-04-28 DIAGNOSIS — Z981 Arthrodesis status: Secondary | ICD-10-CM | POA: Insufficient documentation

## 2015-04-28 DIAGNOSIS — I77810 Thoracic aortic ectasia: Secondary | ICD-10-CM | POA: Diagnosis not present

## 2015-04-28 DIAGNOSIS — E876 Hypokalemia: Secondary | ICD-10-CM | POA: Insufficient documentation

## 2015-04-28 DIAGNOSIS — Z87891 Personal history of nicotine dependence: Secondary | ICD-10-CM | POA: Diagnosis not present

## 2015-04-28 DIAGNOSIS — G4733 Obstructive sleep apnea (adult) (pediatric): Secondary | ICD-10-CM | POA: Diagnosis not present

## 2015-04-28 DIAGNOSIS — I10 Essential (primary) hypertension: Secondary | ICD-10-CM | POA: Diagnosis not present

## 2015-04-28 DIAGNOSIS — I447 Left bundle-branch block, unspecified: Secondary | ICD-10-CM | POA: Insufficient documentation

## 2015-04-28 DIAGNOSIS — M549 Dorsalgia, unspecified: Secondary | ICD-10-CM | POA: Diagnosis not present

## 2015-04-28 DIAGNOSIS — I639 Cerebral infarction, unspecified: Secondary | ICD-10-CM

## 2015-04-28 DIAGNOSIS — G459 Transient cerebral ischemic attack, unspecified: Principal | ICD-10-CM | POA: Insufficient documentation

## 2015-04-28 DIAGNOSIS — I440 Atrioventricular block, first degree: Secondary | ICD-10-CM | POA: Diagnosis not present

## 2015-04-28 DIAGNOSIS — G4730 Sleep apnea, unspecified: Secondary | ICD-10-CM | POA: Diagnosis present

## 2015-04-28 DIAGNOSIS — Z8673 Personal history of transient ischemic attack (TIA), and cerebral infarction without residual deficits: Secondary | ICD-10-CM | POA: Diagnosis not present

## 2015-04-28 DIAGNOSIS — G8929 Other chronic pain: Secondary | ICD-10-CM | POA: Diagnosis present

## 2015-04-28 HISTORY — DX: Cerebral infarction, unspecified: I63.9

## 2015-04-28 LAB — PROTIME-INR
INR: 1.03 (ref 0.00–1.49)
Prothrombin Time: 13.7 seconds (ref 11.6–15.2)

## 2015-04-28 LAB — COMPREHENSIVE METABOLIC PANEL
ALBUMIN: 4.4 g/dL (ref 3.5–5.0)
ALT: 27 U/L (ref 17–63)
AST: 35 U/L (ref 15–41)
Alkaline Phosphatase: 59 U/L (ref 38–126)
Anion gap: 11 (ref 5–15)
BUN: 11 mg/dL (ref 6–20)
CHLORIDE: 100 mmol/L — AB (ref 101–111)
CO2: 28 mmol/L (ref 22–32)
CREATININE: 0.92 mg/dL (ref 0.61–1.24)
Calcium: 9.9 mg/dL (ref 8.9–10.3)
GFR calc non Af Amer: >60 mL/min (ref >60)
Glucose, Bld: 92 mg/dL (ref 65–99)
Potassium: 3.6 mmol/L (ref 3.5–5.1)
SODIUM: 139 mmol/L (ref 135–145)
Total Bilirubin: 0.6 mg/dL (ref 0.3–1.2)
Total Protein: 7.4 g/dL (ref 6.5–8.1)

## 2015-04-28 LAB — URINALYSIS, ROUTINE W REFLEX MICROSCOPIC
BILIRUBIN URINE: NEGATIVE
Glucose, UA: NEGATIVE mg/dL
Hgb urine dipstick: NEGATIVE
Ketones, ur: NEGATIVE mg/dL
Leukocytes, UA: NEGATIVE
Nitrite: NEGATIVE
PH: 7 (ref 5.0–8.0)
Protein, ur: NEGATIVE mg/dL
Specific Gravity, Urine: 1.012 (ref 1.005–1.030)
Urobilinogen, UA: 1 mg/dL (ref 0.0–1.0)

## 2015-04-28 LAB — CBC
HCT: 44.3 % (ref 39.0–52.0)
Hemoglobin: 14.8 g/dL (ref 13.0–17.0)
MCH: 33.3 pg (ref 26.0–34.0)
MCHC: 33.4 g/dL (ref 30.0–36.0)
MCV: 99.8 fL (ref 78.0–100.0)
PLATELETS: 275 10*3/uL (ref 150–400)
RBC: 4.44 MIL/uL (ref 4.22–5.81)
RDW: 12.4 % (ref 11.5–15.5)
WBC: 6.1 10*3/uL (ref 4.0–10.5)

## 2015-04-28 LAB — TROPONIN I: Troponin I: <0.03 ng/mL (ref <0.031)

## 2015-04-28 LAB — DIFFERENTIAL
BASOS PCT: 0 % (ref 0–1)
Basophils Absolute: 0 10*3/uL (ref 0.0–0.1)
Eosinophils Absolute: 0 10*3/uL (ref 0.0–0.7)
Eosinophils Relative: 1 % (ref 0–5)
Lymphocytes Relative: 32 % (ref 12–46)
Lymphs Abs: 1.9 10*3/uL (ref 0.7–4.0)
MONO ABS: 1 10*3/uL (ref 0.1–1.0)
Monocytes Relative: 16 % — ABNORMAL HIGH (ref 3–12)
NEUTROS PCT: 51 % (ref 43–77)
Neutro Abs: 3.2 10*3/uL (ref 1.7–7.7)

## 2015-04-28 LAB — RAPID URINE DRUG SCREEN, HOSP PERFORMED
Amphetamines: NOT DETECTED
BARBITURATES: NOT DETECTED
Benzodiazepines: NOT DETECTED
COCAINE: NOT DETECTED
Opiates: NOT DETECTED
TETRAHYDROCANNABINOL: NOT DETECTED

## 2015-04-28 LAB — APTT: APTT: 29 s (ref 24–37)

## 2015-04-28 LAB — ETHANOL

## 2015-04-28 MED ORDER — SODIUM CHLORIDE 0.9 % IV SOLN: 250.0000 mL | INTRAVENOUS | Status: DC | PRN

## 2015-04-28 MED ORDER — SENNOSIDES-DOCUSATE SODIUM 8.6-50 MG PO TABS: 1.0000 | ORAL_TABLET | Freq: Every evening | ORAL | Status: DC | PRN

## 2015-04-28 MED ORDER — SODIUM CHLORIDE 0.9 % IJ SOLN: 3.0000 mL | INTRAMUSCULAR | Status: DC | PRN

## 2015-04-28 MED ORDER — STROKE: EARLY STAGES OF RECOVERY BOOK
Freq: Once | Status: AC
  Administered 2015-04-29
  Filled 2015-04-28: qty 1

## 2015-04-28 MED ORDER — ENOXAPARIN SODIUM 40 MG/0.4ML ~~LOC~~ SOLN: 40.0000 mg | SUBCUTANEOUS | Status: DC

## 2015-04-28 MED ORDER — ONDANSETRON HCL 4 MG/2ML IJ SOLN: 4.0000 mg | Freq: Four times a day (QID) | INTRAMUSCULAR | Status: DC | PRN

## 2015-04-28 MED ORDER — ENOXAPARIN SODIUM 40 MG/0.4ML ~~LOC~~ SOLN
40.0000 mg | Freq: Every day | SUBCUTANEOUS | Status: DC
  Administered 2015-04-29: 40 mg via SUBCUTANEOUS
  Filled 2015-04-28: qty 0.4

## 2015-04-28 MED ORDER — ONDANSETRON HCL 4 MG PO TABS: 4.0000 mg | ORAL_TABLET | Freq: Four times a day (QID) | ORAL | Status: DC | PRN

## 2015-04-28 MED ORDER — SODIUM CHLORIDE 0.9 % IJ SOLN
3.0000 mL | Freq: Two times a day (BID) | INTRAMUSCULAR | Status: DC
  Administered 2015-04-29 (×2): 3 mL via INTRAVENOUS

## 2015-04-28 MED ORDER — ASPIRIN 81 MG PO CHEW
324.0000 mg | CHEWABLE_TABLET | Freq: Once | ORAL | Status: AC
  Administered 2015-04-28: 324 mg via ORAL
  Filled 2015-04-28: qty 4

## 2015-04-28 NOTE — ED Notes (Signed)
After waking this am he noticed his speech was garbled. In continued through the day. Drove himself here.

## 2015-04-28 NOTE — Progress Notes (Signed)
Pt admitted from [facility] with stoke symptoms,pt alert and oriented,denies any pain,admission doc paged for orders,pt settled in bed with call light at bedside,will continue to monitor. Obasogie-Asidi, Joley Utecht Efe

## 2015-04-28 NOTE — H&P (Signed)
[facility] History and Physical  Effrey Davidow WUJ:[id number] DOB: 1956/01/11 DOA: 04/28/2015  Referring physician: Wynetta Emery, PA-C PCP: Dennis Bast, MD   Chief Complaint: Difficulty speaking.  HPI: Christian Mcintyre is a 59 y.o. male  with past medical history of hypertension, sleep apnea on CPAP, history of back surgery 2 who is being admitted after presenting to [facility] with complaints of speech difficulty which started after he woke up around 0600. He states that he had slower speech and difficulty finding words and this lasted for a while and then resolved. However, symptoms continued to appear and disappear several times during the day. At this time, he states that his symptoms are not as bad as earlier, but still notes that he has to concentrate more than usual when his talking. He denies headache, visual changes, decreased sensory, motor weakness. He denies chest pain, palpitations, dizziness, diaphoresis and stated the work has been a little more stressful than usual lately.  When seen the patient was in no acute distress and denied any other complaints.  Review of Systems:  Constitutional:  No weight loss, night sweats, Fevers, chills, fatigue.  HEENT:  No headaches, Difficulty swallowing,Tooth/dental problems,Sore throat,  No sneezing, itching, ear ache, nasal congestion, post nasal drip,  Cardio-vascular:  No chest pain, Orthopnea, PND, swelling in lower extremities, anasarca, dizziness, palpitations  GI:  No heartburn, indigestion, abdominal pain, nausea, vomiting, diarrhea, change in bowel habits, loss of appetite  Resp:  No shortness of breath with exertion or at rest. No excess mucus, no productive cough, No non-productive cough, No coughing up of blood.No change in color of mucus.No wheezing.No chest wall deformity  Skin:  no rash or lesions.  GU:  no dysuria, change in color of urine, no urgency or frequency. No flank pain.  Musculoskeletal:  No joint pain or  swelling. No decreased range of motion. Occasional back pain, but currently not needing any medication.  Psych:  No change in mood or affect. No depression or anxiety. No memory loss.  Neuro: As above Past Medical History  Diagnosis Date  . Hypertension   . Sleep apnea     cpap   Past Surgical History  Procedure Laterality Date  . Lumbar l4/5 fusion in 2008 Bilateral 2008  . Back surgery      x2   Social History:  reports that he quit smoking about 16 years ago. He does not have any smokeless tobacco history on file. He reports that he drinks alcohol. He reports that he does not use illicit drugs.  No Known Allergies  History reviewed. No pertinent family history.   Prior to Admission medications   Medication Sig Start Date End Date Taking? Authorizing Provider  oxyCODONE-acetaminophen (PERCOCET/ROXICET) 5-325 MG per tablet Take 1-2 tablets by mouth every 4 (four) hours as needed for moderate pain. 03/18/15   Donalee Citrin, MD  valsartan-hydrochlorothiazide (DIOVAN-HCT) 320-12.5 MG per tablet Take 1 tablet by mouth daily.    Historical Provider, MD   Physical Exam: Filed Vitals:   04/28/15 1900 04/28/15 2039 04/28/15 2100 04/28/15 2212  BP: 145/95 145/90 143/90 155/96  Pulse: 71 82 74 73  Temp:  98.4 F (36.9 C)  98.5 F (36.9 C)  TempSrc:  Oral  Oral  Resp:  18  18  Height:     (1.88 m)  Weight:    106.9 kg (235 lb 10.8 oz)  SpO2: 97% 100% 97% 98%    Wt Readings from Last 3 Encounters:  04/28/15 106.9 kg (  235 lb 10.8 oz)  03/16/15 116.212 kg (256 lb 3.2 oz)  03/09/15 110.723 kg (244 lb 1.6 oz)    General:  Appears calm and comfortable Eyes: PERRL, normal lids, irises & conjunctiva ENT: grossly normal hearing, lips & tongue Neck: no LAD, masses or thyromegaly Cardiovascular: RRR, no m/r/g. No LE edema. Telemetry: SR, no arrhythmias  Respiratory: CTA bilaterally, no w/r/r. Normal respiratory effort. Abdomen: soft, ntnd Skin: no rash or induration seen on  limited exam Musculoskeletal: grossly normal tone BUE/BLE Psychiatric: grossly normal mood and affect, speech fluent and appropriate Neurologic: Awake alert oriented 3. No cranial nerve, sensory, motor deficit. Coordination is intact, Romberg test was negative, patellar DTRs are 2+.          Labs on Admission:  Basic Metabolic Panel:  Recent Labs Lab 04/28/15 1850  NA 139  K 3.6  CL 100*  CO2 28  GLUCOSE 92  BUN 11  CREATININE 0.92  CALCIUM 9.9   Liver Function Tests:  Recent Labs Lab 04/28/15 1850  AST 35  ALT 27  ALKPHOS 59  BILITOT 0.6  PROT 7.4  ALBUMIN 4.4   No results for input(s): LIPASE, AMYLASE in the last 168 hours. No results for input(s): AMMONIA in the last 168 hours. CBC:  Recent Labs Lab 04/28/15 1850  WBC 6.1  NEUTROABS 3.2  HGB 14.8  HCT 44.3  MCV 99.8  PLT 275   Cardiac Enzymes:  Recent Labs Lab 04/28/15 1850  TROPONINI <0.03     Radiological Exams on Admission: Ct Head Wo Contrast  04/28/2015   CLINICAL DATA:  The patient is having issues with his speech since this morning.  EXAM: CT HEAD WITHOUT CONTRAST  TECHNIQUE: Contiguous axial images were obtained from the base of the skull through the vertex without intravenous contrast.  COMPARISON:  None.  FINDINGS: There is no midline shift, hydrocephalus, or mass. No acute hemorrhage or acute transcortical infarct is identified. There is chronic diffuse atrophy. The bony calvarium is intact. The visualized sinuses are clear.  IMPRESSION: No focal acute intracranial abnormality identified.  Chronic diffuse atrophy.   Electronically Signed   By: Sherian Rein M.D.   On: 04/28/2015 19:34    EKG: Independently reviewed. Vent. rate 78 BPM PR interval 232 ms QRS duration 124 ms QT/QTc 388/442 ms P-R-T axes 61 -60 62 Sinus rhythm with 1st degree A-V block Left anterior fascicular block Abnormal ECG  Assessment/Plan Principal Problem:   TIA (transient ischemic attack) Admit to  telemetry. Neuro checks. Fasting lipids and hemoglobin A1c. Echocardiogram, carotid Doppler, MRI/MRA of the brain. Continue aspirin.  Active Problems:   Essential hypertension Continue current antihypertensive therapy and monitor blood pressure.    Sleep apnea The patient is using his on CPAP matching while in the hospital.    Chronic back pain As symptomatic at this time. Analgesics as needed.   Dr. Althea Grimmer Summer was consulted by the emergency department.  Code Status: Full code DVT Prophylaxis: Lovenox SQ. Family Communication:  Disposition Plan: Admit for TIA/CVA workup. Disposition pending results.   Time spent: Over 75 minutes.  Bobette Mo [facility] Pager [phone].

## 2015-04-28 NOTE — ED Provider Notes (Signed)
CSN: [id number]     Arrival date & time 04/28/15  1822 History   First MD Initiated Contact with Patient 04/28/15 1832     Chief Complaint  Patient presents with  . Aphasia     (Consider location/radiation/quality/duration/timing/severity/associated sxs/prior Treatment) HPI   Blood pressure 156/97, pulse 95, temperature 98.5 F (36.9 C), temperature source Oral, resp. rate 20, height  (1.88 m), weight 240 lb (108.863 kg), SpO2 100 %.  Christian Mcintyre is a 59 y.o. male with past medical history significant for hypertension, smoker he quit greater than 15 years ago has over 25 year pack history. Complaining of dysarthria onset at 6 AM when he woke. States that for about every 5/had difficulty getting the words out. States that this is significantly improved however he occasionally has difficulty finding words. She went to work today, nobody commented on stranger garbled speech, he feels like his speech was slurred. He denies headache, change in vision, cervicalgia, unilateral weakness, chest pain, palpitations, shortness of breath, abdominal pain, nausea, vomiting, change in bowel or bladder habits, difficulty walking. No aspirin taken today.  Past Medical History  Diagnosis Date  . Hypertension   . Sleep apnea     cpap   Past Surgical History  Procedure Laterality Date  . Lumbar l4/5 fusion in 2008 Bilateral 2008  . Back surgery      x2   No family history on file. Social History  Substance Use Topics  . Smoking status: Former Smoker    Quit date: 09/02/1998  . Smokeless tobacco: None  . Alcohol Use: Yes     Comment: weekly    Review of Systems  10 systems reviewed and found to be negative, except as noted in the HPI.   Allergies  Review of patient's allergies indicates no known allergies.  Home Medications   Prior to Admission medications   Medication Sig Start Date End Date Taking? Authorizing Provider  oxyCODONE-acetaminophen (PERCOCET/ROXICET) 5-325 MG per  tablet Take 1-2 tablets by mouth every 4 (four) hours as needed for moderate pain. 03/18/15   Donalee Citrin, MD  valsartan-hydrochlorothiazide (DIOVAN-HCT) 320-12.5 MG per tablet Take 1 tablet by mouth daily.    Historical Provider, MD   BP 145/95 mmHg  Pulse 71  Temp(Src) 98.5 F (36.9 C) (Oral)  Resp 20  Ht  (1.88 m)  Wt 240 lb (108.863 kg)  BMI 30.80 kg/m2  SpO2 97% Physical Exam  Constitutional: He is oriented to person, place, and time. He appears well-developed and well-nourished.  HENT:  Head: Normocephalic and atraumatic.  Mouth/Throat: Oropharynx is clear and moist.  Eyes: Conjunctivae and EOM are normal. Pupils are equal, round, and reactive to light.  No TTP of maxillary or frontal sinuses  No TTP or induration of temporal arteries bilaterally  Neck: Normal range of motion. Neck supple.  FROM to C-spine. Pt can touch chin to chest without discomfort. No TTP of midline cervical spine.   Cardiovascular: Normal rate, regular rhythm and intact distal pulses.   Pulmonary/Chest: Effort normal and breath sounds normal. No respiratory distress. He has no wheezes. He has no rales. He exhibits no tenderness.  Abdominal: Soft. Bowel sounds are normal. There is no tenderness.  Musculoskeletal: Normal range of motion. He exhibits no edema or tenderness.  Neurological: He is alert and oriented to person, place, and time. No cranial nerve deficit.  II-Visual fields grossly intact. III/IV/VI-Extraocular movements intact.  Pupils reactive bilaterally. V/VII-Smile symmetric, equal eyebrow raise,  facial sensation intact VIII-  Hearing grossly intact IX/X-Normal gag XI-bilateral shoulder shrug XII-midline tongue extension Motor: 5/5 bilaterally with normal tone and bulk Cerebellar: Normal finger-to-nose  and normal heel-to-shin test.   Romberg negative Ambulates with a coordinated gait   Nursing note and vitals reviewed.   ED Course  Procedures (including critical care time) Labs  Review Labs Reviewed  DIFFERENTIAL - Abnormal; Notable for the following:    Monocytes Relative 16 (*)    All other components within normal limits  COMPREHENSIVE METABOLIC PANEL - Abnormal; Notable for the following:    Chloride 100 (*)    All other components within normal limits  ETHANOL  PROTIME-INR  APTT  CBC  TROPONIN I  URINE RAPID DRUG SCREEN, HOSP PERFORMED  URINALYSIS, ROUTINE W REFLEX MICROSCOPIC (NOT AT [facility])    Imaging Review Ct Head Wo Contrast  04/28/2015   CLINICAL DATA:  The patient is having issues with his speech since this morning.  EXAM: CT HEAD WITHOUT CONTRAST  TECHNIQUE: Contiguous axial images were obtained from the base of the skull through the vertex without intravenous contrast.  COMPARISON:  None.  FINDINGS: There is no midline shift, hydrocephalus, or mass. No acute hemorrhage or acute transcortical infarct is identified. There is chronic diffuse atrophy. The bony calvarium is intact. The visualized sinuses are clear.  IMPRESSION: No focal acute intracranial abnormality identified.  Chronic diffuse atrophy.   Electronically Signed   By: Sherian Rein M.D.   On: 04/28/2015 19:34   I have personally reviewed and evaluated these images and lab results as part of my medical decision-making.   EKG Interpretation None     ED ECG REPORT   Date: 04/28/2015  Rate: 78  Rhythm: normal sinus rhythm  QRS Axis: normal  Intervals: PR prolonged  ST/T Wave abnormalities: normal  Conduction Disutrbances:first-degree A-V block  and left anterior fascicular block  Narrative Interpretation:   Old EKG Reviewed: unchanged  I have personally reviewed the EKG tracing and agree with the computerized printout as noted.  MDM   Final diagnoses:  Transient cerebral ischemia, unspecified transient cerebral ischemia type    Filed Vitals:   04/28/15 1829 04/28/15 1900  BP: 156/97 145/95  Pulse: 95 71  Temp: 98.5 F (36.9 C)   TempSrc: Oral   Resp: 20   Height: 6'  2" (1.88 m)   Weight: 240 lb (108.863 kg)   SpO2: 100% 97%    Medications  aspirin chewable tablet 324 mg (not administered)    Christian Mcintyre is a pleasant 59 y.o. male presenting with resolving slurred speech onset greater than 12 hours ago. Neuro exam nonfocal. Patient will need TIA workup. Head CT pending, will give aspirin after CT results. Patient will need transfer to [facility].  Head CT and labwork unremarkable. Patient given aspirin, consult from [facility] Dr. Clyde Lundborg appreciated: Accepts transfer to a neuro telemetry bed. Case d/w neurologist Dr. Hosie Poisson who will evaluate the patient once he is transferred over to [facility], PA-C 04/28/15 2019  Tilden Fossa, MD 04/28/15 2300

## 2015-04-28 NOTE — Consult Note (Signed)
Stroke Consult    Chief Complaint: speech difficulties HPI: Christian Mcintyre is an 59 y.o. male hx of HTN, tobacco use admitted with concern of dysarthria and word finding difficulties. Notes waking up at 0600 with slurred speech and difficulty getting his words out. Shortly after this resolved but then continued intermittently throughout the day. Denies any sensory or motor deficits. No visual changes. No headache. Denies any recent illnesses, no new medications. Normal routine this morning, didn't skip breakfast, normal night sleep. No prior CVA or TIA. CT head imaging reviewed and overall unremarkable.    Date last known well: 04/27/2015 Time last known well: 2200 tPA Given: no, outside IV tPA window Modified Rankin: Rankin Score=0  Past Medical History  Diagnosis Date  . Hypertension   . Sleep apnea     cpap    Past Surgical History  Procedure Laterality Date  . Lumbar l4/5 fusion in 2008 Bilateral 2008  . Back surgery      x2    No family history on file. Social History:  reports that he quit smoking about 16 years ago. He does not have any smokeless tobacco history on file. He reports that he drinks alcohol. He reports that he does not use illicit drugs.  Allergies: No Known Allergies  Medications Prior to Admission  Medication Sig Dispense Refill  . oxyCODONE-acetaminophen (PERCOCET/ROXICET) 5-325 MG per tablet Take 1-2 tablets by mouth every 4 (four) hours as needed for moderate pain. 60 tablet 0  . valsartan-hydrochlorothiazide (DIOVAN-HCT) 320-12.5 MG per tablet Take 1 tablet by mouth daily.      ROS: Out of a complete 14 system review, the patient complains of only the following symptoms, and all other reviewed systems are negative. +slurred speech  Physical Examination: Filed Vitals:   04/28/15 2212  BP: 155/96  Pulse: 73  Temp: 98.5 F (36.9 C)  Resp: 18   Physical Exam  Constitutional: He appears well-developed and well-nourished.  Psych: Affect  appropriate to situation Eyes: No scleral injection HENT: No OP obstrucion Head: Normocephalic.  Cardiovascular: Normal rate and regular rhythm.  Respiratory: Effort normal and breath sounds normal.  GI: Soft. Bowel sounds are normal. No distension. There is no tenderness.  Skin: WDI  Neurologic Examination: Mental Status: Alert, oriented, thought content appropriate.  Speech fluent without evidence of aphasia.  Able to follow 3 step commands without difficulty. Cranial Nerves: II: funduscopic exam wnl bilaterally, visual fields grossly normal, pupils equal, round, reactive to light and accommodation III,IV, VI: ptosis not present, extra-ocular motions intact bilaterally V,VII: smile symmetric, facial light touch sensation normal bilaterally VIII: hearing normal bilaterally IX,X: gag reflex present XI: trapezius strength/neck flexion strength normal bilaterally XII: tongue strength normal  Motor: Right : Upper extremity    Left:     Upper extremity 5/5 deltoid       5/5 deltoid 5/5 biceps      5/5 biceps  5/5 triceps      5/5 triceps 5/5wrist flexion     5/5 wrist flexion 5/5 wrist extension     5/5 wrist extension 5/5 hand grip      5/5 hand grip  Lower extremity     Lower extremity 5/5 hip flexor      5/5 hip flexor 5/5 hip adductors     5/5 hip adductors 5/5 hip abductors     5/5 hip abductors 5/5 quadricep      5/5 quadriceps  5/5 hamstrings     5/5 hamstrings 5/5 plantar flexion  5/5 plantar flexion 5/5 plantar extension     5/5 plantar extension Tone and bulk:normal tone throughout; no atrophy noted Sensory: Pinprick and light touch intact throughout, bilaterally Deep Tendon Reflexes: 2+ and symmetric throughout Plantars: Right: downgoing   Left: downgoing Cerebellar: normal finger-to-nose, normal rapid alternating movements and normal heel-to-shin test Gait: normal gait and station  Laboratory Studies:   Basic Metabolic Panel:  Recent Labs Lab  04/28/15 1850  NA 139  K 3.6  CL 100*  CO2 28  GLUCOSE 92  BUN 11  CREATININE 0.92  CALCIUM 9.9    Liver Function Tests:  Recent Labs Lab 04/28/15 1850  AST 35  ALT 27  ALKPHOS 59  BILITOT 0.6  PROT 7.4  ALBUMIN 4.4   No results for input(s): LIPASE, AMYLASE in the last 168 hours. No results for input(s): AMMONIA in the last 168 hours.  CBC:  Recent Labs Lab 04/28/15 1850  WBC 6.1  NEUTROABS 3.2  HGB 14.8  HCT 44.3  MCV 99.8  PLT 275    Cardiac Enzymes:  Recent Labs Lab 04/28/15 1850  TROPONINI <0.03    BNP: Invalid input(s): POCBNP  CBG: No results for input(s): GLUCAP in the last 168 hours.  Microbiology: Results for orders placed or performed during the hospital encounter of 03/09/15  Surgical pcr screen     Status: None   Collection Time: 03/09/15  8:43 AM  Result Value Ref Range Status   MRSA, PCR NEGATIVE NEGATIVE Final   Staphylococcus aureus NEGATIVE NEGATIVE Final    Comment:        The Xpert SA Assay (FDA approved for NASAL specimens in patients over 28 years of age), is one component of a comprehensive surveillance program.  Test performance has been validated by [facility] for patients greater than or equal to 57 year old. It is not intended to diagnose infection nor to guide or monitor treatment.     Coagulation Studies:  Recent Labs  04/28/15 1850  LABPROT 13.7  INR 1.03    Urinalysis:  Recent Labs Lab 04/28/15 2030  COLORURINE YELLOW  LABSPEC 1.012  PHURINE 7.0  GLUCOSEU NEGATIVE  HGBUR NEGATIVE  BILIRUBINUR NEGATIVE  KETONESUR NEGATIVE  PROTEINUR NEGATIVE  UROBILINOGEN 1.0  NITRITE NEGATIVE  LEUKOCYTESUR NEGATIVE    Lipid Panel:  No results found for: CHOL, TRIG, HDL, CHOLHDL, VLDL, LDLCALC  HgbA1C: No results found for: HGBA1C  Urine Drug Screen:     Component Value Date/Time   LABOPIA NONE DETECTED 04/28/2015 2030   COCAINSCRNUR NONE DETECTED 04/28/2015 2030   LABBENZ NONE DETECTED  04/28/2015 2030   AMPHETMU NONE DETECTED 04/28/2015 2030   THCU NONE DETECTED 04/28/2015 2030   LABBARB NONE DETECTED 04/28/2015 2030    Alcohol Level:  Recent Labs Lab 04/28/15 1850  ETH <5    Other results:  Imaging: Ct Head Wo Contrast  04/28/2015   CLINICAL DATA:  The patient is having issues with his speech since this morning.  EXAM: CT HEAD WITHOUT CONTRAST  TECHNIQUE: Contiguous axial images were obtained from the base of the skull through the vertex without intravenous contrast.  COMPARISON:  None.  FINDINGS: There is no midline shift, hydrocephalus, or mass. No acute hemorrhage or acute transcortical infarct is identified. There is chronic diffuse atrophy. The bony calvarium is intact. The visualized sinuses are clear.  IMPRESSION: No focal acute intracranial abnormality identified.  Chronic diffuse atrophy.   Electronically Signed   By: Sherian Rein M.D.   On: 04/28/2015  19:34    Assessment: 59 y.o. male hx of HTN admitted with transient recurrent episodes of dysarthria and word finding difficulties. Currently asymptomatic. Admitted for TIA workup   Plan: 1. HgbA1c, fasting lipid panel 2. MRI, MRA  of the brain without contrast 3. PT consult, OT consult, Speech consult 4. Echocardiogram 5. Carotid dopplers 6. Prophylactic therapy-ASA 81mg  7. Risk factor modification 8. Telemetry monitoring 9. Frequent neuro checks 10. NPO until RN stroke swallow screen   Elspeth Cho, DO [facility] [phone]  If 7pm- 7am, please page neurology on call as listed in AMION. 04/28/2015, 10:46 PM

## 2015-04-28 NOTE — ED Notes (Signed)
Patient placed on cardiac monitor.

## 2015-04-29 ENCOUNTER — Observation Stay (HOSPITAL_BASED_OUTPATIENT_CLINIC_OR_DEPARTMENT_OTHER): Payer: BLUE CROSS/BLUE SHIELD

## 2015-04-29 ENCOUNTER — Encounter (HOSPITAL_COMMUNITY): Payer: Self-pay | Admitting: Neurology

## 2015-04-29 ENCOUNTER — Observation Stay (HOSPITAL_COMMUNITY): Payer: BLUE CROSS/BLUE SHIELD

## 2015-04-29 DIAGNOSIS — I358 Other nonrheumatic aortic valve disorders: Secondary | ICD-10-CM

## 2015-04-29 DIAGNOSIS — G459 Transient cerebral ischemic attack, unspecified: Principal | ICD-10-CM

## 2015-04-29 DIAGNOSIS — I6340 Cerebral infarction due to embolism of unspecified cerebral artery: Secondary | ICD-10-CM

## 2015-04-29 DIAGNOSIS — I10 Essential (primary) hypertension: Secondary | ICD-10-CM | POA: Diagnosis not present

## 2015-04-29 DIAGNOSIS — G4730 Sleep apnea, unspecified: Secondary | ICD-10-CM | POA: Diagnosis not present

## 2015-04-29 DIAGNOSIS — I6310 Cerebral infarction due to embolism of unspecified precerebral artery: Secondary | ICD-10-CM

## 2015-04-29 LAB — CBC
HCT: 40.8 % (ref 39.0–52.0)
Hemoglobin: 13.7 g/dL (ref 13.0–17.0)
MCH: 33.1 pg (ref 26.0–34.0)
MCHC: 33.6 g/dL (ref 30.0–36.0)
MCV: 98.6 fL (ref 78.0–100.0)
PLATELETS: 228 10*3/uL (ref 150–400)
RBC: 4.14 MIL/uL — ABNORMAL LOW (ref 4.22–5.81)
RDW: 12.7 % (ref 11.5–15.5)
WBC: 4.7 10*3/uL (ref 4.0–10.5)

## 2015-04-29 LAB — LIPID PANEL
CHOL/HDL RATIO: 2.3 ratio
CHOLESTEROL: 186 mg/dL (ref 0–200)
HDL: 80 mg/dL (ref >40)
LDL Cholesterol: 89 mg/dL (ref 0–99)
Triglycerides: 84 mg/dL (ref <150)
VLDL: 17 mg/dL (ref 0–40)

## 2015-04-29 LAB — COMPREHENSIVE METABOLIC PANEL
ALT: 23 U/L (ref 17–63)
AST: 28 U/L (ref 15–41)
Albumin: 3.6 g/dL (ref 3.5–5.0)
Alkaline Phosphatase: 49 U/L (ref 38–126)
Anion gap: 7 (ref 5–15)
BUN: 6 mg/dL (ref 6–20)
CHLORIDE: 101 mmol/L (ref 101–111)
CO2: 30 mmol/L (ref 22–32)
CREATININE: 1.04 mg/dL (ref 0.61–1.24)
Calcium: 9.1 mg/dL (ref 8.9–10.3)
Glucose, Bld: 97 mg/dL (ref 65–99)
POTASSIUM: 3.3 mmol/L — AB (ref 3.5–5.1)
Sodium: 138 mmol/L (ref 135–145)
Total Bilirubin: 0.9 mg/dL (ref 0.3–1.2)
Total Protein: 6.5 g/dL (ref 6.5–8.1)

## 2015-04-29 MED ORDER — ATORVASTATIN CALCIUM 40 MG PO TABS: 40.0000 mg | ORAL_TABLET | Freq: Every day | ORAL | Status: AC

## 2015-04-29 MED ORDER — ASPIRIN 325 MG PO TBEC: 325.0000 mg | DELAYED_RELEASE_TABLET | Freq: Every day | ORAL | Status: DC

## 2015-04-29 MED ORDER — ASPIRIN EC 325 MG PO TBEC
325.0000 mg | DELAYED_RELEASE_TABLET | Freq: Every day | ORAL | Status: DC
  Administered 2015-04-29: 325 mg via ORAL
  Filled 2015-04-29: qty 1

## 2015-04-29 MED ORDER — ATORVASTATIN CALCIUM 40 MG PO TABS
40.0000 mg | ORAL_TABLET | Freq: Every day | ORAL | Status: DC
  Administered 2015-04-29: 40 mg via ORAL
  Filled 2015-04-29: qty 1

## 2015-04-29 MED ORDER — POTASSIUM CHLORIDE CRYS ER 20 MEQ PO TBCR
40.0000 meq | EXTENDED_RELEASE_TABLET | Freq: Once | ORAL | Status: AC
  Administered 2015-04-29: 40 meq via ORAL
  Filled 2015-04-29: qty 2

## 2015-04-29 NOTE — Progress Notes (Signed)
STROKE TEAM PROGRESS NOTE  HPI Christian Mcintyre is a 59 y.o. male hx of HTN, tobacco use admitted with concern of dysarthria and word finding difficulties. Notes waking up at 0600 with slurred speech and difficulty getting his words out. Shortly after this resolved but then continued intermittently throughout the day. Denies any sensory or motor deficits. No visual changes. No headache. Denies any recent illnesses, no new medications. Normal routine this morning, didn't skip breakfast, normal night sleep. No prior CVA or TIA. CT head imaging reviewed and overall unremarkable.    Date last known well: 04/27/2015 Time last known well: 2200 tPA Given: no, outside IV tPA window Modified Rankin: Rankin Score=0    SUBJECTIVE (INTERVAL HISTORY) No family members present. The patient feels he is better but he is not yet back to baseline. Dr. Pearlean Brownie discussed the MRI findings of a small stroke felt to be embolic. A TEE and loop recorder will be recommended.   OBJECTIVE Temp:  [98.4 F (36.9 C)-98.6 F (37 C)] 98.6 F (37 C) (08/26 0700) Pulse Rate:  [61-95] 61 (08/26 0700) Cardiac Rhythm:  [-] Normal sinus rhythm (08/26 0830) Resp:  [16-20] 18 (08/26 0700) BP: (121-156)/(75-97) 125/80 mmHg (08/26 0700) SpO2:  [97 %-100 %] 100 % (08/26 0700) Weight:  [106.9 kg (235 lb 10.8 oz)-108.863 kg (240 lb)] 106.9 kg (235 lb 10.8 oz) (08/25 2212)     Component Value Date/Time   CHOL 186 04/29/2015 0428   TRIG 84 04/29/2015 0428   HDL 80 04/29/2015 0428   CHOLHDL 2.3 04/29/2015 0428   VLDL 17 04/29/2015 0428   LDLCALC 89 04/29/2015 0428   No results found for: HGBA1C    Component Value Date/Time   LABOPIA NONE DETECTED 04/28/2015 2030   COCAINSCRNUR NONE DETECTED 04/28/2015 2030   LABBENZ NONE DETECTED 04/28/2015 2030   AMPHETMU NONE DETECTED 04/28/2015 2030   THCU NONE DETECTED 04/28/2015 2030   LABBARB NONE DETECTED 04/28/2015 2030      IMAGING  Dg Chest 2 View 04/29/2015    There is no  active cardiopulmonary disease.  Ct Head Wo Contrast 04/28/2015    No focal acute intracranial abnormality identified.  Chronic diffuse atrophy.     Mr Christian Mcintyre Head/brain Wo Cm 04/29/2015    1. Several scattered, small acute left MCA territory infarcts in the premotor area. No mass effect or hemorrhage.  2. Left ICA siphon atherosclerosis but no hemodynamically significant stenosis or left MCA branch occlusion identified on intracranial MRA.  3. Underlying mild to moderate for age nonspecific subcortical white matter signal changes.  4. Mild mastoid effusions.     2-D echocardiogram 04/29/2015 Study Conclusions - Left ventricle: The cavity size was normal. There was moderate concentric hypertrophy. Systolic function was normal. The estimated ejection fraction was in the range of 50% to 55%. Wall motion was normal; there were no regional wall motion abnormalities. There was an increased relative contribution of atrial contraction to ventricular filling. Doppler parameters are consistent with abnormal left ventricular relaxation (grade 1 diastolic dysfunction). - Aortic valve: There is a calcified rounded density on the ventricular side of the Aortic Valve that measures 1.04 x 1.32 cm and emanates into the LVOT of unclear etiology. It may represent focal calcification as part of calcific AS but cannot rule out vegetation. Consider TEE if clinically indicated to further delineate. Moderately calcified annulus. Trileaflet. Moderate diffuse thickening and calcification. There was mild stenosis. There was mild regurgitation. Valve area (VTI): 1.78 cm^2. Valve area (Vmax): 1.43 cm^2.  Valve area (Vmean): 1.22 cm^2. - Aorta: Aortic root dimension: 48 mm (ED). 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 - Mitral valve: Calcified annulus. - Tricuspid valve: There was trivial regurgitation. - Pulmonic valve: There was trivial regurgitation.  PHYSICAL  EXAM  Pleasant middle-age Caucasian male currently not in distress. . Afebrile. Head is nontraumatic. Neck is supple without bruit.    Cardiac exam no murmur or gallop. Lungs are clear to auscultation. Distal pulses are well felt.  Neurological Exam :  Awake  Alert oriented x 3. Normal speech and language. Able to name, repeat and comprehend well. Animal naming 12..Eye movements full without nystagmus.fundi were not visualized. Vision acuity and fields appear normal. Hearing is normal. Palatal movements are normal. Face symmetric. Tongue midline. Normal strength, tone, reflexes and coordination. Normal sensation. Gait deferred.  ASSESSMENT/PLAN Mr. Christian Mcintyre is a 59 y.o. male with history of tobacco use, hypertension and obstructive sleep apnea on C Pap presenting with dysarthria and word finding difficulties. He did not receive IV t-PA due significant improvement in deficits.  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  Resultant mild speech difficulties  MRI  as above  MRA  as above  Carotid Doppler Bilateral: 1-39% ICA stenosis. Vertebral artery flow is antegrade.  2D Echo EF 50-55%. Aortic valve abnormality as noted above.  LDL 89  HgbA1c pending  Lovenox for VTE prophylaxis Diet Heart Room service appropriate?: Yes; Fluid consistency:: Thin  no antithrombotic prior to admission, now on aspirin 325 mg orally every day  Patient counseled to be compliant with his antithrombotic medications  Ongoing aggressive stroke risk factor management  Therapy recommendations:  No follow-up PT or OT  Disposition:  Pending  Hypertension  Stable  Permissive hypertension (OK if < 220/120) but gradually normalize in 5-7 days  Hyperlipidemia  Home meds: No lipid lowering medications prior to admission.  LDL 89, goal < 70  Now on Lipitor 40 mg daily.  Continue statin at discharge  Diabetes  HgbA1c pending, goal < 7.0  No history of diabetes  mellitus  Other Stroke Risk Factors  Cigarette smoker, quit smoking   ETOH use  Obesity, Body mass index is 30.25 kg/(m^2).   Obstructive sleep apnea, on CPAP at home   Other Active Problems  Hypokalemia - Potassium 3.3 - supplementation ordered.  Other Pertinent History    PLAN  Cardiology consult for abnormal echocardiogram as noted above.  Will need TEE and loop  Continue aspirin for now  Continue statin  Possible discharge later today.  Follow-up with Dr. Pearlean Brownie after discharge in 2 months.    Hospital day # 1   Delton See PA-C [facility] Pager [phone] 04/29/2015, 2:34 PM I have personally examined this patient, reviewed notes, independently viewed imaging studies, participated in medical decision making and plan of care. I have made any additions or clarifications directly to the above note. Agree with note above. He presented with transient expressive aphasia due to left frontal MCA branch infarct likely of embolic etiology with source to be determined. He remains at risk for neurological worsening, recurrent stroke, TIA and needs ongoing stroke evaluation and aggressive risk factor control. Cardiology consult to address aortic valve abnormal density He will likely need outpatient TEE and loop recorder. Start aspirin for now.  Delia Heady, MD Medical Director [facility] Pager: [phone] 04/29/2015 2:49 PM    To contact Stroke Continuity provider, please refer to [email or web address]. After hours, contact General  Neurology

## 2015-04-29 NOTE — Evaluation (Signed)
Occupational Therapy Evaluation Patient Details Name: Christian Mcintyre MRN: [id number] DOB: [birth year] Today's Date: 04/29/2015    History of Present Illness 59 yo male admitted with speech deficits. MRI (+)  L MCA PMH: recent PLIF by Dr Christian Mcintyre   Clinical Impression   Patient evaluated by Occupational Therapy with no further acute OT needs identified. All education has been completed and the patient has no further questions. See below for any follow-up Occupational Therapy or equipment needs. OT to sign off. Thank you for referral.   OT contacting PT Christian Mcintyre to notify no acute needs after balance assignment.     Follow Up Recommendations  No OT follow up    Equipment Recommendations  None recommended by OT    Recommendations for Other Services       Precautions / Restrictions Precautions Precautions: Back Required Braces or Orthoses: Spinal Brace Spinal Brace: Lumbar corset;Applied in sitting position      Mobility Bed Mobility Overal bed mobility: Independent                Transfers Overall transfer level: Independent                    Balance                                 Standardized Balance Assessment Standardized Balance Assessment : Berg Balance Test;Dynamic Gait Index Berg Balance Test Sit to Stand: Able to stand without using hands and stabilize independently Standing Unsupported: Able to stand safely 2 minutes Sitting with Back Unsupported but Feet Supported on Floor or Stool: Able to sit safely and securely 2 minutes Stand to Sit: Sits safely with minimal use of hands Transfers: Able to transfer safely, minor use of hands Turn 360 Degrees: Able to turn 360 degrees safely in 4 seconds or less Dynamic Gait Index Level Surface: Normal Change in Gait Speed: Normal Gait with Horizontal Head Turns: Normal Gait with Vertical Head Turns: Normal Gait and Pivot Turn: Normal Step Over Obstacle: Normal Step Around Obstacles:  Normal Steps: Normal Total Score: 24      ADL Overall ADL's : Independent                                             Vision     Perception     Praxis      Pertinent Vitals/Pain Pain Assessment: No/denies pain     Hand Dominance Right   Extremity/Trunk Assessment Upper Extremity Assessment Upper Extremity Assessment: Overall WFL for tasks assessed   Lower Extremity Assessment Lower Extremity Assessment: Overall WFL for tasks assessed   Cervical / Trunk Assessment Cervical / Trunk Assessment: Other exceptions (recent back surg)   Communication Communication Communication: No difficulties   Cognition                           General Comments       Exercises       Shoulder Instructions      Home Living Family/patient expects to be discharged to:: Private residence Living Arrangements: Spouse/significant other Available Help at Discharge: Family Type of Home: House Home Access: Stairs to enter Secretary/administrator of Steps: 2 Entrance Stairs-Rails: None Home Layout: Multi-level Alternate Level Stairs-Number of Steps: 12 Alternate Level  Stairs-Rails: Can reach both Bathroom Shower/Tub: Producer, television/film/video: Standard     Home Equipment: Environmental consultant - 2 wheels;Cane - single point   Additional Comments: drives and works.       Prior Functioning/Environment Level of Independence: Independent             OT Diagnosis:     OT Problem List:     OT Treatment/Interventions:      OT Goals(Current goals can be found in the care plan section) Acute Rehab OT Goals Patient Stated Goal: to go home  OT Frequency:     Barriers to D/C:            Co-evaluation              End of Session Equipment Utilized During Treatment: Gait belt Nurse Communication: Mobility status;Precautions  Activity Tolerance: Patient tolerated treatment well Patient left: Other (comment) (standing in room with RN)    Time: 9604-5409 OT Time Calculation (min): 15 min Charges:  OT General Charges $OT Visit: 1 Procedure OT Evaluation $Initial OT Evaluation Tier I: 1 Procedure G-Codes: OT G-codes **NOT FOR INPATIENT CLASS** Functional Assessment Tool Used: clinical judgement Functional Limitation: Self care Self Care Current Status (W1191): 0 percent impaired, limited or restricted Self Care Goal Status (Y7829): 0 percent impaired, limited or restricted Self Care Discharge Status (F6213): 0 percent impaired, limited or restricted  Christian Mcintyre 04/29/2015, 9:53 AM  Christian Mcintyre   OTR/L Pager: [phone] Office: [phone] .

## 2015-04-29 NOTE — Progress Notes (Signed)
PROGRESS NOTE  Christian Mcintyre WJX:[id number] DOB: 03-02-1956 DOA: 04/28/2015 PCP: Dennis Bast, MD  Assessment/Plan: CVA. Fasting lipids: LDL 89- statin  hemoglobin A1c. Echocardiogram carotid Doppler  MRI/MRA of the brain: + Continue aspirin.   Essential hypertension Continue current antihypertensive therapy and monitor blood pressure.   Sleep apnea The patient is using his on CPAP matching while in the hospital.   Chronic back pain As symptomatic at this time. Analgesics as needed.  Hypokalemia -replete  Code Status: full Family Communication: Disposition Plan:    Consultants:  neuro  Procedures:      HPI/Subjective: Up walking around room-- still feels as if he has a speech difficulty  Objective: Filed Vitals:   04/29/15 0700  BP: 125/80  Pulse: 61  Temp: 98.6 F (37 C)  Resp: 18   No intake or output data in the 24 hours ending 04/29/15 0953 Filed Weights   04/28/15 1829 04/28/15 2212  Weight: 108.863 kg (240 lb) 106.9 kg (235 lb 10.8 oz)    Exam:   General:  Awake, NAD  Cardiovascular: rrr  Respiratory: clear  Abdomen: +BS, soft  Musculoskeletal: no edema   Data Reviewed: Basic Metabolic Panel:  Recent Labs Lab 04/28/15 1850 04/29/15 0428  NA 139 138  K 3.6 3.3*  CL 100* 101  CO2 28 30  GLUCOSE 92 97  BUN 11 6  CREATININE 0.92 1.04  CALCIUM 9.9 9.1   Liver Function Tests:  Recent Labs Lab 04/28/15 1850 04/29/15 0428  AST 35 28  ALT 27 23  ALKPHOS 59 49  BILITOT 0.6 0.9  PROT 7.4 6.5  ALBUMIN 4.4 3.6   No results for input(s): LIPASE, AMYLASE in the last 168 hours. No results for input(s): AMMONIA in the last 168 hours. CBC:  Recent Labs Lab 04/28/15 1850 04/29/15 0428  WBC 6.1 4.7  NEUTROABS 3.2  --   HGB 14.8 13.7  HCT 44.3 40.8  MCV 99.8 98.6  PLT 275 228   Cardiac Enzymes:  Recent Labs Lab 04/28/15 1850  TROPONINI <0.03   BNP (last 3 results) No results for input(s): BNP in the last 8760  hours.  ProBNP (last 3 results) No results for input(s): PROBNP in the last 8760 hours.  CBG: No results for input(s): GLUCAP in the last 168 hours.  No results found for this or any previous visit (from the past 240 hour(s)).   Studies: Dg Chest 2 View  04/29/2015   CLINICAL DATA:  TIA symptoms, history of hypertension and previous tobacco use.  EXAM: CHEST  2 VIEW  COMPARISON:  None in PACs  FINDINGS: The lungs are adequately inflated and clear. The heart and pulmonary vascularity are normal. The mediastinum is normal in width. There is no pleural effusion. There is mild degenerative disc disease of the thoracic spine.  IMPRESSION: There is no active cardiopulmonary disease.   Electronically Signed   By: David  [place] M.D.   On: 04/29/2015 07:58   Ct Head Wo Contrast  04/28/2015   CLINICAL DATA:  The patient is having issues with his speech since this morning.  EXAM: CT HEAD WITHOUT CONTRAST  TECHNIQUE: Contiguous axial images were obtained from the base of the skull through the vertex without intravenous contrast.  COMPARISON:  None.  FINDINGS: There is no midline shift, hydrocephalus, or mass. No acute hemorrhage or acute transcortical infarct is identified. There is chronic diffuse atrophy. The bony calvarium is intact. The visualized sinuses are clear.  IMPRESSION: No focal acute intracranial abnormality identified.  Chronic diffuse atrophy.   Electronically Signed   By: Sherian Rein M.D.   On: 04/28/2015 19:34   Mr Brain Wo Contrast  04/29/2015   CLINICAL DATA:  59 year old male with acute onset slurred speech and dysarthria upon waking. Initial encounter.  EXAM: MRI HEAD WITHOUT CONTRAST  MRA HEAD WITHOUT CONTRAST  TECHNIQUE: Multiplanar, multiecho pulse sequences of the brain and surrounding structures were obtained without intravenous contrast. Angiographic images of the head were obtained using MRA technique without contrast.  COMPARISON:  Head CT without contrast 04/28/2015.  FINDINGS:  MRI HEAD FINDINGS  Cerebral volume is within normal limits for age. Several scattered, small (up to 15 mm) foci of restricted diffusion in the left MCA territory affecting the inferior aspect of the superior frontal gyrus/middle frontal gyrus in the pre motor area. Mostly subcortical white matter is affected. Mild associated T2 and FLAIR hyperintensity. No associated hemorrhage or mass effect.  No contralateral or posterior fossa restricted diffusion. Major intracranial vascular flow voids are within normal limits, dominant distal left vertebral artery.  Scattered small bilateral cerebral white matter mostly subcortical foci of nonspecific T2 and FLAIR hyperintensity. No cortical encephalomalacia or chronic cerebral blood products. Deep gray matter nuclei, brainstem, and cerebellum are within normal limits.  Visible internal auditory structures appear normal. Mild mastoid effusions, more so on the right. Trace retained secretions in the nasopharynx. Negative paranasal sinuses, orbits soft tissues, scalp soft tissues, and visualized cervical spine. Normal bone marrow signal.  MRA HEAD FINDINGS  Antegrade flow in the posterior circulation. The non dominant distal right vertebral artery functionally terminates in PICA, with only a diminutive contribution to the basilar. Normal left PICA origin. No basilar artery stenosis. Normal SCA and PCA origins. Posterior communicating arteries are diminutive or absent. Bilateral PCA branches are within normal limits.  Antegrade flow in both ICA siphons. There is mild to moderate siphon irregularity greater on the left, without hemodynamically significant siphon stenosis. Normal ophthalmic artery origins. Patent carotid termini. Normal MCA and ACA origins.  Anterior communicating artery and visualized bilateral ACA branches are within normal limits. Right MCA M1 segment and visualized branches are within normal limits. Left MCA M1 segment and bifurcation are patent. No left MCA  branch occlusion is identified.  IMPRESSION: 1. Several scattered, small acute left MCA territory infarcts in the premotor area. No mass effect or hemorrhage. 2. Left ICA siphon atherosclerosis but no hemodynamically significant stenosis or left MCA branch occlusion identified on intracranial MRA. 3. Underlying mild to moderate for age nonspecific subcortical white matter signal changes. 4. Mild mastoid effusions.   Electronically Signed   By: Odessa Fleming M.D.   On: 04/29/2015 07:32   [name] Head/brain Wo Cm  04/29/2015   CLINICAL DATA:  59 year old male with acute onset slurred speech and dysarthria upon waking. Initial encounter.  EXAM: MRI HEAD WITHOUT CONTRAST  MRA HEAD WITHOUT CONTRAST  TECHNIQUE: Multiplanar, multiecho pulse sequences of the brain and surrounding structures were obtained without intravenous contrast. Angiographic images of the head were obtained using MRA technique without contrast.  COMPARISON:  Head CT without contrast 04/28/2015.  FINDINGS: MRI HEAD FINDINGS  Cerebral volume is within normal limits for age. Several scattered, small (up to 15 mm) foci of restricted diffusion in the left MCA territory affecting the inferior aspect of the superior frontal gyrus/middle frontal gyrus in the pre motor area. Mostly subcortical white matter is affected. Mild associated T2 and FLAIR hyperintensity. No associated hemorrhage or mass effect.  No contralateral or  posterior fossa restricted diffusion. Major intracranial vascular flow voids are within normal limits, dominant distal left vertebral artery.  Scattered small bilateral cerebral white matter mostly subcortical foci of nonspecific T2 and FLAIR hyperintensity. No cortical encephalomalacia or chronic cerebral blood products. Deep gray matter nuclei, brainstem, and cerebellum are within normal limits.  Visible internal auditory structures appear normal. Mild mastoid effusions, more so on the right. Trace retained secretions in the nasopharynx.  Negative paranasal sinuses, orbits soft tissues, scalp soft tissues, and visualized cervical spine. Normal bone marrow signal.  MRA HEAD FINDINGS  Antegrade flow in the posterior circulation. The non dominant distal right vertebral artery functionally terminates in PICA, with only a diminutive contribution to the basilar. Normal left PICA origin. No basilar artery stenosis. Normal SCA and PCA origins. Posterior communicating arteries are diminutive or absent. Bilateral PCA branches are within normal limits.  Antegrade flow in both ICA siphons. There is mild to moderate siphon irregularity greater on the left, without hemodynamically significant siphon stenosis. Normal ophthalmic artery origins. Patent carotid termini. Normal MCA and ACA origins.  Anterior communicating artery and visualized bilateral ACA branches are within normal limits. Right MCA M1 segment and visualized branches are within normal limits. Left MCA M1 segment and bifurcation are patent. No left MCA branch occlusion is identified.  IMPRESSION: 1. Several scattered, small acute left MCA territory infarcts in the premotor area. No mass effect or hemorrhage. 2. Left ICA siphon atherosclerosis but no hemodynamically significant stenosis or left MCA branch occlusion identified on intracranial MRA. 3. Underlying mild to moderate for age nonspecific subcortical white matter signal changes. 4. Mild mastoid effusions.   Electronically Signed   By: Odessa Fleming M.D.   On: 04/29/2015 07:32    Scheduled Meds: . enoxaparin (LOVENOX) injection  40 mg Subcutaneous QHS  . potassium chloride  40 mEq Oral Once  . sodium chloride  3 mL Intravenous Q12H   Continuous Infusions:  Antibiotics Given (last 72 hours)    None      Principal Problem:   TIA (transient ischemic attack) Active Problems:   Essential hypertension   Sleep apnea   Chronic back pain    Time spent: 25 min    VANN, JESSICA  [facility] Pager [phone]. If 7PM-7AM, please  contact night-coverage at [email or web address], password [facility] 04/29/2015, 9:53 AM  LOS: 1 day

## 2015-04-29 NOTE — Evaluation (Signed)
Speech Language Pathology Evaluation Patient Details Name: Christian Mcintyre MRN: [id number] DOB: Nov 24, 1955 Today's Date: 04/29/2015 Time: 5784-6962 SLP Time Calculation (min) (ACUTE ONLY): 20 min  Problem List:  Patient Active Problem List   Diagnosis Date Noted  . TIA (transient ischemic attack) 04/28/2015  . Essential hypertension 04/28/2015  . Sleep apnea 04/28/2015  . Chronic back pain 04/28/2015  . Spinal stenosis of lumbar region 03/16/2015   Past Medical History:  Past Medical History  Diagnosis Date  . Hypertension   . Sleep apnea     cpap  . Stroke    Past Surgical History:  Past Surgical History  Procedure Laterality Date  . Lumbar l4/5 fusion in 2008 Bilateral 2008  . Back surgery      x2   HPI:  59 yo male admitted with speech deficits. MRI (+) L MCA PMH: recent PLIF by Dr Wynetta Emery   Assessment / Plan / Recommendation Clinical Impression  Pt scored 30/30 on the MoCA, with no apparent deficits with cognitive-lingusitic function or motor speech. Pt and wife say that while his symptoms have generally resolved, he will occasionally "trip" over his words. SLP offered education about compensatory strategies to facilitate speech output, with pt and wife verbalizing their understanding. No further SLP f/u indicated.    SLP Assessment  Patient does not need any further Speech Lanaguage Pathology Services    Follow Up Recommendations  None    Pertinent Vitals/Pain Pain Assessment: No/denies pain   SLP Goals  Patient/Family Stated Goal: ready to go home  SLP Evaluation Prior Functioning  Cognitive/Linguistic Baseline: Within functional limits Type of Home: House  Lives With: Spouse Available Help at Discharge: Family   Cognition  Overall Cognitive Status: Within Functional Limits for tasks assessed Orientation Level: Oriented X4    Comprehension  Auditory Comprehension Overall Auditory Comprehension: Appears within functional limits for tasks assessed Visual  Recognition/Discrimination Discrimination: Within Function Limits    Expression Expression Primary Mode of Expression: Verbal Verbal Expression Overall Verbal Expression: Appears within functional limits for tasks assessed   Oral / Motor Motor Speech Overall Motor Speech: Appears within functional limits for tasks assessed   GO Functional Assessment Tool Used: skilled clinical judgment Functional Limitations: Motor speech Motor Speech Current Status [phone]): At least 1 percent but less than 20 percent impaired, limited or restricted Motor Speech Goal Status [phone]): At least 1 percent but less than 20 percent impaired, limited or restricted Motor Speech Goal Status [phone]): At least 1 percent but less than 20 percent impaired, limited or restricted    Maxcine Ham, M.A. CCC-SLP [phone]  Maxcine Ham 04/29/2015, 3:33 PM

## 2015-04-29 NOTE — Progress Notes (Signed)
Pt being discharged home via wheelchair with family. Pt alert and oriented x4. VSS. Pt c/o no pain at this time. No signs of respiratory distress. Education complete and care plans resolved. IV removed with catheter intact and pt tolerated well. No further issues at this time. Pt to follow up with PCP. Caroll Weinheimer R, RN 

## 2015-04-29 NOTE — Discharge Summary (Signed)
Physician Discharge Summary  Christian Mcintyre YQM:[id number] DOB: June 05, 1956 DOA: 04/28/2015  PCP: Dennis Bast, MD  Admit date: 04/28/2015 Discharge date: 05/01/2015  Time spent: 35 minutes  Recommendations for Outpatient Follow-up:  Cardiology TEE outpatient  Discharge Diagnoses:  Principal Problem:   TIA (transient ischemic attack) Active Problems:   Essential hypertension   Sleep apnea   Chronic back pain   Discharge Condition: improved  Diet recommendation: cardiac  Filed Weights   04/28/15 1829 04/28/15 2212  Weight: 108.863 kg (240 lb) 106.9 kg (235 lb 10.8 oz)    History of present illness:  Christian Mcintyre is a 59 y.o. male with past medical history of hypertension, sleep apnea on CPAP, history of back surgery 2 who is being admitted after presenting to [facility] with complaints of speech difficulty which started after he woke up around 0600. He states that he had slower speech and difficulty finding words and this lasted for a while and then resolved. However, symptoms continued to appear and disappear several times during the day. At this time, he states that his symptoms are not as bad as earlier, but still notes that he has to concentrate more than usual when his talking. He denies headache, visual changes, decreased sensory, motor weakness. He denies chest pain, palpitations, dizziness, diaphoresis and stated the work has been a little more stressful than usual lately.  When seen the patient was in no acute distress and denied any other complaints.  Hospital Course:  CVA. Fasting lipids: LDL 89- statin hemoglobin B2W 5.1 Echocardiogram- see below carotid Doppler see below MRI/MRA of the brain: + Continue aspirin.   Essential hypertension Continue current antihypertensive therapy and monitor blood pressure.   Sleep apnea The patient is using his on CPAP matching while in the hospital.   Chronic back pain As symptomatic at this time. Analgesics as  needed.  Hypokalemia -replete  Procedures:  Carotids: Bilateral: 1-39% ICA stenosis. Vertebral artery flow is antegrade  Echo:Study Conclusions  - Left ventricle: The cavity size was normal. There was moderate concentric hypertrophy. Systolic function was normal. The estimated ejection fraction was in the range of 50% to 55%. Wall motion was normal; there were no regional wall motion abnormalities. There was an increased relative contribution of atrial contraction to ventricular filling. Doppler parameters are consistent with abnormal left ventricular relaxation (grade 1 diastolic dysfunction). - Aortic valve: There is a calcified rounded density on the ventricular side of the Aortic Valve that measures 1.04 x 1.32 cm and emanates into the LVOT of unclear etiology. It may represent focal calcification as part of calcific AS but cannot rule out vegetation. Consider TEE if clinically indicated to further delineate. Moderately calcified annulus. Trileaflet. Moderate diffuse thickening and calcification. There was mild stenosis. There was mild regurgitation. Valve area (VTI): 1.78 cm^2. Valve area (Vmax): 1.43 cm^2. Valve area (Vmean): 1.22 cm^2. - Aorta: Aortic root dimension: 48 mm (ED). 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 - Mitral valve: Calcified annulus. - Tricuspid valve: There was trivial regurgitation. - Pulmonic valve: There was trivial regurgitation.  Consultations:  Cards  neuro  Discharge Exam: Filed Vitals:   04/29/15 1502  BP: 143/95  Pulse: 75  Temp: 98.4 F (36.9 C)  Resp: 17     Discharge Instructions   Discharge Instructions    Ambulatory referral to Neurology    Complete by:  As directed   Dr. Pearlean Brownie requests follow up for this patient in 2 months.     Diet - low sodium  heart healthy    Complete by:  As directed      Increase activity slowly    Complete by:  As directed            Discharge Medication List as of 04/29/2015  6:04 PM    START taking these medications   Details  aspirin EC 325 MG EC tablet Take 1 tablet (325 mg total) by mouth daily., Starting 04/29/2015, Until Discontinued, No Print    atorvastatin (LIPITOR) 40 MG tablet Take 1 tablet (40 mg total) by mouth daily at 6 PM., Starting 04/29/2015, Until Discontinued, Print      CONTINUE these medications which have NOT CHANGED   Details  valsartan-hydrochlorothiazide (DIOVAN-HCT) 320-12.5 MG per tablet Take 1 tablet by mouth daily., Until Discontinued, Historical Med      STOP taking these medications     oxyCODONE-acetaminophen (PERCOCET/ROXICET) 5-325 MG per tablet        No Known Allergies Follow-up Information    Follow up with SETHI,PRAMOD, MD. Schedule an appointment as soon as possible for a visit in 2 months.   Specialties:  Neurology, Radiology   Contact information:   [address] [place] [address] [phone]       Please follow up.   Why:  Cardiology will contact you next week for outpatient Transesophageal echocardiogram      Follow up with CABEZA,YURI, MD In 1 week.   Specialty:  Internal Medicine   Contact information:   [address] [place] [address] [phone]        The results of significant diagnostics from this hospitalization (including imaging, microbiology, ancillary and laboratory) are listed below for reference.    Significant Diagnostic Studies: Dg Chest 2 View  04/29/2015   CLINICAL DATA:  TIA symptoms, history of hypertension and previous tobacco use.  EXAM: CHEST  2 VIEW  COMPARISON:  None in PACs  FINDINGS: The lungs are adequately inflated and clear. The heart and pulmonary vascularity are normal. The mediastinum is normal in width. There is no pleural effusion. There is mild degenerative disc disease of the thoracic spine.  IMPRESSION: There is no active cardiopulmonary disease.   Electronically Signed   By:  David  [place] M.D.   On: 04/29/2015 07:58   Ct Head Wo Contrast  04/28/2015   CLINICAL DATA:  The patient is having issues with his speech since this morning.  EXAM: CT HEAD WITHOUT CONTRAST  TECHNIQUE: Contiguous axial images were obtained from the base of the skull through the vertex without intravenous contrast.  COMPARISON:  None.  FINDINGS: There is no midline shift, hydrocephalus, or mass. No acute hemorrhage or acute transcortical infarct is identified. There is chronic diffuse atrophy. The bony calvarium is intact. The visualized sinuses are clear.  IMPRESSION: No focal acute intracranial abnormality identified.  Chronic diffuse atrophy.   Electronically Signed   By: Sherian Rein M.D.   On: 04/28/2015 19:34   Mr Brain Wo Contrast  04/29/2015   CLINICAL DATA:  59 year old male with acute onset slurred speech and dysarthria upon waking. Initial encounter.  EXAM: MRI HEAD WITHOUT CONTRAST  MRA HEAD WITHOUT CONTRAST  TECHNIQUE: Multiplanar, multiecho pulse sequences of the brain and surrounding structures were obtained without intravenous contrast. Angiographic images of the head were obtained using MRA technique without contrast.  COMPARISON:  Head CT without contrast 04/28/2015.  FINDINGS: MRI HEAD FINDINGS  Cerebral volume is within normal limits for age. Several scattered, small (up to  15 mm) foci of restricted diffusion in the left MCA territory affecting the inferior aspect of the superior frontal gyrus/middle frontal gyrus in the pre motor area. Mostly subcortical white matter is affected. Mild associated T2 and FLAIR hyperintensity. No associated hemorrhage or mass effect.  No contralateral or posterior fossa restricted diffusion. Major intracranial vascular flow voids are within normal limits, dominant distal left vertebral artery.  Scattered small bilateral cerebral white matter mostly subcortical foci of nonspecific T2 and FLAIR hyperintensity. No cortical encephalomalacia or chronic cerebral  blood products. Deep gray matter nuclei, brainstem, and cerebellum are within normal limits.  Visible internal auditory structures appear normal. Mild mastoid effusions, more so on the right. Trace retained secretions in the nasopharynx. Negative paranasal sinuses, orbits soft tissues, scalp soft tissues, and visualized cervical spine. Normal bone marrow signal.  MRA HEAD FINDINGS  Antegrade flow in the posterior circulation. The non dominant distal right vertebral artery functionally terminates in PICA, with only a diminutive contribution to the basilar. Normal left PICA origin. No basilar artery stenosis. Normal SCA and PCA origins. Posterior communicating arteries are diminutive or absent. Bilateral PCA branches are within normal limits.  Antegrade flow in both ICA siphons. There is mild to moderate siphon irregularity greater on the left, without hemodynamically significant siphon stenosis. Normal ophthalmic artery origins. Patent carotid termini. Normal MCA and ACA origins.  Anterior communicating artery and visualized bilateral ACA branches are within normal limits. Right MCA M1 segment and visualized branches are within normal limits. Left MCA M1 segment and bifurcation are patent. No left MCA branch occlusion is identified.  IMPRESSION: 1. Several scattered, small acute left MCA territory infarcts in the premotor area. No mass effect or hemorrhage. 2. Left ICA siphon atherosclerosis but no hemodynamically significant stenosis or left MCA branch occlusion identified on intracranial MRA. 3. Underlying mild to moderate for age nonspecific subcortical white matter signal changes. 4. Mild mastoid effusions.   Electronically Signed   By: Odessa Fleming M.D.   On: 04/29/2015 07:32   [name] Head/brain Wo Cm  04/29/2015   CLINICAL DATA:  59 year old male with acute onset slurred speech and dysarthria upon waking. Initial encounter.  EXAM: MRI HEAD WITHOUT CONTRAST  MRA HEAD WITHOUT CONTRAST  TECHNIQUE: Multiplanar,  multiecho pulse sequences of the brain and surrounding structures were obtained without intravenous contrast. Angiographic images of the head were obtained using MRA technique without contrast.  COMPARISON:  Head CT without contrast 04/28/2015.  FINDINGS: MRI HEAD FINDINGS  Cerebral volume is within normal limits for age. Several scattered, small (up to 15 mm) foci of restricted diffusion in the left MCA territory affecting the inferior aspect of the superior frontal gyrus/middle frontal gyrus in the pre motor area. Mostly subcortical white matter is affected. Mild associated T2 and FLAIR hyperintensity. No associated hemorrhage or mass effect.  No contralateral or posterior fossa restricted diffusion. Major intracranial vascular flow voids are within normal limits, dominant distal left vertebral artery.  Scattered small bilateral cerebral white matter mostly subcortical foci of nonspecific T2 and FLAIR hyperintensity. No cortical encephalomalacia or chronic cerebral blood products. Deep gray matter nuclei, brainstem, and cerebellum are within normal limits.  Visible internal auditory structures appear normal. Mild mastoid effusions, more so on the right. Trace retained secretions in the nasopharynx. Negative paranasal sinuses, orbits soft tissues, scalp soft tissues, and visualized cervical spine. Normal bone marrow signal.  MRA HEAD FINDINGS  Antegrade flow in the posterior circulation. The non dominant distal right vertebral artery functionally terminates in  PICA, with only a diminutive contribution to the basilar. Normal left PICA origin. No basilar artery stenosis. Normal SCA and PCA origins. Posterior communicating arteries are diminutive or absent. Bilateral PCA branches are within normal limits.  Antegrade flow in both ICA siphons. There is mild to moderate siphon irregularity greater on the left, without hemodynamically significant siphon stenosis. Normal ophthalmic artery origins. Patent carotid termini.  Normal MCA and ACA origins.  Anterior communicating artery and visualized bilateral ACA branches are within normal limits. Right MCA M1 segment and visualized branches are within normal limits. Left MCA M1 segment and bifurcation are patent. No left MCA branch occlusion is identified.  IMPRESSION: 1. Several scattered, small acute left MCA territory infarcts in the premotor area. No mass effect or hemorrhage. 2. Left ICA siphon atherosclerosis but no hemodynamically significant stenosis or left MCA branch occlusion identified on intracranial MRA. 3. Underlying mild to moderate for age nonspecific subcortical white matter signal changes. 4. Mild mastoid effusions.   Electronically Signed   By: Odessa Fleming M.D.   On: 04/29/2015 07:32    Microbiology: No results found for this or any previous visit (from the past 240 hour(s)).   Labs: Basic Metabolic Panel:  Recent Labs Lab 04/28/15 1850 04/29/15 0428  NA 139 138  K 3.6 3.3*  CL 100* 101  CO2 28 30  GLUCOSE 92 97  BUN 11 6  CREATININE 0.92 1.04  CALCIUM 9.9 9.1   Liver Function Tests:  Recent Labs Lab 04/28/15 1850 04/29/15 0428  AST 35 28  ALT 27 23  ALKPHOS 59 49  BILITOT 0.6 0.9  PROT 7.4 6.5  ALBUMIN 4.4 3.6   No results for input(s): LIPASE, AMYLASE in the last 168 hours. No results for input(s): AMMONIA in the last 168 hours. CBC:  Recent Labs Lab 04/28/15 1850 04/29/15 0428  WBC 6.1 4.7  NEUTROABS 3.2  --   HGB 14.8 13.7  HCT 44.3 40.8  MCV 99.8 98.6  PLT 275 228   Cardiac Enzymes:  Recent Labs Lab 04/28/15 1850  TROPONINI <0.03   BNP: BNP (last 3 results) No results for input(s): BNP in the last 8760 hours.  ProBNP (last 3 results) No results for input(s): PROBNP in the last 8760 hours.  CBG: No results for input(s): GLUCAP in the last 168 hours.     SignedMarlin Canary  [facility] 05/01/2015, 1:42 PM

## 2015-04-29 NOTE — Progress Notes (Signed)
PT Cancellation Note  Patient Details Name: Anurag Scarfo MRN: [id number] DOB: Mar 20, 1956   Cancelled Treatment:    Reason Eval/Treat Not Completed: OT screened, no needs identified, will sign off.  OT screened for PT needs no needs identified. See OT notes for details. Thanks,   Rollene Rotunda. Annice Jolly, PT, DPT [phone]   04/29/2015, 10:50 AM

## 2015-04-29 NOTE — Progress Notes (Signed)
  Echocardiogram 2D Echocardiogram has been performed.  Christian Mcintyre 04/29/2015, 11:14 AM

## 2015-04-29 NOTE — Progress Notes (Signed)
VASCULAR LAB PRELIMINARY  PRELIMINARY  PRELIMINARY  PRELIMINARY  Carotid duplex completed.    Preliminary report:  Bilateral:  1-39% ICA stenosis.  Vertebral artery flow is antegrade.     Minna Dumire, RVS 04/29/2015, 11:05 AM

## 2015-04-29 NOTE — Consult Note (Signed)
CARDIOLOGY CONSULT NOTE   Patient ID: Christian Mcintyre MRN: [id number], DOB/AGE: Apr 28, 1956   Admit date: 04/28/2015 Date of Consult: 04/29/2015   Primary Physician: Dennis Bast, MD Primary Cardiologist: new  Pt. Profile  59 year old Caucasian male with past medical history of hypertension, herniated disc, and obstructive sleep apnea on CPAP presented with stroke and found to have a mass on aortic valve.  Problem List  Past Medical History  Diagnosis Date  . Hypertension   . Sleep apnea     cpap  . Stroke     Past Surgical History  Procedure Laterality Date  . Lumbar l4/5 fusion in 2008 Bilateral 2008  . Back surgery      x2  . Hernia repair       Allergies  No Known Allergies  HPI   The patient is a pleasant 59 year old Caucasian male with past medical history of hypertension, herniated disc, and OSA on CPAP. He denies any past cardiac history and denies any family history of CAD. He has been compliant with his medication. In the last 2 months, he has been having significant back pain, limiting his ability to do strenuous activity. However prior to that, he was working in his yard without any discomfort. He denies any illicit drug use in the recent years. He did smoke for 20 years, however quit more than 10 years ago. He drinks one glass of wine per night. He denies any recent fever, chill, cough, lower extremity edema, chest discomfort or shortness breath. He was essentially in his usual state of health until he woke up in the morning of 04/28/2015 with slurring of speech. The symptom improved, however returned later prompting the patient to seek medical attention at [facility].  He was admitted to the internal medicine service. Initial CT of the brain was negative for acute process. However follow-up MRA of the brain showed several scattered small acute left MCA territory infarct in the premotor area. Carotid ultrasound did not reveals any significant stenosis.  Echocardiogram was obtained on 04/29/2015 which showed EF 50-55%, grade 1 diastolic dysfunction, calcified rounded density in the ventricular side of aortic valve measures 1.04 x 1.32 cm and emanates into the LVOT of unclear etiology, and may represent focal calcifications part of calcific AS but cannot rule out vegetation, TEE was recommended. There was mild AS and aVR, aortic root was moderately dilated at 48 mm. Cardiology has been consulted. Per neurology, patient likely need a TEE and possibly loop recorder placement. EKG showed chronic left bundle branch block.  Inpatient Medications  . atorvastatin  40 mg Oral q1800  . enoxaparin (LOVENOX) injection  40 mg Subcutaneous QHS  . sodium chloride  3 mL Intravenous Q12H    Family History Family History  Problem Relation Age of Onset  . Hypertension Brother      Social History Social History   Social History  . Marital Status: Married    Spouse Name: N/A  . Number of Children: N/A  . Years of Education: N/A   Occupational History  . Not on file.   Social History Main Topics  . Smoking status: Former Smoker    Quit date: 09/02/1998  . Smokeless tobacco: Not on file  . Alcohol Use: 0.0 oz/week    0 Standard drinks or equivalent per week     Comment: daily wine  . Drug Use: No  . Sexual Activity: Not on file   Other Topics Concern  . Not on file   Social History  Narrative     Review of Systems  General:  No chills, fever, night sweats or weight changes.  Cardiovascular:  No chest pain, dyspnea on exertion, edema, orthopnea, palpitations, paroxysmal nocturnal dyspnea. Dermatological: No rash, lesions/masses Respiratory: No cough, dyspnea Urologic: No hematuria, dysuria Abdominal:   No nausea, vomiting, diarrhea, bright red blood per rectum, melena, or hematemesis Neurologic:  No visual changes, wkns, changes in mental status. +slurring of speech, denies any ipsilateral weakness All other systems reviewed and are  otherwise negative except as noted above.  Physical Exam  Blood pressure 143/95, pulse 75, temperature 98.4 F (36.9 C), temperature source Oral, resp. rate 17, height 6\' 2"  (1.88 m), weight 235 lb 10.8 oz (106.9 kg), SpO2 98 %.  General: Pleasant, NAD Psych: Normal affect. Neuro: Alert and oriented X 3. Moves all extremities spontaneously. HEENT: Normal  Neck: Supple without bruits or JVD. Lungs:  Resp regular and unlabored, CTA. Heart: RRR no s3, s4, or murmurs. Abdomen: Soft, non-tender, non-distended, BS + x 4.  Extremities: No clubbing, cyanosis or edema. DP/PT/Radials 2+ and equal bilaterally.  Labs   Recent Labs  04/28/15 1850  TROPONINI <0.03   Lab Results  Component Value Date   WBC 4.7 04/29/2015   HGB 13.7 04/29/2015   HCT 40.8 04/29/2015   MCV 98.6 04/29/2015   PLT 228 04/29/2015     Recent Labs Lab 04/29/15 0428  NA 138  K 3.3*  CL 101  CO2 30  BUN 6  CREATININE 1.04  CALCIUM 9.1  PROT 6.5  BILITOT 0.9  ALKPHOS 49  ALT 23  AST 28  GLUCOSE 97   Lab Results  Component Value Date   CHOL 186 04/29/2015   HDL 80 04/29/2015   LDLCALC 89 04/29/2015   TRIG 84 04/29/2015   No results found for: DDIMER  Radiology/Studies  Dg Chest 2 View  04/29/2015   CLINICAL DATA:  TIA symptoms, history of hypertension and previous tobacco use.  EXAM: CHEST  2 VIEW  COMPARISON:  None in PACs  FINDINGS: The lungs are adequately inflated and clear. The heart and pulmonary vascularity are normal. The mediastinum is normal in width. There is no pleural effusion. There is mild degenerative disc disease of the thoracic spine.  IMPRESSION: There is no active cardiopulmonary disease.   Electronically Signed   By: David  [place] M.D.   On: 04/29/2015 07:58   Ct Head Wo Contrast  04/28/2015   CLINICAL DATA:  The patient is having issues with his speech since this morning.  EXAM: CT HEAD WITHOUT CONTRAST  TECHNIQUE: Contiguous axial images were obtained from the base of the  skull through the vertex without intravenous contrast.  COMPARISON:  None.  FINDINGS: There is no midline shift, hydrocephalus, or mass. No acute hemorrhage or acute transcortical infarct is identified. There is chronic diffuse atrophy. The bony calvarium is intact. The visualized sinuses are clear.  IMPRESSION: No focal acute intracranial abnormality identified.  Chronic diffuse atrophy.   Electronically Signed   By: Sherian Rein M.D.   On: 04/28/2015 19:34   Mr Brain Wo Contrast  04/29/2015   CLINICAL DATA:  59 year old male with acute onset slurred speech and dysarthria upon waking. Initial encounter.  EXAM: MRI HEAD WITHOUT CONTRAST  MRA HEAD WITHOUT CONTRAST  TECHNIQUE: Multiplanar, multiecho pulse sequences of the brain and surrounding structures were obtained without intravenous contrast. Angiographic images of the head were obtained using MRA technique without contrast.  COMPARISON:  Head CT without contrast 04/28/2015.  FINDINGS: MRI HEAD FINDINGS  Cerebral volume is within normal limits for age. Several scattered, small (up to 15 mm) foci of restricted diffusion in the left MCA territory affecting the inferior aspect of the superior frontal gyrus/middle frontal gyrus in the pre motor area. Mostly subcortical white matter is affected. Mild associated T2 and FLAIR hyperintensity. No associated hemorrhage or mass effect.  No contralateral or posterior fossa restricted diffusion. Major intracranial vascular flow voids are within normal limits, dominant distal left vertebral artery.  Scattered small bilateral cerebral white matter mostly subcortical foci of nonspecific T2 and FLAIR hyperintensity. No cortical encephalomalacia or chronic cerebral blood products. Deep gray matter nuclei, brainstem, and cerebellum are within normal limits.  Visible internal auditory structures appear normal. Mild mastoid effusions, more so on the right. Trace retained secretions in the nasopharynx. Negative paranasal sinuses,  orbits soft tissues, scalp soft tissues, and visualized cervical spine. Normal bone marrow signal.  MRA HEAD FINDINGS  Antegrade flow in the posterior circulation. The non dominant distal right vertebral artery functionally terminates in PICA, with only a diminutive contribution to the basilar. Normal left PICA origin. No basilar artery stenosis. Normal SCA and PCA origins. Posterior communicating arteries are diminutive or absent. Bilateral PCA branches are within normal limits.  Antegrade flow in both ICA siphons. There is mild to moderate siphon irregularity greater on the left, without hemodynamically significant siphon stenosis. Normal ophthalmic artery origins. Patent carotid termini. Normal MCA and ACA origins.  Anterior communicating artery and visualized bilateral ACA branches are within normal limits. Right MCA M1 segment and visualized branches are within normal limits. Left MCA M1 segment and bifurcation are patent. No left MCA branch occlusion is identified.  IMPRESSION: 1. Several scattered, small acute left MCA territory infarcts in the premotor area. No mass effect or hemorrhage. 2. Left ICA siphon atherosclerosis but no hemodynamically significant stenosis or left MCA branch occlusion identified on intracranial MRA. 3. Underlying mild to moderate for age nonspecific subcortical white matter signal changes. 4. Mild mastoid effusions.   Electronically Signed   By: Odessa Fleming M.D.   On: 04/29/2015 07:32   [name] Head/brain Wo Cm  04/29/2015   CLINICAL DATA:  59 year old male with acute onset slurred speech and dysarthria upon waking. Initial encounter.  EXAM: MRI HEAD WITHOUT CONTRAST  MRA HEAD WITHOUT CONTRAST  TECHNIQUE: Multiplanar, multiecho pulse sequences of the brain and surrounding structures were obtained without intravenous contrast. Angiographic images of the head were obtained using MRA technique without contrast.  COMPARISON:  Head CT without contrast 04/28/2015.  FINDINGS: MRI HEAD  FINDINGS  Cerebral volume is within normal limits for age. Several scattered, small (up to 15 mm) foci of restricted diffusion in the left MCA territory affecting the inferior aspect of the superior frontal gyrus/middle frontal gyrus in the pre motor area. Mostly subcortical white matter is affected. Mild associated T2 and FLAIR hyperintensity. No associated hemorrhage or mass effect.  No contralateral or posterior fossa restricted diffusion. Major intracranial vascular flow voids are within normal limits, dominant distal left vertebral artery.  Scattered small bilateral cerebral white matter mostly subcortical foci of nonspecific T2 and FLAIR hyperintensity. No cortical encephalomalacia or chronic cerebral blood products. Deep gray matter nuclei, brainstem, and cerebellum are within normal limits.  Visible internal auditory structures appear normal. Mild mastoid effusions, more so on the right. Trace retained secretions in the nasopharynx. Negative paranasal sinuses, orbits soft tissues, scalp soft tissues, and visualized cervical spine. Normal bone marrow signal.  MRA HEAD  FINDINGS  Antegrade flow in the posterior circulation. The non dominant distal right vertebral artery functionally terminates in PICA, with only a diminutive contribution to the basilar. Normal left PICA origin. No basilar artery stenosis. Normal SCA and PCA origins. Posterior communicating arteries are diminutive or absent. Bilateral PCA branches are within normal limits.  Antegrade flow in both ICA siphons. There is mild to moderate siphon irregularity greater on the left, without hemodynamically significant siphon stenosis. Normal ophthalmic artery origins. Patent carotid termini. Normal MCA and ACA origins.  Anterior communicating artery and visualized bilateral ACA branches are within normal limits. Right MCA M1 segment and visualized branches are within normal limits. Left MCA M1 segment and bifurcation are patent. No left MCA branch  occlusion is identified.  IMPRESSION: 1. Several scattered, small acute left MCA territory infarcts in the premotor area. No mass effect or hemorrhage. 2. Left ICA siphon atherosclerosis but no hemodynamically significant stenosis or left MCA branch occlusion identified on intracranial MRA. 3. Underlying mild to moderate for age nonspecific subcortical white matter signal changes. 4. Mild mastoid effusions.   Electronically Signed   By: Odessa Fleming M.D.   On: 04/29/2015 07:32    ECG  Chronic LBBB no t wave abnormalities  ASSESSMENT AND PLAN  1. L MCA premotor scattered infarct   - slurring of speech has largely resolved.  2. Abnormal aortic valve rounded density seen on echo  - WBC normal, no fever or chill, does not have signs of SBE, however symptom concerning for scattered emboli, the lesion seen on echo likely represent calcified plaque (myxmoma rare)  - agree with need for TEE +/- loop, this can likely be done as outpatient. Will check with MD. Unclear if need systemic anticoagulation, will defer to neurology  Addendum: Card master notified, will arrange outpatient TEE on Monday and contact patient to come in later in the week for outpatient TEE  3. LBBB  4. HTN  5. OSA on CPAP  6. Dilated aortic root: 48mm  Signed, Azalee Course, PA-C 04/29/2015, 4:36 PM   Attending Note:   The patient was seen and examined.  Agree with assessment and plan as noted above.  Changes made to the above note as needed.  1. TIA:  Work up thus far is negative. Will arrange a TEE as OP  2. Calcification near AV.;  Will be able to see this with TEE  He may be DC'd to home tonight  Will arrange for a tee as OP    Vesta Mixer, Montez Hageman., MD, [facility] 04/29/2015, 5:34 PM [address]  [address] Office [phone] Pager [phone]

## 2015-04-30 LAB — HEMOGLOBIN A1C
HEMOGLOBIN A1C: 5.1 % (ref 4.8–5.6)
MEAN PLASMA GLUCOSE: 100 mg/dL

## 2015-05-02 ENCOUNTER — Telehealth: Payer: Self-pay | Admitting: Cardiovascular Disease

## 2015-05-02 NOTE — Telephone Encounter (Signed)
Scheduled patient for TEE with Dr. Elease Hashimoto on May 16, 2015 at 9:00. Patient is not to eat or drink after midnight and arrive at hospital at 7:30.

## 2015-05-02 NOTE — Telephone Encounter (Signed)
New Message   Pt is calling to schedule TEE per hospital discharge   No order listed please call pt

## 2015-05-16 ENCOUNTER — Encounter (HOSPITAL_COMMUNITY)
Admission: RE | Disposition: A | Discharge status: Home / Self Care | Payer: Self-pay | Source: Ambulatory Visit | Attending: Cardiovascular Disease

## 2015-05-16 ENCOUNTER — Encounter (HOSPITAL_COMMUNITY): Payer: Self-pay

## 2015-05-16 ENCOUNTER — Ambulatory Visit (HOSPITAL_BASED_OUTPATIENT_CLINIC_OR_DEPARTMENT_OTHER): Payer: BLUE CROSS/BLUE SHIELD

## 2015-05-16 ENCOUNTER — Ambulatory Visit (HOSPITAL_COMMUNITY)
Admission: RE | Admit: 2015-05-16 | Discharge: 2015-05-16 | Disposition: A | Discharge status: Home / Self Care | Payer: BLUE CROSS/BLUE SHIELD | Source: Ambulatory Visit | Attending: Cardiovascular Disease | Admitting: Cardiovascular Disease

## 2015-05-16 DIAGNOSIS — I639 Cerebral infarction, unspecified: Secondary | ICD-10-CM

## 2015-05-16 DIAGNOSIS — I10 Essential (primary) hypertension: Secondary | ICD-10-CM | POA: Diagnosis not present

## 2015-05-16 DIAGNOSIS — G4733 Obstructive sleep apnea (adult) (pediatric): Secondary | ICD-10-CM | POA: Diagnosis not present

## 2015-05-16 DIAGNOSIS — Z87891 Personal history of nicotine dependence: Secondary | ICD-10-CM | POA: Diagnosis not present

## 2015-05-16 DIAGNOSIS — Q238 Other congenital malformations of aortic and mitral valves: Secondary | ICD-10-CM | POA: Diagnosis not present

## 2015-05-16 DIAGNOSIS — Z8673 Personal history of transient ischemic attack (TIA), and cerebral infarction without residual deficits: Secondary | ICD-10-CM | POA: Diagnosis not present

## 2015-05-16 DIAGNOSIS — I447 Left bundle-branch block, unspecified: Secondary | ICD-10-CM | POA: Insufficient documentation

## 2015-05-16 DIAGNOSIS — I77810 Thoracic aortic ectasia: Secondary | ICD-10-CM | POA: Diagnosis not present

## 2015-05-16 HISTORY — PX: TEE WITHOUT CARDIOVERSION: SHX5443

## 2015-05-16 SURGERY — ECHOCARDIOGRAM, TRANSESOPHAGEAL
Anesthesia: Moderate Sedation

## 2015-05-16 MED ORDER — FENTANYL CITRATE (PF) 100 MCG/2ML IJ SOLN
INTRAMUSCULAR | Status: AC
  Filled 2015-05-16: qty 2

## 2015-05-16 MED ORDER — MIDAZOLAM HCL 5 MG/ML IJ SOLN
INTRAMUSCULAR | Status: AC
  Filled 2015-05-16: qty 2

## 2015-05-16 MED ORDER — BUTAMBEN-TETRACAINE-BENZOCAINE 2-2-14 % EX AERO
INHALATION_SPRAY | CUTANEOUS | Status: DC | PRN
  Administered 2015-05-16: 2 via TOPICAL

## 2015-05-16 MED ORDER — FENTANYL CITRATE (PF) 100 MCG/2ML IJ SOLN
INTRAMUSCULAR | Status: DC | PRN
  Administered 2015-05-16 (×2): 25 ug via INTRAVENOUS
  Administered 2015-05-16: 50 ug via INTRAVENOUS
  Administered 2015-05-16: 25 ug via INTRAVENOUS

## 2015-05-16 MED ORDER — SODIUM CHLORIDE 0.9 % IV SOLN: INTRAVENOUS | Status: DC

## 2015-05-16 MED ORDER — DIPHENHYDRAMINE HCL 50 MG/ML IJ SOLN
INTRAMUSCULAR | Status: DC | PRN
  Administered 2015-05-16: 25 mg via INTRAVENOUS

## 2015-05-16 MED ORDER — DIPHENHYDRAMINE HCL 50 MG/ML IJ SOLN
INTRAMUSCULAR | Status: AC
Start: 2015-05-16 — End: 2015-05-16
  Filled 2015-05-16: qty 1

## 2015-05-16 MED ORDER — MIDAZOLAM HCL 10 MG/2ML IJ SOLN
INTRAMUSCULAR | Status: DC | PRN
  Administered 2015-05-16 (×5): 2 mg via INTRAVENOUS

## 2015-05-16 MED ORDER — SODIUM CHLORIDE 0.9 % IV SOLN
INTRAVENOUS | Status: DC
  Administered 2015-05-16: 500 mL via INTRAVENOUS

## 2015-05-16 NOTE — CV Procedure (Signed)
    Transesophageal Echocardiogram Note  Christian Mcintyre [id number] 13-Aug-1956  Procedure: Transesophageal Echocardiogram Indications: CVA   Procedure Details Consent: Obtained Time Out: Verified patient identification, verified procedure, site/side was marked, verified correct patient position, special equipment/implants available, Radiology Safety Procedures followed,  medications/allergies/relevent history reviewed, required imaging and test results available.  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 Moderate AI   Tricuspid Valve: normal   Pulmonic Valve: normal   Left Atrium/ Left atrial appendage: no thrombi   Atrial septum: intact.  No PFO or ASD by color flow and bubble contrast   Aorta: mild calcification    Complications: No apparent complications Patient did tolerate procedure well.   Christian Mcintyre, Christian Mcintyre., MD, [facility] 05/16/2015, 9:26 AM

## 2015-05-16 NOTE — Discharge Instructions (Addendum)
Conscious Sedation, Adult, Care After Refer to this sheet in the next few weeks. These instructions provide you with information on caring for yourself after your procedure. Your health care provider may also give you more specific instructions. Your treatment has been planned according to current medical practices, but problems sometimes occur. Call your health care provider if you have any problems or questions after your procedure. WHAT TO EXPECT AFTER THE PROCEDURE  After your procedure:  You may feel sleepy, clumsy, and have poor balance for several hours.  Vomiting may occur if you eat too soon after the procedure. HOME CARE INSTRUCTIONS  Do not participate in any activities where you could become injured for at least 24 hours. Do not:  Drive.  Swim.  Ride a bicycle.  Operate heavy machinery.  Cook.  Use power tools.  Climb ladders.  Work from a high place.  Do not make important decisions or sign legal documents until you are improved.  If you vomit, drink water, juice, or soup when you can drink without vomiting. Make sure you have little or no nausea before eating solid foods.  Only take over-the-counter or prescription medicines for pain, discomfort, or fever as directed by your health care provider.  Make sure you and your family fully understand everything about the medicines given to you, including what side effects may occur.  You should not drink alcohol, take sleeping pills, or take medicines that cause drowsiness for at least 24 hours.  If you smoke, do not smoke without supervision.  If you are feeling better, you may resume normal activities 24 hours after you were sedated.  Keep all appointments with your health care provider. SEEK MEDICAL CARE IF:  Your skin is pale or bluish in color.  You continue to feel nauseous or vomit.  Your pain is getting worse and is not helped by medicine.  You have bleeding or swelling.  You are still sleepy or  feeling clumsy after 24 hours. SEEK IMMEDIATE MEDICAL CARE IF:  You develop a rash.  You have difficulty breathing.  You develop any type of allergic problem.  You have a fever. MAKE SURE YOU:  Understand these instructions.  Will watch your condition.  Will get help right away if you are not doing well or get worse. Document Released: 06/10/2013 Document Reviewed: 06/10/2013 [facility] Patient Information 2015 [place], [place]. This information is not intended to replace advice given to you by your health care provider. Make sure you discuss any questions you have with your health care provider.  Transesophageal Echocardiogram Transesophageal echocardiography (TEE) is a picture test of your heart using sound waves. The pictures taken can give very detailed pictures of your heart. This can help your doctor see if there are problems with your heart. TEE can check:  If your heart has blood clots in it.  How well your heart valves are working.  If you have an infection on the inside of your heart.  Some of the major arteries of your heart.  If your heart valve is working after a Psychologist, forensic.  Your heart before a procedure that uses a shock to your heart to get the rhythm back to normal. BEFORE THE PROCEDURE  Do not eat or drink for 6 hours before the procedure or as told by your doctor.  Make plans to have someone drive you home after the procedure. Do not drive yourself home.  An IV tube will be put in your arm. PROCEDURE  You will be given  a medicine to help you relax (sedative). It will be given through the IV tube.  A numbing medicine will be sprayed or gargled in the back of your throat to help numb it.  The tip of the probe is placed into the back of your mouth. You will be asked to swallow. This helps to pass the probe into your esophagus.  Once the tip of the probe is in the right place, your doctor can take pictures of your heart.  You may feel pressure at the back of  your throat. AFTER THE PROCEDURE  You will be taken to a recovery area so the sedative can wear off.  Your throat may be sore and scratchy. This will go away slowly over time.  You will go home when you are fully awake and able to swallow liquids.  You should have someone stay with you for the next 24 hours.  Do not drive or operate machinery for the next 24 hours. Document Released: 06/17/2009 Document Revised: 08/25/2013 Document Reviewed: 02/19/2013 [facility] Patient Information 2015 [place], [place]. This information is not intended to replace advice given to you by your health care provider. Make sure you discuss any questions you have with your health care provider.  Cardiac Event Monitoring A cardiac event monitor is a small recording device used to help detect abnormal heart rhythms (arrhythmias). The monitor is used to record heart rhythm when noticeable symptoms such as the following occur:  Fast heartbeats (palpitations), such as heart racing or fluttering.  Dizziness.  Fainting or light-headedness.  Unexplained weakness. The monitor is wired to two electrodes placed on your chest. Electrodes are flat, sticky disks that attach to your skin. The monitor can be worn for up to 30 days. You will wear the monitor at all times, except when bathing.  HOW TO USE YOUR CARDIAC EVENT MONITOR A technician will prepare your chest for the electrode placement. The technician will show you how to place the electrodes, how to work the monitor, and how to replace the batteries. Take time to practice using the monitor before you leave the office. Make sure you understand how to send the information from the monitor to your health care provider. This requires a telephone with a landline, not a cell phone. You need to:  Wear your monitor at all times, except when you are in water:  Do not get the monitor wet.  Take the monitor off when bathing. Do not swim or use a hot tub with it on.  Keep  your skin clean. Do not put body lotion or moisturizer on your chest.  Change the electrodes daily or any time they stop sticking to your skin. You might need to use tape to keep them on.  It is possible that your skin under the electrodes could become irritated. To keep this from happening, try to put the electrodes in slightly different places on your chest. However, they must remain in the area under your left breast and in the upper right section of your chest.  Make sure the monitor is safely clipped to your clothing or in a location close to your body that your health care provider recommends.  Press the button to record when you feel symptoms of heart trouble, such as dizziness, weakness, light-headedness, palpitations, thumping, shortness of breath, unexplained weakness, or a fluttering or racing heart. The monitor is always on and records what happened slightly before you pressed the button, so do not worry about being too late to  get good information.  Keep a diary of your activities, such as walking, doing chores, and taking medicine. It is especially important to note what you were doing when you pushed the button to record your symptoms. This will help your health care provider determine what might be contributing to your symptoms. The information stored in your monitor will be reviewed by your health care provider alongside your diary entries.  Send the recorded information as recommended by your health care provider. It is important to understand that it will take some time for your health care provider to process the results.  Change the batteries as recommended by your health care provider. SEEK IMMEDIATE MEDICAL CARE IF:   You have chest pain.  You have extreme difficulty breathing or shortness of breath.  You develop a very fast heartbeat that persists.  You develop dizziness that does not go away.  You faint or constantly feel you are about to faint. Document Released:  05/29/2008 Document Revised: 01/04/2014 Document Reviewed: 02/16/2013 [facility] Patient Information 2015 [place], [place]. This information is not intended to replace advice given to you by your health care provider. Make sure you discuss any questions you have with your health care provider.

## 2015-05-16 NOTE — H&P (View-Only) (Signed)
CARDIOLOGY CONSULT NOTE   Patient ID: Christian Mcintyre MRN: [id number], DOB/AGE: Apr 28, 1956   Admit date: 04/28/2015 Date of Consult: 04/29/2015   Primary Physician: Dennis Bast, MD Primary Cardiologist: new  Pt. Profile  59 year old Caucasian male with past medical history of hypertension, herniated disc, and obstructive sleep apnea on CPAP presented with stroke and found to have a mass on aortic valve.  Problem List  Past Medical History  Diagnosis Date  . Hypertension   . Sleep apnea     cpap  . Stroke     Past Surgical History  Procedure Laterality Date  . Lumbar l4/5 fusion in 2008 Bilateral 2008  . Back surgery      x2  . Hernia repair       Allergies  No Known Allergies  HPI   The patient is a pleasant 59 year old Caucasian male with past medical history of hypertension, herniated disc, and OSA on CPAP. He denies any past cardiac history and denies any family history of CAD. He has been compliant with his medication. In the last 2 months, he has been having significant back pain, limiting his ability to do strenuous activity. However prior to that, he was working in his yard without any discomfort. He denies any illicit drug use in the recent years. He did smoke for 20 years, however quit more than 10 years ago. He drinks one glass of wine per night. He denies any recent fever, chill, cough, lower extremity edema, chest discomfort or shortness breath. He was essentially in his usual state of health until he woke up in the morning of 04/28/2015 with slurring of speech. The symptom improved, however returned later prompting the patient to seek medical attention at [facility].  He was admitted to the internal medicine service. Initial CT of the brain was negative for acute process. However follow-up MRA of the brain showed several scattered small acute left MCA territory infarct in the premotor area. Carotid ultrasound did not reveals any significant stenosis.  Echocardiogram was obtained on 04/29/2015 which showed EF 50-55%, grade 1 diastolic dysfunction, calcified rounded density in the ventricular side of aortic valve measures 1.04 x 1.32 cm and emanates into the LVOT of unclear etiology, and may represent focal calcifications part of calcific AS but cannot rule out vegetation, TEE was recommended. There was mild AS and aVR, aortic root was moderately dilated at 48 mm. Cardiology has been consulted. Per neurology, patient likely need a TEE and possibly loop recorder placement. EKG showed chronic left bundle branch block.  Inpatient Medications  . atorvastatin  40 mg Oral q1800  . enoxaparin (LOVENOX) injection  40 mg Subcutaneous QHS  . sodium chloride  3 mL Intravenous Q12H    Family History Family History  Problem Relation Age of Onset  . Hypertension Brother      Social History Social History   Social History  . Marital Status: Married    Spouse Name: N/A  . Number of Children: N/A  . Years of Education: N/A   Occupational History  . Not on file.   Social History Main Topics  . Smoking status: Former Smoker    Quit date: 09/02/1998  . Smokeless tobacco: Not on file  . Alcohol Use: 0.0 oz/week    0 Standard drinks or equivalent per week     Comment: daily wine  . Drug Use: No  . Sexual Activity: Not on file   Other Topics Concern  . Not on file   Social History  Narrative     Review of Systems  General:  No chills, fever, night sweats or weight changes.  Cardiovascular:  No chest pain, dyspnea on exertion, edema, orthopnea, palpitations, paroxysmal nocturnal dyspnea. Dermatological: No rash, lesions/masses Respiratory: No cough, dyspnea Urologic: No hematuria, dysuria Abdominal:   No nausea, vomiting, diarrhea, bright red blood per rectum, melena, or hematemesis Neurologic:  No visual changes, wkns, changes in mental status. +slurring of speech, denies any ipsilateral weakness All other systems reviewed and are  otherwise negative except as noted above.  Physical Exam  Blood pressure 143/95, pulse 75, temperature 98.4 F (36.9 C), temperature source Oral, resp. rate 17, height 6\' 2"  (1.88 m), weight 235 lb 10.8 oz (106.9 kg), SpO2 98 %.  General: Pleasant, NAD Psych: Normal affect. Neuro: Alert and oriented X 3. Moves all extremities spontaneously. HEENT: Normal  Neck: Supple without bruits or JVD. Lungs:  Resp regular and unlabored, CTA. Heart: RRR no s3, s4, or murmurs. Abdomen: Soft, non-tender, non-distended, BS + x 4.  Extremities: No clubbing, cyanosis or edema. DP/PT/Radials 2+ and equal bilaterally.  Labs   Recent Labs  04/28/15 1850  TROPONINI <0.03   Lab Results  Component Value Date   WBC 4.7 04/29/2015   HGB 13.7 04/29/2015   HCT 40.8 04/29/2015   MCV 98.6 04/29/2015   PLT 228 04/29/2015     Recent Labs Lab 04/29/15 0428  NA 138  K 3.3*  CL 101  CO2 30  BUN 6  CREATININE 1.04  CALCIUM 9.1  PROT 6.5  BILITOT 0.9  ALKPHOS 49  ALT 23  AST 28  GLUCOSE 97   Lab Results  Component Value Date   CHOL 186 04/29/2015   HDL 80 04/29/2015   LDLCALC 89 04/29/2015   TRIG 84 04/29/2015   No results found for: DDIMER  Radiology/Studies  Dg Chest 2 View  04/29/2015   CLINICAL DATA:  TIA symptoms, history of hypertension and previous tobacco use.  EXAM: CHEST  2 VIEW  COMPARISON:  None in PACs  FINDINGS: The lungs are adequately inflated and clear. The heart and pulmonary vascularity are normal. The mediastinum is normal in width. There is no pleural effusion. There is mild degenerative disc disease of the thoracic spine.  IMPRESSION: There is no active cardiopulmonary disease.   Electronically Signed   By: David  [place] M.D.   On: 04/29/2015 07:58   Ct Head Wo Contrast  04/28/2015   CLINICAL DATA:  The patient is having issues with his speech since this morning.  EXAM: CT HEAD WITHOUT CONTRAST  TECHNIQUE: Contiguous axial images were obtained from the base of the  skull through the vertex without intravenous contrast.  COMPARISON:  None.  FINDINGS: There is no midline shift, hydrocephalus, or mass. No acute hemorrhage or acute transcortical infarct is identified. There is chronic diffuse atrophy. The bony calvarium is intact. The visualized sinuses are clear.  IMPRESSION: No focal acute intracranial abnormality identified.  Chronic diffuse atrophy.   Electronically Signed   By: Sherian Rein M.D.   On: 04/28/2015 19:34   Mr Brain Wo Contrast  04/29/2015   CLINICAL DATA:  59 year old male with acute onset slurred speech and dysarthria upon waking. Initial encounter.  EXAM: MRI HEAD WITHOUT CONTRAST  MRA HEAD WITHOUT CONTRAST  TECHNIQUE: Multiplanar, multiecho pulse sequences of the brain and surrounding structures were obtained without intravenous contrast. Angiographic images of the head were obtained using MRA technique without contrast.  COMPARISON:  Head CT without contrast 04/28/2015.  FINDINGS: MRI HEAD FINDINGS  Cerebral volume is within normal limits for age. Several scattered, small (up to 15 mm) foci of restricted diffusion in the left MCA territory affecting the inferior aspect of the superior frontal gyrus/middle frontal gyrus in the pre motor area. Mostly subcortical white matter is affected. Mild associated T2 and FLAIR hyperintensity. No associated hemorrhage or mass effect.  No contralateral or posterior fossa restricted diffusion. Major intracranial vascular flow voids are within normal limits, dominant distal left vertebral artery.  Scattered small bilateral cerebral white matter mostly subcortical foci of nonspecific T2 and FLAIR hyperintensity. No cortical encephalomalacia or chronic cerebral blood products. Deep gray matter nuclei, brainstem, and cerebellum are within normal limits.  Visible internal auditory structures appear normal. Mild mastoid effusions, more so on the right. Trace retained secretions in the nasopharynx. Negative paranasal sinuses,  orbits soft tissues, scalp soft tissues, and visualized cervical spine. Normal bone marrow signal.  MRA HEAD FINDINGS  Antegrade flow in the posterior circulation. The non dominant distal right vertebral artery functionally terminates in PICA, with only a diminutive contribution to the basilar. Normal left PICA origin. No basilar artery stenosis. Normal SCA and PCA origins. Posterior communicating arteries are diminutive or absent. Bilateral PCA branches are within normal limits.  Antegrade flow in both ICA siphons. There is mild to moderate siphon irregularity greater on the left, without hemodynamically significant siphon stenosis. Normal ophthalmic artery origins. Patent carotid termini. Normal MCA and ACA origins.  Anterior communicating artery and visualized bilateral ACA branches are within normal limits. Right MCA M1 segment and visualized branches are within normal limits. Left MCA M1 segment and bifurcation are patent. No left MCA branch occlusion is identified.  IMPRESSION: 1. Several scattered, small acute left MCA territory infarcts in the premotor area. No mass effect or hemorrhage. 2. Left ICA siphon atherosclerosis but no hemodynamically significant stenosis or left MCA branch occlusion identified on intracranial MRA. 3. Underlying mild to moderate for age nonspecific subcortical white matter signal changes. 4. Mild mastoid effusions.   Electronically Signed   By: Odessa Fleming M.D.   On: 04/29/2015 07:32   [name] Head/brain Wo Cm  04/29/2015   CLINICAL DATA:  59 year old male with acute onset slurred speech and dysarthria upon waking. Initial encounter.  EXAM: MRI HEAD WITHOUT CONTRAST  MRA HEAD WITHOUT CONTRAST  TECHNIQUE: Multiplanar, multiecho pulse sequences of the brain and surrounding structures were obtained without intravenous contrast. Angiographic images of the head were obtained using MRA technique without contrast.  COMPARISON:  Head CT without contrast 04/28/2015.  FINDINGS: MRI HEAD  FINDINGS  Cerebral volume is within normal limits for age. Several scattered, small (up to 15 mm) foci of restricted diffusion in the left MCA territory affecting the inferior aspect of the superior frontal gyrus/middle frontal gyrus in the pre motor area. Mostly subcortical white matter is affected. Mild associated T2 and FLAIR hyperintensity. No associated hemorrhage or mass effect.  No contralateral or posterior fossa restricted diffusion. Major intracranial vascular flow voids are within normal limits, dominant distal left vertebral artery.  Scattered small bilateral cerebral white matter mostly subcortical foci of nonspecific T2 and FLAIR hyperintensity. No cortical encephalomalacia or chronic cerebral blood products. Deep gray matter nuclei, brainstem, and cerebellum are within normal limits.  Visible internal auditory structures appear normal. Mild mastoid effusions, more so on the right. Trace retained secretions in the nasopharynx. Negative paranasal sinuses, orbits soft tissues, scalp soft tissues, and visualized cervical spine. Normal bone marrow signal.  MRA HEAD  FINDINGS  Antegrade flow in the posterior circulation. The non dominant distal right vertebral artery functionally terminates in PICA, with only a diminutive contribution to the basilar. Normal left PICA origin. No basilar artery stenosis. Normal SCA and PCA origins. Posterior communicating arteries are diminutive or absent. Bilateral PCA branches are within normal limits.  Antegrade flow in both ICA siphons. There is mild to moderate siphon irregularity greater on the left, without hemodynamically significant siphon stenosis. Normal ophthalmic artery origins. Patent carotid termini. Normal MCA and ACA origins.  Anterior communicating artery and visualized bilateral ACA branches are within normal limits. Right MCA M1 segment and visualized branches are within normal limits. Left MCA M1 segment and bifurcation are patent. No left MCA branch  occlusion is identified.  IMPRESSION: 1. Several scattered, small acute left MCA territory infarcts in the premotor area. No mass effect or hemorrhage. 2. Left ICA siphon atherosclerosis but no hemodynamically significant stenosis or left MCA branch occlusion identified on intracranial MRA. 3. Underlying mild to moderate for age nonspecific subcortical white matter signal changes. 4. Mild mastoid effusions.   Electronically Signed   By: Odessa Fleming M.D.   On: 04/29/2015 07:32    ECG  Chronic LBBB no t wave abnormalities  ASSESSMENT AND PLAN  1. L MCA premotor scattered infarct   - slurring of speech has largely resolved.  2. Abnormal aortic valve rounded density seen on echo  - WBC normal, no fever or chill, does not have signs of SBE, however symptom concerning for scattered emboli, the lesion seen on echo likely represent calcified plaque (myxmoma rare)  - agree with need for TEE +/- loop, this can likely be done as outpatient. Will check with MD. Unclear if need systemic anticoagulation, will defer to neurology  Addendum: Card master notified, will arrange outpatient TEE on Monday and contact patient to come in later in the week for outpatient TEE  3. LBBB  4. HTN  5. OSA on CPAP  6. Dilated aortic root: 48mm  Signed, Azalee Course, PA-C 04/29/2015, 4:36 PM   Attending Note:   The patient was seen and examined.  Agree with assessment and plan as noted above.  Changes made to the above note as needed.  1. TIA:  Work up thus far is negative. Will arrange a TEE as OP  2. Calcification near AV.;  Will be able to see this with TEE  He may be DC'd to home tonight  Will arrange for a tee as OP    Vesta Mixer, Montez Hageman., MD, [facility] 04/29/2015, 5:34 PM [address]  [address] Office [phone] Pager [phone]

## 2015-05-16 NOTE — Interval H&P Note (Signed)
History and Physical Interval Note:  05/16/2015 9:02 AM  Christian Mcintyre  has presented today for surgery, with the diagnosis of ABNORMAL VALVES  The various methods of treatment have been discussed with the patient and family. After consideration of risks, benefits and other options for treatment, the patient has consented to  Procedure(s): TRANSESOPHAGEAL ECHOCARDIOGRAM (TEE) (N/A) as a surgical intervention .  The patient's history has been reviewed, patient examined, no change in status, stable for surgery.  I have reviewed the patient's chart and labs.  Questions were answered to the patient's satisfaction.     [name], Deloris Ping

## 2015-05-16 NOTE — Interval H&P Note (Signed)
History and Physical Interval Note:  05/16/2015 9:04 AM  Christian Mcintyre  has presented today for surgery, with the diagnosis of ABNORMAL VALVES  The various methods of treatment have been discussed with the patient and family. After consideration of risks, benefits and other options for treatment, the patient has consented to  Procedure(s): TRANSESOPHAGEAL ECHOCARDIOGRAM (TEE) (N/A) as a surgical intervention .  The patient's history has been reviewed, patient examined, no change in status, stable for surgery.  I have reviewed the patient's chart and labs.  Questions were answered to the patient's satisfaction.     Etienne Mowers, Deloris Ping

## 2015-05-18 ENCOUNTER — Encounter (HOSPITAL_COMMUNITY): Payer: Self-pay | Admitting: Cardiovascular Disease

## 2015-05-20 ENCOUNTER — Other Ambulatory Visit (HOSPITAL_BASED_OUTPATIENT_CLINIC_OR_DEPARTMENT_OTHER): Payer: Self-pay | Admitting: Neurosurgery

## 2015-05-20 ENCOUNTER — Ambulatory Visit (HOSPITAL_BASED_OUTPATIENT_CLINIC_OR_DEPARTMENT_OTHER)
Admission: RE | Admit: 2015-05-20 | Discharge: 2015-05-20 | Disposition: A | Discharge status: Home / Self Care | Payer: BLUE CROSS/BLUE SHIELD | Source: Ambulatory Visit | Attending: Neurosurgery | Admitting: Neurosurgery

## 2015-05-20 DIAGNOSIS — M5137 Other intervertebral disc degeneration, lumbosacral region: Secondary | ICD-10-CM | POA: Insufficient documentation

## 2015-05-31 ENCOUNTER — Ambulatory Visit (HOSPITAL_COMMUNITY)
Admission: RE | Admit: 2015-05-31 | Discharge: 2015-05-31 | Disposition: A | Discharge status: Home / Self Care | Payer: BLUE CROSS/BLUE SHIELD | Source: Ambulatory Visit | Attending: Internal Medicine | Admitting: Internal Medicine

## 2015-05-31 ENCOUNTER — Encounter (HOSPITAL_COMMUNITY)
Admission: RE | Disposition: A | Discharge status: Home / Self Care | Payer: Self-pay | Source: Ambulatory Visit | Attending: Internal Medicine

## 2015-05-31 DIAGNOSIS — Z7982 Long term (current) use of aspirin: Secondary | ICD-10-CM | POA: Diagnosis not present

## 2015-05-31 DIAGNOSIS — Z87891 Personal history of nicotine dependence: Secondary | ICD-10-CM | POA: Insufficient documentation

## 2015-05-31 DIAGNOSIS — Z79899 Other long term (current) drug therapy: Secondary | ICD-10-CM | POA: Insufficient documentation

## 2015-05-31 DIAGNOSIS — G459 Transient cerebral ischemic attack, unspecified: Secondary | ICD-10-CM | POA: Diagnosis present

## 2015-05-31 DIAGNOSIS — G4730 Sleep apnea, unspecified: Secondary | ICD-10-CM | POA: Insufficient documentation

## 2015-05-31 DIAGNOSIS — I639 Cerebral infarction, unspecified: Secondary | ICD-10-CM | POA: Diagnosis not present

## 2015-05-31 DIAGNOSIS — I10 Essential (primary) hypertension: Secondary | ICD-10-CM | POA: Insufficient documentation

## 2015-05-31 HISTORY — PX: EP IMPLANTABLE DEVICE: SHX172B

## 2015-05-31 SURGERY — LOOP RECORDER INSERTION

## 2015-05-31 MED ORDER — LIDOCAINE-EPINEPHRINE 1 %-1:100000 IJ SOLN
INTRAMUSCULAR | Status: AC
  Filled 2015-05-31: qty 1

## 2015-05-31 MED ORDER — LIDOCAINE-EPINEPHRINE 1 %-1:100000 IJ SOLN
INTRAMUSCULAR | Status: DC | PRN
  Administered 2015-05-31: 30 mL

## 2015-05-31 SURGICAL SUPPLY — 2 items
LOOP REVEAL LINQSYS (Prosthesis & Implant Heart) ×3 IMPLANT
PACK LOOP INSERTION (CUSTOM PROCEDURE TRAY) ×3 IMPLANT

## 2015-05-31 NOTE — H&P (View-Only) (Signed)
ELECTROPHYSIOLOGY CONSULT NOTE  Patient ID: Christian Mcintyre MRN: [id number], DOB/AGE: Mar 09, 1956   Date of Consult: 05/31/2015  Primary Physician: Dennis Bast, MD Primary Cardiologist: Nahser Reason for Consultation: Cryptogenic stroke; recommendations regarding Implantable Loop Recorder  History of Present Illness Christian Mcintyre was admitted in august of this year with dysarthria and word finding difficulties.   He woke up around 6AM on the morning of admission with slurred speech which shortly resolved but then intermittently continued throughout the day.  He presented to the hospital for evaluation and was found to have several scattered, small acute left MCA territory infarcts in the premotor area.  Inpatient workup for stroke that admission included echocardiogram and carotid dopplers.  The patient was monitored on telemetry which has demonstrated sinus rhythm with no arrhythmias.    Echocardiogram 04/2015 demonstrated EF 50-55%, no RWMA, grade 1 diastolic dysfunction, calcified density on aortic valve.   TEE 05/16/15 demonstrated normal EF, no evidence of aortic vegetation, moderate AR.   The patient denies chest pain, shortness of breath, dizziness, palpitations, or syncope.     EP has been asked to evaluate for placement of an implantable loop recorder to monitor for atrial fibrillation.   Past Medical History  Diagnosis Date  . Hypertension   . Sleep apnea     cpap  . Stroke      Surgical History:  Past Surgical History  Procedure Laterality Date  . Lumbar l4/5 fusion in 2008 Bilateral 2008  . Back surgery      x2  . Hernia repair    . Tee without cardioversion N/A 05/16/2015    Procedure: TRANSESOPHAGEAL ECHOCARDIOGRAM (TEE);  Surgeon: Vesta Mixer, MD;  Location: [facility] ENDOSCOPY;  Service: Cardiovascular;  Laterality: N/A;     Prescriptions prior to admission  Medication Sig Dispense Refill Last Dose  . aspirin EC 325 MG EC tablet Take 1 tablet (325 mg total) by mouth  daily. 30 tablet 0 05/31/2015 at 0530   . atorvastatin (LIPITOR) 40 MG tablet Take 1 tablet (40 mg total) by mouth daily at 6 PM. 30 tablet 0 05/31/2015 at 0530  . valsartan-hydrochlorothiazide (DIOVAN-HCT) 320-12.5 MG per tablet Take 1 tablet by mouth daily.   05/31/2015 at 0530    Allergies: No Known Allergies  Social History   Social History  . Marital Status: Married    Spouse Name: N/A  . Number of Children: N/A  . Years of Education: N/A   Occupational History  . Not on file.   Social History Main Topics  . Smoking status: Former Smoker    Quit date: 09/02/1998  . Smokeless tobacco: Not on file  . Alcohol Use: 0.0 oz/week    0 Standard drinks or equivalent per week     Comment: daily wine  . Drug Use: No  . Sexual Activity: Not on file   Other Topics Concern  . Not on file   Social History Narrative     Family History  Problem Relation Age of Onset  . Hypertension Brother       Review of Systems: All other systems reviewed and are otherwise negative except as noted above.  Physical Exam: Filed Vitals:   05/31/15 0725  BP: 139/84  Pulse: 84  Temp: 98.2 F (36.8 C)  TempSrc: Oral  Resp: 18  Height:  (1.88 m)  Weight: 242 lb (109.77 kg)  SpO2: 95%    GEN- The patient is well appearing, alert and oriented x 3 today.   Head-  normocephalic, atraumatic Eyes-  Sclera clear, conjunctiva pink Ears- hearing intact Oropharynx- clear Neck- supple Lungs- Clear to ausculation bilaterally, normal work of breathing Heart- Regular rate and rhythm, no murmurs, rubs or gallops  GI- soft, NT, ND, + BS Extremities- no clubbing, cyanosis, or edema MS- no significant deformity or atrophy Skin- no rash or lesion Psych- euthymic mood, full affect   Labs:   Lab Results  Component Value Date   WBC 4.7 04/29/2015   HGB 13.7 04/29/2015   HCT 40.8 04/29/2015   MCV 98.6 04/29/2015   PLT 228 04/29/2015    12-lead ECG SR 1st degree AV block (PR 232), LAFB All  prior EKG's in EPIC reviewed with no documented atrial fibrillation  Assessment and Plan:  1. Cryptogenic stroke The patient has had a cryptogenic stroke. Recent TEE is reviewed and showed no cause for stroke identified. The patient was monitored on telemetry during recent admission with no atrial fibrillation documented.  I spoke at length with the patient about monitoring for afib with an implantable loop recorder.  Risks, benefits, and alteratives to implantable loop recorder were discussed with the patient today.   At this time, the patient is very clear in their decision to proceed with implantable loop recorder.   Wound care was reviewed with the patient (keep incision clean and dry for 3 days).  Wound check scheduled for 06-09-15 at [facility], NP 05/31/2015 7:28 AM  I have seen, examined the patient, and reviewed the above assessment and plan. On exam, RRR.  Mild dysdiodokinesis. Changes to above are made where necessary.    Co Sign: Hillis Range, MD 05/31/2015 7:35 AM

## 2015-05-31 NOTE — Interval H&P Note (Signed)
History and Physical Interval Note:  05/31/2015 7:37 AM  Christian Mcintyre  has presented today for surgery, with the diagnosis of cva  The various methods of treatment have been discussed with the patient and family. After consideration of risks, benefits and other options for treatment, the patient has consented to  Procedure(s): Loop Recorder Insertion (N/A) as a surgical intervention .  The patient's history has been reviewed, patient examined, no change in status, stable for surgery.  I have reviewed the patient's chart and labs.  Questions were answered to the patient's satisfaction.     Hillis Range

## 2015-05-31 NOTE — Consult Note (Signed)
  ELECTROPHYSIOLOGY CONSULT NOTE  Patient ID: Christian Mcintyre MRN: [id number], DOB/AGE: 03/28/1956   Date of Consult: 05/31/2015  Primary Physician: CABEZA,YURI, MD Primary Cardiologist: Nahser Reason for Consultation: Cryptogenic stroke; recommendations regarding Implantable Loop Recorder  History of Present Illness Christian Mcintyre was admitted in august of this year with dysarthria and word finding difficulties.   He woke up around 6AM on the morning of admission with slurred speech which shortly resolved but then intermittently continued throughout the day.  He presented to the hospital for evaluation and was found to have several scattered, small acute left MCA territory infarcts in the premotor area.  Inpatient workup for stroke that admission included echocardiogram and carotid dopplers.  The patient was monitored on telemetry which has demonstrated sinus rhythm with no arrhythmias.    Echocardiogram 04/2015 demonstrated EF 50-55%, no RWMA, grade 1 diastolic dysfunction, calcified density on aortic valve.   TEE 05/16/15 demonstrated normal EF, no evidence of aortic vegetation, moderate AR.   The patient denies chest pain, shortness of breath, dizziness, palpitations, or syncope.     EP has been asked to evaluate for placement of an implantable loop recorder to monitor for atrial fibrillation.   Past Medical History  Diagnosis Date  . Hypertension   . Sleep apnea     cpap  . Stroke      Surgical History:  Past Surgical History  Procedure Laterality Date  . Lumbar l4/5 fusion in 2008 Bilateral 2008  . Back surgery      x2  . Hernia repair    . Tee without cardioversion N/A 05/16/2015    Procedure: TRANSESOPHAGEAL ECHOCARDIOGRAM (TEE);  Surgeon: Philip J Nahser, MD;  Location: [facility];  Service: Cardiovascular;  Laterality: N/A;     Prescriptions prior to admission  Medication Sig Dispense Refill Last Dose  . aspirin EC 325 MG EC tablet Take 1 tablet (325 mg total) by mouth  daily. 30 tablet 0 05/31/2015 at 0530   . atorvastatin (LIPITOR) 40 MG tablet Take 1 tablet (40 mg total) by mouth daily at 6 PM. 30 tablet 0 05/31/2015 at 0530  . valsartan-hydrochlorothiazide (DIOVAN-HCT) 320-12.5 MG per tablet Take 1 tablet by mouth daily.   05/31/2015 at 0530    Allergies: No Known Allergies  Social History   Social History  . Marital Status: Married    Spouse Name: N/A  . Number of Children: N/A  . Years of Education: N/A   Occupational History  . Not on file.   Social History Main Topics  . Smoking status: Former Smoker    Quit date: 09/02/1998  . Smokeless tobacco: Not on file  . Alcohol Use: 0.0 oz/week    0 Standard drinks or equivalent per week     Comment: daily wine  . Drug Use: No  . Sexual Activity: Not on file   Other Topics Concern  . Not on file   Social History Narrative     Family History  Problem Relation Age of Onset  . Hypertension Brother       Review of Systems: All other systems reviewed and are otherwise negative except as noted above.  Physical Exam: Filed Vitals:   05/31/15 0725  BP: 139/84  Pulse: 84  Temp: 98.2 F (36.8 C)  TempSrc: Oral  Resp: 18  Height: 6' 2" (1.88 m)  Weight: 242 lb (109.77 kg)  SpO2: 95%    GEN- The patient is well appearing, alert and oriented x 3 today.   Head-   normocephalic, atraumatic Eyes-  Sclera clear, conjunctiva pink Ears- hearing intact Oropharynx- clear Neck- supple Lungs- Clear to ausculation bilaterally, normal work of breathing Heart- Regular rate and rhythm, no murmurs, rubs or gallops  GI- soft, NT, ND, + BS Extremities- no clubbing, cyanosis, or edema MS- no significant deformity or atrophy Skin- no rash or lesion Psych- euthymic mood, full affect   Labs:   Lab Results  Component Value Date   WBC 4.7 04/29/2015   HGB 13.7 04/29/2015   HCT 40.8 04/29/2015   MCV 98.6 04/29/2015   PLT 228 04/29/2015    12-lead ECG SR 1st degree AV block (PR 232), LAFB All  prior EKG's in EPIC reviewed with no documented atrial fibrillation  Assessment and Plan:  1. Cryptogenic stroke The patient has had a cryptogenic stroke. Recent TEE is reviewed and showed no cause for stroke identified. The patient was monitored on telemetry during recent admission with no atrial fibrillation documented.  I spoke at length with the patient about monitoring for afib with an implantable loop recorder.  Risks, benefits, and alteratives to implantable loop recorder were discussed with the patient today.   At this time, the patient is very clear in their decision to proceed with implantable loop recorder.   Wound care was reviewed with the patient (keep incision clean and dry for 3 days).  Wound check scheduled for 06-09-15 at 9AM  Seiler,Amber K, NP 05/31/2015 7:28 AM  I have seen, examined the patient, and reviewed the above assessment and plan. On exam, RRR.  Mild dysdiodokinesis. Changes to above are made where necessary.    Co Sign: James Allred, MD 05/31/2015 7:35 AM    

## 2015-06-01 ENCOUNTER — Encounter (HOSPITAL_COMMUNITY): Payer: Self-pay | Admitting: Internal Medicine

## 2015-06-09 ENCOUNTER — Ambulatory Visit (INDEPENDENT_AMBULATORY_CARE_PROVIDER_SITE_OTHER): Payer: BLUE CROSS/BLUE SHIELD | Admitting: *Deleted

## 2015-06-09 ENCOUNTER — Ambulatory Visit: Payer: BLUE CROSS/BLUE SHIELD

## 2015-06-09 DIAGNOSIS — Z4509 Encounter for adjustment and management of other cardiac device: Secondary | ICD-10-CM

## 2015-06-09 LAB — CUP PACEART INCLINIC DEVICE CHECK
MDC IDC SESS DTM: 20161006161810
MDC IDC SET ZONE DETECTION INTERVAL: 2000 ms
MDC IDC SET ZONE DETECTION INTERVAL: 350 ms
Zone Setting Detection Interval: 3000 ms

## 2015-06-09 NOTE — Progress Notes (Signed)
Loop wound check in clinic. Steri-strips removed. Incision well healed with no redness, swelling or drainage. Pt educated about wound care. No episodes. Monthly Carelink Summary Reports, ROV with JA PRN.

## 2015-06-27 ENCOUNTER — Encounter: Payer: Self-pay | Admitting: Internal Medicine

## 2015-06-30 ENCOUNTER — Ambulatory Visit (INDEPENDENT_AMBULATORY_CARE_PROVIDER_SITE_OTHER): Payer: BLUE CROSS/BLUE SHIELD | Admitting: *Deleted

## 2015-06-30 DIAGNOSIS — Z4509 Encounter for adjustment and management of other cardiac device: Secondary | ICD-10-CM

## 2015-06-30 NOTE — Progress Notes (Signed)
Loop recorder 

## 2015-07-11 ENCOUNTER — Ambulatory Visit (INDEPENDENT_AMBULATORY_CARE_PROVIDER_SITE_OTHER): Payer: BLUE CROSS/BLUE SHIELD | Admitting: Neurology

## 2015-07-11 ENCOUNTER — Encounter: Payer: Self-pay | Admitting: Neurology

## 2015-07-11 VITALS — BP 142/90 | HR 68 | Ht 74.0 in | Wt 249.6 lb

## 2015-07-11 DIAGNOSIS — I639 Cerebral infarction, unspecified: Secondary | ICD-10-CM | POA: Insufficient documentation

## 2015-07-11 NOTE — Progress Notes (Signed)
[facility] [address] [place] [id number] [phone]       OFFICE FOLLOW-UP NOTE  Christian. Christian Mcintyre Date of Birth:  1955/12/18 Medical Record Number:  [id number]   HPI: Christian Mcintyre is a 59 year old male seen today for first office follow-up visit following hospital admission for stroke in August 2016.Christian Mcintyre is a 59 y.o. male hx of HTN, tobacco use admitted with concern of dysarthria and word finding difficulties. Notes waking up at 0600 on 04/27/15 with slurred speech and difficulty getting his words out. Shortly after this resolved but then continued intermittently throughout the day. Denies any sensory or motor deficits. No visual changes. No headache. Denies any recent illnesses, no new medications. Normal routine this morning, didn't skip breakfast, normal night sleep. No prior CVA or TIA. CT head imaging reviewed and overall unremarkable.  Date last known well: 04/27/2015 Time last known well: 2200 tPA Given: no, outside IV tPA window Modified Rankin: Rankin Score=0  MRI scan of the brain which I personally reviewed showed several tiny scattered small frontal left MCA branch embolic infarcts. MRA of the brain was unremarkable except for mild atherosclerosis of the left carotid siphon. There are mild changes of nonspecific white matter small vessel disease. Transthoracic echo showed normal ejection fraction. Aortic valve is showed a calcified rounded density in the left ventricular outflow track and subsequently outpatient TEE was obtained on 05/16/15 which showed no evidence of aortic valve vegetation or clot. No PFO was found. Patient had loop recorder insertion done on 05/31/15 and so far no paroxysmal atrial fibrillation has not been found. He is tolerating aspirin well without significant bruising or bleeding and Lipitor without myalgias. He states his blood pressure is well controlled and today it is 142/90. He is changes diet and is exercising regularly but  has not yet lost weight. He plans to do so.  ROS:   14 system review of systems is positive for joint pain only and all systems negative  PMH:  Past Medical History  Diagnosis Date  . Hypertension   . Sleep apnea     cpap  . Stroke [facility](HCC)     Social History:  Social History   Social History  . Marital Status: Married    Spouse Name: N/A  . Number of Children: N/A  . Years of Education: N/A   Occupational History  . Not on file.   Social History Main Topics  . Smoking status: Former Smoker    Quit date: 09/02/1998  . Smokeless tobacco: Not on file  . Alcohol Use: 0.0 oz/week    0 Standard drinks or equivalent per week     Comment: daily wine  . Drug Use: No  . Sexual Activity: Not on file   Other Topics Concern  . Not on file   Social History Narrative    Medications:   Current Outpatient Prescriptions on File Prior to Visit  Medication Sig Dispense Refill  . aspirin EC 325 MG EC tablet Take 1 tablet (325 mg total) by mouth daily. 30 tablet 0  . atorvastatin (LIPITOR) 40 MG tablet Take 1 tablet (40 mg total) by mouth daily at 6 PM. 30 tablet 0  . valsartan-hydrochlorothiazide (DIOVAN-HCT) 320-12.5 MG per tablet Take 1 tablet by mouth daily.     No current facility-administered medications on file prior to visit.    Allergies:  No Known Allergies  Physical Exam General: obese middle-age Caucasian male, seated, in no evident distress Head: head normocephalic and  atraumatic.  Neck: supple with no carotid or supraclavicular bruits Cardiovascular: regular rate and rhythm, no murmurs Musculoskeletal: no deformity Skin:  no rash/petichiae Vascular:  Normal pulses all extremities Filed Vitals:   07/11/15 1544  BP: 142/90  Pulse: 68   Neurologic Exam Mental Status: Awake and fully alert. Oriented to place and time. Recent and remote memory intact. Attention span, concentration and fund of knowledge appropriate. Mood and affect appropriate.  Cranial Nerves:  Fundoscopic exam reveals sharp disc margins. Pupils equal, briskly reactive to light. Extraocular movements full without nystagmus. Visual fields full to confrontation. Hearing intact. Facial sensation intact. Face, tongue, palate moves normally and symmetrically.  Motor: Normal bulk and tone. Normal strength in all tested extremity muscles. Sensory.: intact to touch ,pinprick .position and vibratory sensation.  Coordination: Rapid alternating movements normal in all extremities. Finger-to-nose and heel-to-shin performed accurately bilaterally. Gait and Station: Arises from chair without difficulty. Stance is normal. Gait demonstrates normal stride length and balance . Able to heel, toe and tandem walk without difficulty.  Reflexes: 1+ and symmetric. Toes downgoing.   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 Vascular risk factors of hypertension and hyperlipidemia only.    PLAN: I had a long d/w patient about his recent stroke, risk for recurrent stroke/TIAs, personally independently reviewed imaging studies and stroke evaluation results and answered questions.Continue aspirin 325 mg daily  for secondary stroke prevention and maintain strict control of hypertension with blood pressure goal below 130/90, diabetes with hemoglobin A1c goal below 6.5% and lipids with LDL cholesterol goal below 100 mg/dL. I also advised the patient to eat a healthy diet with plenty of whole grains, cereals, fruits and vegetables, exercise regularly and maintain ideal body weight. He was also counseled to use his CPAP regularly. He was given information to review about possible participation in the RESPECT ESUS study.Greater than 50% of time during this 25 minute visit was spent on counseling,explanation of diagnosis, planning of further management, discussion with patient and family and coordination of care  Followup in the future with me in 3 months or call earlier if necessary.  Delia Heady, MD Note: This document was prepared with digital dictation and possible smart phrase technology. Any transcriptional errors that result from this process are unintentional

## 2015-07-11 NOTE — Patient Instructions (Signed)
I had a long d/w patient about his recent stroke, risk for recurrent stroke/TIAs, personally independently reviewed imaging studies and stroke evaluation results and answered questions.Continue aspirin 325 mg daily  for secondary stroke prevention and maintain strict control of hypertension with blood pressure goal below 130/90, diabetes with hemoglobin A1c goal below 6.5% and lipids with LDL cholesterol goal below 100 mg/dL. I also advised the patient to eat a healthy diet with plenty of whole grains, cereals, fruits and vegetables, exercise regularly and maintain ideal body weight. He was also counseled to use his CPAP regularly. He was given information to review about possible participation in the RESPECT ESUS study. Followup in the future with me in 3 months or call earlier if necessary.  Stroke Prevention Some medical conditions and behaviors are associated with an increased chance of having a stroke. You may prevent a stroke by making healthy choices and managing medical conditions. HOW CAN I REDUCE MY RISK OF HAVING A STROKE?   Stay physically active. Get at least 30 minutes of activity on most or all days.  Do not smoke. It may also be helpful to avoid exposure to secondhand smoke.  Limit alcohol use. Moderate alcohol use is considered to be:  No more than 2 drinks per day for men.  No more than 1 drink per day for nonpregnant women.  Eat healthy foods. This involves:  Eating 5 or more servings of fruits and vegetables a day.  Making dietary changes that address high blood pressure (hypertension), high cholesterol, diabetes, or obesity.  Manage your cholesterol levels.  Making food choices that are high in fiber and low in saturated fat, trans fat, and cholesterol may control cholesterol levels.  Take any prescribed medicines to control cholesterol as directed by your health care provider.  Manage your diabetes.  Controlling your carbohydrate and sugar intake is recommended to  manage diabetes.  Take any prescribed medicines to control diabetes as directed by your health care provider.  Control your hypertension.  Making food choices that are low in salt (sodium), saturated fat, trans fat, and cholesterol is recommended to manage hypertension.  Ask your health care provider if you need treatment to lower your blood pressure. Take any prescribed medicines to control hypertension as directed by your health care provider.  If you are 35-11 years of age, have your blood pressure checked every 3-5 years. If you are 39 years of age or older, have your blood pressure checked every year.  Maintain a healthy weight.  Reducing calorie intake and making food choices that are low in sodium, saturated fat, trans fat, and cholesterol are recommended to manage weight.  Stop drug abuse.  Avoid taking birth control pills.  Talk to your health care provider about the risks of taking birth control pills if you are over 98 years old, smoke, get migraines, or have ever had a blood clot.  Get evaluated for sleep disorders (sleep apnea).  Talk to your health care provider about getting a sleep evaluation if you snore a lot or have excessive sleepiness.  Take medicines only as directed by your health care provider.  For some people, aspirin or blood thinners (anticoagulants) are helpful in reducing the risk of forming abnormal blood clots that can lead to stroke. If you have the irregular heart rhythm of atrial fibrillation, you should be on a blood thinner unless there is a good reason you cannot take them.  Understand all your medicine instructions.  Make sure that other conditions (such  as anemia or atherosclerosis) are addressed. SEEK IMMEDIATE MEDICAL CARE IF:   You have sudden weakness or numbness of the face, arm, or leg, especially on one side of the body.  Your face or eyelid droops to one side.  You have sudden confusion.  You have trouble speaking (aphasia) or  understanding.  You have sudden trouble seeing in one or both eyes.  You have sudden trouble walking.  You have dizziness.  You have a loss of balance or coordination.  You have a sudden, severe headache with no known cause.  You have new chest pain or an irregular heartbeat. Any of these symptoms may represent a serious problem that is an emergency. Do not wait to see if the symptoms will go away. Get medical help at once. Call your local emergency services (911 in [place] Do not drive yourself to the hospital.   This information is not intended to replace advice given to you by your health care provider. Make sure you discuss any questions you have with your health care provider.   Document Released: 09/27/2004 Document Revised: 09/10/2014 Document Reviewed: 02/20/2013 Elsevier Interactive Patient Education [employer]2016 [employer] Inc.

## 2015-07-14 ENCOUNTER — Telehealth: Payer: Self-pay

## 2015-07-14 NOTE — Telephone Encounter (Signed)
I left a message for the patient to return my call.

## 2015-07-15 ENCOUNTER — Other Ambulatory Visit (HOSPITAL_BASED_OUTPATIENT_CLINIC_OR_DEPARTMENT_OTHER): Payer: Self-pay | Admitting: Neurosurgery

## 2015-07-15 ENCOUNTER — Ambulatory Visit (HOSPITAL_BASED_OUTPATIENT_CLINIC_OR_DEPARTMENT_OTHER)
Admission: RE | Admit: 2015-07-15 | Discharge: 2015-07-15 | Disposition: A | Discharge status: Home / Self Care | Payer: BLUE CROSS/BLUE SHIELD | Source: Ambulatory Visit | Attending: Neurosurgery | Admitting: Neurosurgery

## 2015-07-15 DIAGNOSIS — M5136 Other intervertebral disc degeneration, lumbar region: Secondary | ICD-10-CM | POA: Diagnosis present

## 2015-07-15 DIAGNOSIS — Z09 Encounter for follow-up examination after completed treatment for conditions other than malignant neoplasm: Secondary | ICD-10-CM | POA: Insufficient documentation

## 2015-07-18 ENCOUNTER — Telehealth: Payer: Self-pay

## 2015-07-18 NOTE — Telephone Encounter (Signed)
I left a message for the patient to return my call.

## 2015-07-22 ENCOUNTER — Telehealth: Payer: Self-pay

## 2015-07-22 NOTE — Telephone Encounter (Signed)
I left a message for the patient to return my call.

## 2015-07-29 LAB — CUP PACEART REMOTE DEVICE CHECK: Date Time Interrogation Session: 20161027110513

## 2015-07-29 NOTE — Progress Notes (Signed)
Carelink summary report received. Battery status OK. Normal device function. No new symptom episodes, tachy episodes, brady, or pause episodes. No new AF episodes. Monthly summary reports and ROV with JA PRN. 

## 2015-08-01 ENCOUNTER — Ambulatory Visit (INDEPENDENT_AMBULATORY_CARE_PROVIDER_SITE_OTHER): Payer: BLUE CROSS/BLUE SHIELD | Admitting: *Deleted

## 2015-08-01 DIAGNOSIS — I639 Cerebral infarction, unspecified: Secondary | ICD-10-CM

## 2015-08-02 ENCOUNTER — Telehealth: Payer: Self-pay | Admitting: Neurology

## 2015-08-02 NOTE — Telephone Encounter (Signed)
Christian PootYesterday, Christian Mcintyre called me and asked if he could drop off an ICF for a research study that Dr. Pearlean BrownieSethi spoke to him about.  He dropped the ICF for the Respect-ESUS study.  Today, my manager let me know that Christian Mcintyre is out of the window for the study and that we cannot enroll him.  I called Christian Mcintyre and left a message letting him know that we appreciate his interests, however, Christian Mcintyre made several attempts to contact him and that unfortunately, he is out of the window for the study.  I left my direct telephone number if he had any questions.

## 2015-08-03 NOTE — Progress Notes (Signed)
[id number] 

## 2015-08-30 ENCOUNTER — Ambulatory Visit (INDEPENDENT_AMBULATORY_CARE_PROVIDER_SITE_OTHER): Payer: BLUE CROSS/BLUE SHIELD | Admitting: *Deleted

## 2015-08-30 DIAGNOSIS — I639 Cerebral infarction, unspecified: Secondary | ICD-10-CM

## 2015-08-30 NOTE — Progress Notes (Signed)
Carelink Summary Report / Loop Recorder 

## 2015-09-20 ENCOUNTER — Encounter: Payer: Self-pay | Admitting: Internal Medicine

## 2015-09-28 ENCOUNTER — Ambulatory Visit (INDEPENDENT_AMBULATORY_CARE_PROVIDER_SITE_OTHER): Payer: Self-pay | Admitting: *Deleted

## 2015-09-28 DIAGNOSIS — I639 Cerebral infarction, unspecified: Secondary | ICD-10-CM

## 2015-09-28 NOTE — Progress Notes (Signed)
Carelink Summary Report / Loop Recorder 

## 2015-10-03 ENCOUNTER — Telehealth: Payer: Self-pay | Admitting: *Deleted

## 2015-10-03 NOTE — Telephone Encounter (Signed)
Called patient regarding tachy episodes on Carelink transmissions from 09/10/15 and 09/17/15.  Patient states he was asymptomatic.  Also requested that he send a manual transmission for review.  Patient is agreeable and verbalizes understanding of instructions.  He denies additional questions at this time.

## 2015-10-04 LAB — CUP PACEART REMOTE DEVICE CHECK: Date Time Interrogation Session: 20161124172525

## 2015-10-08 LAB — CUP PACEART REMOTE DEVICE CHECK: Date Time Interrogation Session: 20161226113511

## 2015-10-20 ENCOUNTER — Ambulatory Visit (INDEPENDENT_AMBULATORY_CARE_PROVIDER_SITE_OTHER): Payer: Managed Care, Other (non HMO) | Admitting: Neurology

## 2015-10-20 ENCOUNTER — Encounter: Payer: Self-pay | Admitting: Neurology

## 2015-10-20 VITALS — BP 139/85 | HR 73 | Ht 74.0 in | Wt 252.0 lb

## 2015-10-20 DIAGNOSIS — I639 Cerebral infarction, unspecified: Secondary | ICD-10-CM | POA: Diagnosis not present

## 2015-10-20 NOTE — Progress Notes (Signed)
[facility] [address] [place]. [place] [address] [phone]       OFFICE FOLLOW-UP NOTE  Christian. Christian Mcintyre Date of Birth:  August 31, 1956 Medical Record Number:  [id number]   HPI: Christian Mcintyre is a 60 year old male seen today for first office follow-up visit following hospital admission for stroke in August 2016.Christian Mcintyre is a 60 y.o. male hx of HTN, tobacco use admitted with concern of dysarthria and word finding difficulties. Notes waking up at 0600 on 04/27/15 with slurred speech and difficulty getting his words out. Shortly after this resolved but then continued intermittently throughout the day. Denies any sensory or motor deficits. No visual changes. No headache. Denies any recent illnesses, no new medications. Normal routine this morning, didn't skip breakfast, normal night sleep. No prior CVA or TIA. CT head imaging reviewed and overall unremarkable.  Date last known well: 04/27/2015 Time last known well: 2200 tPA Given: no, outside IV tPA window Modified Rankin: Rankin Score=0  MRI scan of the brain which I personally reviewed showed several tiny scattered small frontal left MCA branch embolic infarcts. MRA of the brain was unremarkable except for mild atherosclerosis of the left carotid siphon. There are mild changes of nonspecific white matter small vessel disease. Transthoracic echo showed normal ejection fraction. Aortic valve is showed a calcified rounded density in the left ventricular outflow track and subsequently outpatient TEE was obtained on 05/16/15 which showed no evidence of aortic valve vegetation or clot. No PFO was found. Patient had loop recorder insertion done on 05/31/15 and so far no paroxysmal atrial fibrillation has not been found. He is tolerating aspirin well without significant bruising or bleeding and Lipitor without myalgias. He states his blood pressure is well controlled and today it is 142/90. He is changes diet and is exercising regularly but  has not yet lost weight. He plans to do so. Update 10/20/2015 : He returns for follow-up after last visit 3 months ago. He continues to do well without recurrent stroke or TIA symptoms. His starting aspirin well without significant bleeding or bruising. He states his blood pressure is well controlled and today it is 139/85. Is tolerating Lipitor well without myalgias or arthralgias. He has been using his CPAP regularly every night. He was called by the cardiology device clinic with an episode of tachycardia on 09/10/15-1/14 for 17 but patient was apparently asymptomatic. He wants to participate in the RESPECT ESUS trial but has not yet heard back from research team whether he qualifies ROS:   14 system review of systems is positive for runny nose, apnea joint pain only and all systems negative  PMH:  Past Medical History  Diagnosis Date  . Hypertension   . Sleep apnea     cpap  . Stroke [facility])     Social History:  Social History   Social History  . Marital Status: Married    Spouse Name: N/A  . Number of Children: N/A  . Years of Education: N/A   Occupational History  . Not on file.   Social History Main Topics  . Smoking status: Former Smoker    Quit date: 09/02/1998  . Smokeless tobacco: Not on file  . Alcohol Use: 0.6 oz/week    0 Standard drinks or equivalent, 1 Glasses of wine per week     Comment: daily wine  . Drug Use: No  . Sexual Activity: Not on file   Other Topics Concern  . Not on file   Social History Narrative  Medications:   Current Outpatient Prescriptions on File Prior to Visit  Medication Sig Dispense Refill  . aspirin EC 325 MG EC tablet Take 1 tablet (325 mg total) by mouth daily. 30 tablet 0  . atorvastatin (LIPITOR) 40 MG tablet Take 1 tablet (40 mg total) by mouth daily at 6 PM. 30 tablet 0  . valsartan-hydrochlorothiazide (DIOVAN-HCT) 320-12.5 MG per tablet Take 1 tablet by mouth daily.     No current facility-administered medications on file  prior to visit.    Allergies:  No Known Allergies  Physical Exam General: obese middle-age Caucasian male, seated, in no evident distress Head: head normocephalic and atraumatic.  Neck: supple with no carotid or supraclavicular bruits Cardiovascular: regular rate and rhythm, no murmurs Musculoskeletal: no deformity Skin:  no rash/petichiae Vascular:  Normal pulses all extremities Filed Vitals:   10/20/15 1641  BP: 139/85  Pulse: 73   Neurologic Exam Mental Status: Awake and fully alert. Oriented to place and time. Recent and remote memory intact. Attention span, concentration and fund of knowledge appropriate. Mood and affect appropriate.  Cranial Nerves: Fundoscopic exam not done Pupils equal, briskly reactive to light. Extraocular movements full without nystagmus. Visual fields full to confrontation. Hearing intact. Facial sensation intact. Face, tongue, palate moves normally and symmetrically.  Motor: Normal bulk and tone. Normal strength in all tested extremity muscles. Sensory.: intact to touch ,pinprick .position and vibratory sensation.  Coordination: Rapid alternating movements normal in all extremities. Finger-to-nose and heel-to-shin performed accurately bilaterally. Gait and Station: Arises from chair without difficulty. Stance is normal. Gait demonstrates normal stride length and balance . Able to heel, toe and tandem walk without difficulty.  Reflexes: 1+ and symmetric. Toes downgoing.   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 Vascular risk factors of hypertension and hyperlipidemia only.    PLAN: I had a long d/w patient about his remote stroke, risk for recurrent stroke/TIAs, personally independently reviewed imaging studies and stroke evaluation results and answered questions.Continue aspirin 325 mg daily  for secondary stroke prevention and maintain  strict control of hypertension with blood pressure goal below 130/90, diabetes with hemoglobin A1c goal below 6.5% and lipids with LDL cholesterol goal below 70 mg/dL. He was also advised to be compliant with wearing his CPAP mask every night. I also advised the patient to eat a healthy diet with plenty of whole grains, cereals, fruits and vegetables, exercise regularly and maintain ideal body weight greater than 50% time during this 25 minute visit was spent on counseling and coordination of care. Followup in the future with me in  1 year or call earlier if necessary   Delia Heady, MD Note: This document was prepared with digital dictation and possible smart phrase technology. Any transcriptional errors that result from this process are unintentional

## 2015-10-20 NOTE — Patient Instructions (Signed)
I had a long d/w patient about his remote stroke, risk for recurrent stroke/TIAs, personally independently reviewed imaging studies and stroke evaluation results and answered questions.Continue aspirin 325 mg daily  for secondary stroke prevention and maintain strict control of hypertension with blood pressure goal below 130/90, diabetes with hemoglobin A1c goal below 6.5% and lipids with LDL cholesterol goal below 70 mg/dL. He was also advised to be compliant with wearing his CPAP mask every night. I also advised the patient to eat a healthy diet with plenty of whole grains, cereals, fruits and vegetables, exercise regularly and maintain ideal body weight Followup in the future with me in  1 year or call earlier if necessary

## 2015-10-21 ENCOUNTER — Other Ambulatory Visit (HOSPITAL_BASED_OUTPATIENT_CLINIC_OR_DEPARTMENT_OTHER): Payer: Self-pay | Admitting: Neurosurgery

## 2015-10-21 ENCOUNTER — Ambulatory Visit (HOSPITAL_BASED_OUTPATIENT_CLINIC_OR_DEPARTMENT_OTHER)
Admission: RE | Admit: 2015-10-21 | Discharge: 2015-10-21 | Disposition: A | Discharge status: Home / Self Care | Payer: Managed Care, Other (non HMO) | Source: Ambulatory Visit | Attending: Neurosurgery | Admitting: Neurosurgery

## 2015-10-21 ENCOUNTER — Telehealth: Payer: Self-pay

## 2015-10-21 DIAGNOSIS — M5136 Other intervertebral disc degeneration, lumbar region: Secondary | ICD-10-CM | POA: Diagnosis present

## 2015-10-21 DIAGNOSIS — Z981 Arthrodesis status: Secondary | ICD-10-CM | POA: Insufficient documentation

## 2015-10-21 NOTE — Telephone Encounter (Signed)
I left a message for the patient to return my call.

## 2015-10-24 ENCOUNTER — Telehealth: Payer: Self-pay

## 2015-10-24 NOTE — Telephone Encounter (Signed)
I spoke to the patient in regards to the Respect-ESUS study. The patient acknowledged the message that I had left on [id number], and stated that he had not had time to call us back. I explained to the patient that he no longer qualifies for the study, as the time for him to be enrolled had passed. He expressed that he wished he had been called before, and I reminded him of our multiple phone call failed attempts between 10NOV2016 and 29NOV2016. I also explained to the patient that due to the Good Clinical Pratice (GCP) guidelines, patients who wished to participate in research must signed the Informed Consent Form (ICF) in front of an investigator or study coordinator. Therefore, the ICF that he dropped off at our site on 29NOV2016 was invalid. I thanked him for his time and consideration, and I told him that should we find any other studies where he is eligible, we would let him know. Patient voiced understanding.

## 2015-10-28 ENCOUNTER — Ambulatory Visit (INDEPENDENT_AMBULATORY_CARE_PROVIDER_SITE_OTHER): Payer: Managed Care, Other (non HMO) | Admitting: *Deleted

## 2015-10-28 DIAGNOSIS — I639 Cerebral infarction, unspecified: Secondary | ICD-10-CM | POA: Diagnosis not present

## 2015-10-28 NOTE — Progress Notes (Signed)
Carelink Summary Report / Loop Recorder 

## 2015-10-31 NOTE — Telephone Encounter (Signed)
Agree. thanks

## 2015-11-01 ENCOUNTER — Encounter: Payer: Self-pay | Admitting: Internal Medicine

## 2015-11-14 LAB — CUP PACEART REMOTE DEVICE CHECK: MDC IDC SESS DTM: 20170224120512

## 2015-11-14 NOTE — Progress Notes (Signed)
Carelink summary report received. Battery status OK. Normal device function. No new symptom episodes, tachy episodes, brady, or pause episodes. No new AF episodes. Monthly summary reports and ROV/PRN 

## 2015-11-19 LAB — CUP PACEART REMOTE DEVICE CHECK: Date Time Interrogation Session: 20170125113519

## 2015-11-19 NOTE — Progress Notes (Signed)
Carelink summary report received. Battery status OK. Normal device function. No new symptom episodes, tachy episodes, brady, or pause episodes. No new AF episodes. Tachy episodes previously addressed. Monthly summary reports and ROV/PRN

## 2015-11-28 ENCOUNTER — Ambulatory Visit (INDEPENDENT_AMBULATORY_CARE_PROVIDER_SITE_OTHER): Payer: Managed Care, Other (non HMO) | Admitting: *Deleted

## 2015-11-28 DIAGNOSIS — I639 Cerebral infarction, unspecified: Secondary | ICD-10-CM

## 2015-11-28 NOTE — Progress Notes (Signed)
Carelink Summary Report / Loop Recorder 

## 2015-12-27 ENCOUNTER — Ambulatory Visit (INDEPENDENT_AMBULATORY_CARE_PROVIDER_SITE_OTHER): Payer: Managed Care, Other (non HMO) | Admitting: *Deleted

## 2015-12-27 DIAGNOSIS — I639 Cerebral infarction, unspecified: Secondary | ICD-10-CM | POA: Diagnosis not present

## 2015-12-27 NOTE — Progress Notes (Signed)
Carelink Summary Report / Loop Recorder 

## 2016-01-26 ENCOUNTER — Ambulatory Visit (INDEPENDENT_AMBULATORY_CARE_PROVIDER_SITE_OTHER): Payer: Managed Care, Other (non HMO) | Admitting: *Deleted

## 2016-01-26 DIAGNOSIS — I639 Cerebral infarction, unspecified: Secondary | ICD-10-CM | POA: Diagnosis not present

## 2016-01-26 NOTE — Progress Notes (Signed)
Carelink Summary Report / Loop Recorder 

## 2016-01-28 LAB — CUP PACEART REMOTE DEVICE CHECK: MDC IDC SESS DTM: 20170326151644

## 2016-01-28 NOTE — Progress Notes (Signed)
Carelink summary report received. Battery status OK. Normal device function. No new symptom episodes, tachy episodes, brady, or pause episodes. No new AF episodes. Monthly summary reports and ROV/PRN 

## 2016-01-30 LAB — CUP PACEART REMOTE DEVICE CHECK: MDC IDC SESS DTM: 20170425123535

## 2016-01-30 NOTE — Progress Notes (Signed)
Carelink summary report received. Battery status OK. Normal device function. No new symptom episodes, tachy episodes, brady, or pause episodes. No new AF episodes. Monthly summary reports and ROV/PRN 

## 2016-02-02 ENCOUNTER — Other Ambulatory Visit (HOSPITAL_BASED_OUTPATIENT_CLINIC_OR_DEPARTMENT_OTHER): Payer: Self-pay | Admitting: Neurosurgery

## 2016-02-02 DIAGNOSIS — M5137 Other intervertebral disc degeneration, lumbosacral region: Secondary | ICD-10-CM

## 2016-02-06 ENCOUNTER — Encounter: Payer: Self-pay | Admitting: Internal Medicine

## 2016-02-24 ENCOUNTER — Ambulatory Visit (HOSPITAL_BASED_OUTPATIENT_CLINIC_OR_DEPARTMENT_OTHER): Payer: Managed Care, Other (non HMO)

## 2016-02-27 ENCOUNTER — Ambulatory Visit (INDEPENDENT_AMBULATORY_CARE_PROVIDER_SITE_OTHER): Payer: Managed Care, Other (non HMO) | Admitting: *Deleted

## 2016-02-27 DIAGNOSIS — I639 Cerebral infarction, unspecified: Secondary | ICD-10-CM

## 2016-02-27 NOTE — Progress Notes (Signed)
Carelink Summary Report / Loop Recorder 

## 2016-03-01 LAB — CUP PACEART REMOTE DEVICE CHECK: MDC IDC SESS DTM: 20170525123602

## 2016-03-16 LAB — CUP PACEART REMOTE DEVICE CHECK: MDC IDC SESS DTM: 20170624133556

## 2016-03-26 ENCOUNTER — Ambulatory Visit (INDEPENDENT_AMBULATORY_CARE_PROVIDER_SITE_OTHER): Payer: Managed Care, Other (non HMO) | Admitting: *Deleted

## 2016-03-26 DIAGNOSIS — I639 Cerebral infarction, unspecified: Secondary | ICD-10-CM | POA: Diagnosis not present

## 2016-03-26 NOTE — Progress Notes (Signed)
Carelink Summary Report / Loop Recorder 

## 2016-04-06 ENCOUNTER — Ambulatory Visit (HOSPITAL_BASED_OUTPATIENT_CLINIC_OR_DEPARTMENT_OTHER)
Admission: RE | Admit: 2016-04-06 | Discharge: 2016-04-06 | Disposition: A | Discharge status: Home / Self Care | Payer: Managed Care, Other (non HMO) | Source: Ambulatory Visit | Attending: Neurosurgery | Admitting: Neurosurgery

## 2016-04-06 DIAGNOSIS — M5137 Other intervertebral disc degeneration, lumbosacral region: Secondary | ICD-10-CM

## 2016-04-06 DIAGNOSIS — Z9889 Other specified postprocedural states: Secondary | ICD-10-CM | POA: Insufficient documentation

## 2016-04-21 LAB — CUP PACEART REMOTE DEVICE CHECK: Date Time Interrogation Session: 20170724140528

## 2016-04-21 NOTE — Progress Notes (Signed)
Carelink summary report received. Battery status OK. Normal device function. No new symptom episodes, tachy episodes, brady, or pause episodes. No new AF episodes. Monthly summary reports and ROV/PRN 

## 2016-04-25 ENCOUNTER — Ambulatory Visit (INDEPENDENT_AMBULATORY_CARE_PROVIDER_SITE_OTHER): Payer: Managed Care, Other (non HMO) | Admitting: *Deleted

## 2016-04-25 DIAGNOSIS — I639 Cerebral infarction, unspecified: Secondary | ICD-10-CM

## 2016-04-25 NOTE — Progress Notes (Signed)
Carelink Summary Report / Loop Recorder 

## 2016-05-24 LAB — CUP PACEART REMOTE DEVICE CHECK: Date Time Interrogation Session: 20170823140744

## 2016-05-25 ENCOUNTER — Ambulatory Visit (INDEPENDENT_AMBULATORY_CARE_PROVIDER_SITE_OTHER): Payer: Managed Care, Other (non HMO) | Admitting: *Deleted

## 2016-05-25 DIAGNOSIS — I639 Cerebral infarction, unspecified: Secondary | ICD-10-CM

## 2016-05-25 NOTE — Progress Notes (Signed)
Carelink Summary Report / Loop Recorder 

## 2016-06-25 ENCOUNTER — Ambulatory Visit (INDEPENDENT_AMBULATORY_CARE_PROVIDER_SITE_OTHER): Payer: Managed Care, Other (non HMO) | Admitting: *Deleted

## 2016-06-25 DIAGNOSIS — I639 Cerebral infarction, unspecified: Secondary | ICD-10-CM | POA: Diagnosis not present

## 2016-06-25 NOTE — Progress Notes (Signed)
Carelink Summary Report / Loop Recorder 

## 2016-06-29 LAB — CUP PACEART REMOTE DEVICE CHECK: Date Time Interrogation Session: 20170922143731

## 2016-06-29 NOTE — Progress Notes (Signed)
Carelink summary report received. Battery status OK. Normal device function. No new symptom episodes, tachy episodes, brady, or pause episodes. No new AF episodes. Monthly summary reports and ROV/PRN 

## 2016-07-11 IMAGING — MR MR MRA HEAD W/O CM
9 of 11 series · 30 of 48 positions shown · non-contrast
Comparison: Head CT without contrast 04/28/2015.

CLINICAL DATA: 59-year-old male with acute onset slurred speech and
dysarthria upon waking. Initial encounter.

EXAM:
MRI HEAD WITHOUT CONTRAST
MRA HEAD WITHOUT CONTRAST
TECHNIQUE: Multiplanar, multiecho pulse sequences of the brain and surrounding
structures were obtained without intravenous contrast. Angiographic
images of the head were obtained using MRA technique without
contrast.

[Series 3: DWI · axial · 3.0mm · 1.09mm/px · z∈[-103,+26]mm · 7 of 88 slices shown (1 of 4)]
[im 1/88]
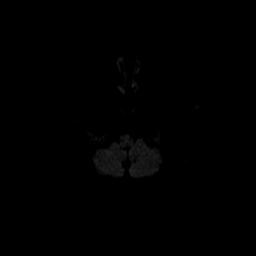
[im 15/88]
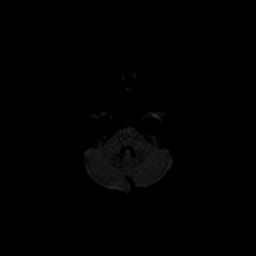
[im 30/88]
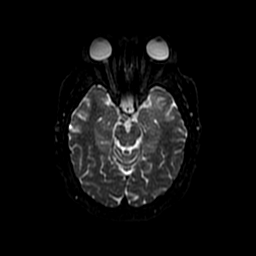
[im 44/88]
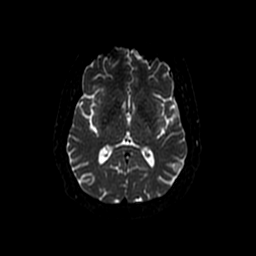
[im 59/88]
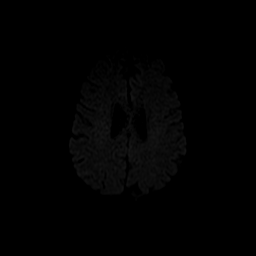
[im 73/88]
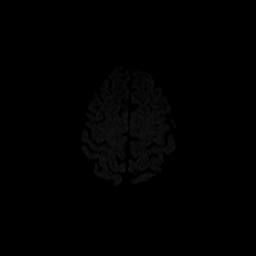
[im 88/88]
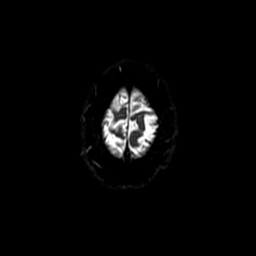

[Series 4: T1 · sagittal · 5.0mm · 0.47mm/px · 2 of 24 slices shown]
[im 1/24]
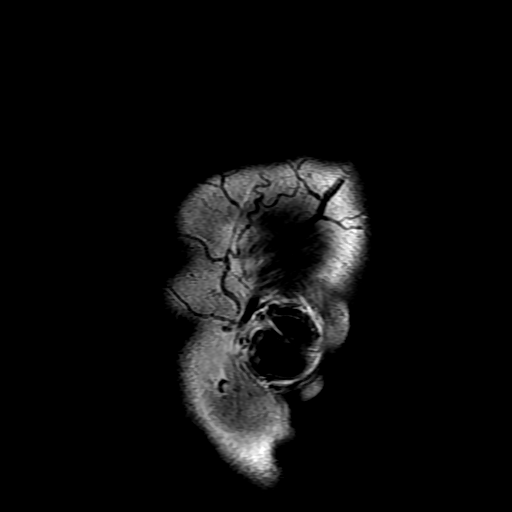
[im 24/24]
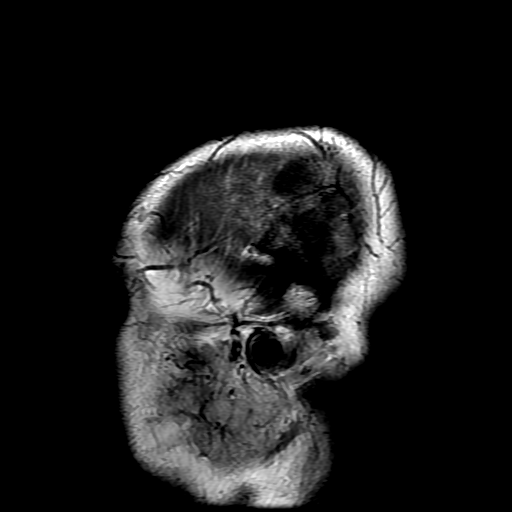

[Series 5: DWI · coronal · 5.0mm · 1.09mm/px · 5 of 66 slices shown (2 of 4)]
[im 1/66]
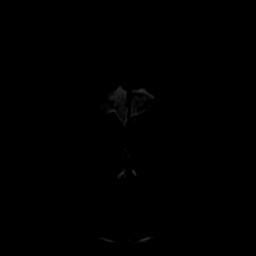
[im 17/66]
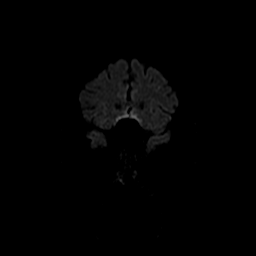
[im 33/66]
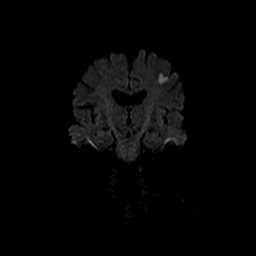
[im 49/66]
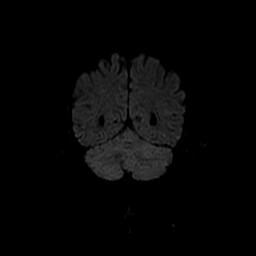
[im 66/66]
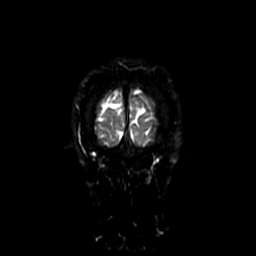

[Series 6: (id) mt fs · axial · 1.4mm · 0.43mm/px · z∈[-116,-68]mm · 4 of 154 slices shown]
[im 1/154]
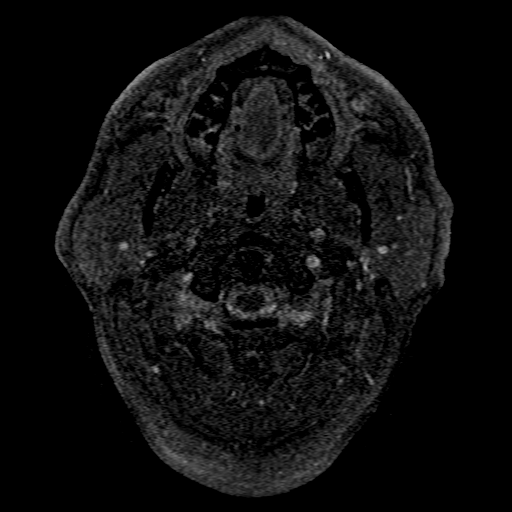
[im 28/154]
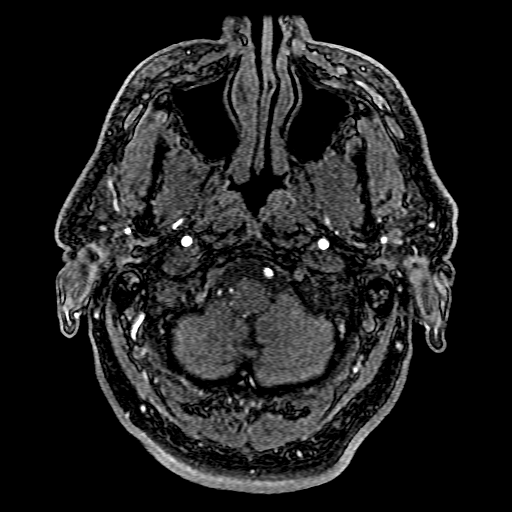
[im 42/154]
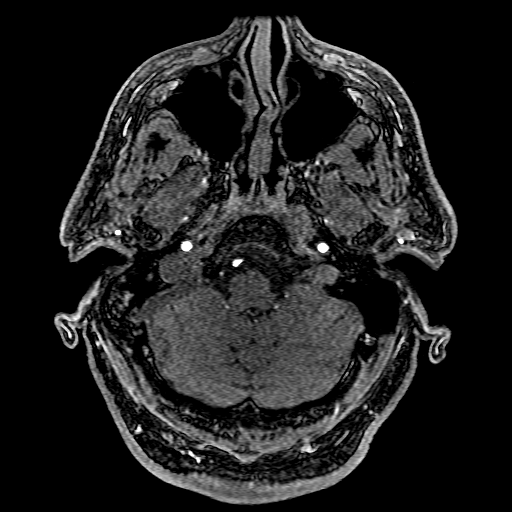
[im 70/154]
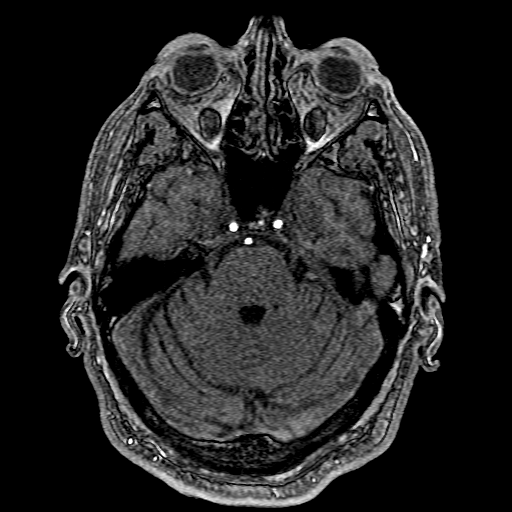

[Series 7: T2 · axial · 5.0mm · 0.47mm/px · z∈[-119,+36]mm · 2 of 27 slices shown (1 of 2)]
[im 1/27]
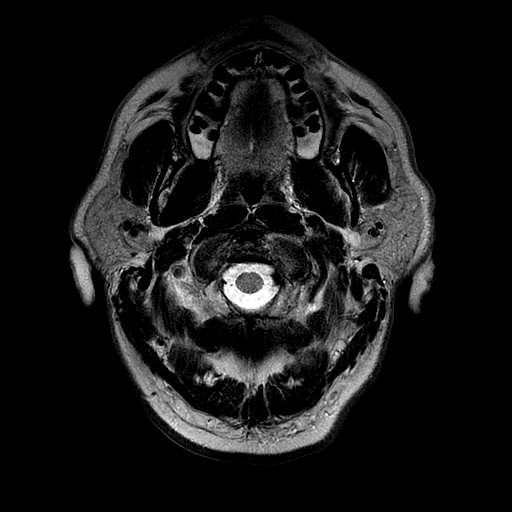
[im 27/27]
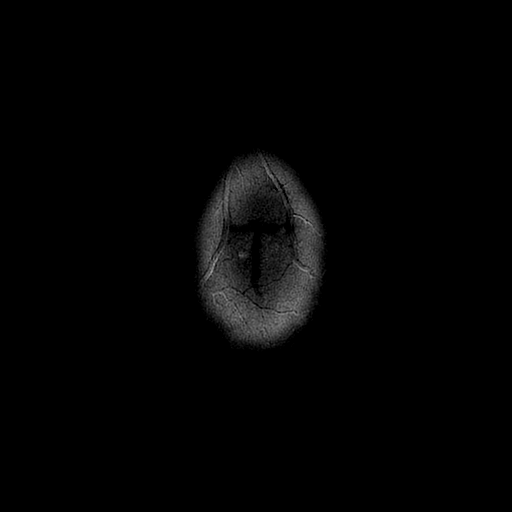

[Series 8: FLAIR · axial · 5.0mm · 0.47mm/px · z∈[-119,+36]mm · 2 of 27 slices shown]
[im 1/27]
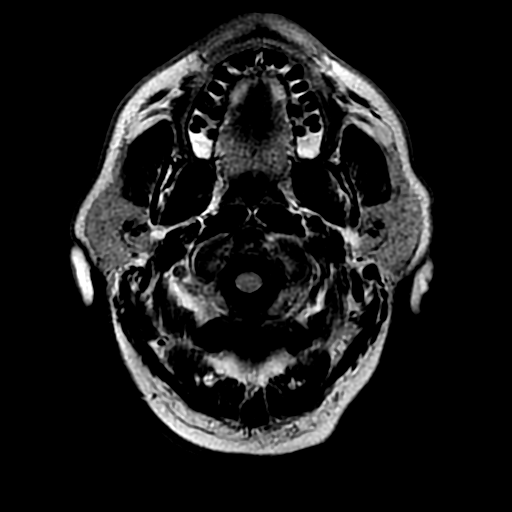
[im 27/27]
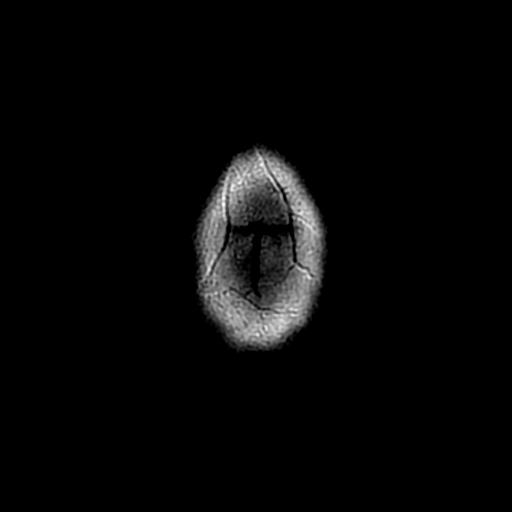

[Series 11: T2 · coronal · 5.0mm · 0.43mm/px · 2 of 31 slices shown (2 of 2)]
[im 1/31]
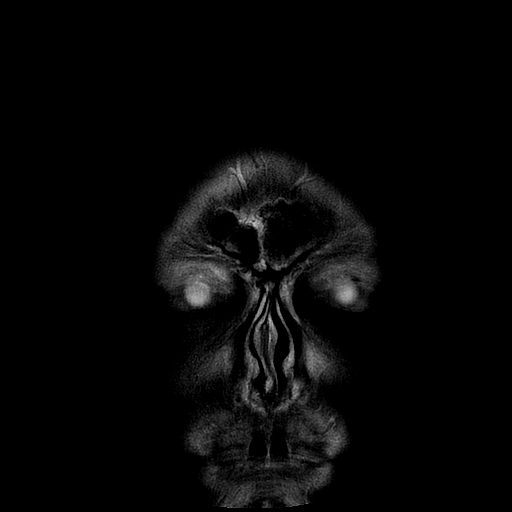
[im 31/31]
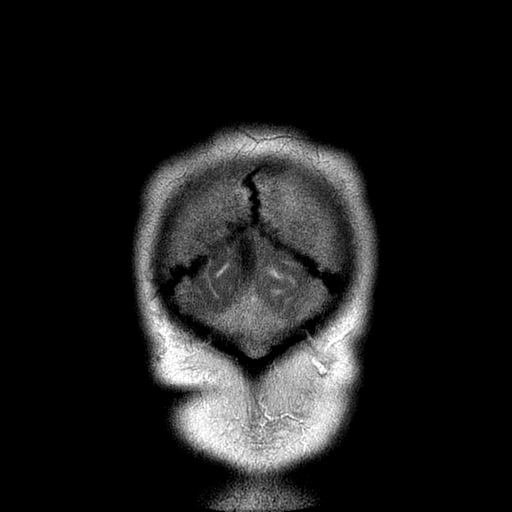

[Series 300: DWI · axial · 3.0mm · 1.09mm/px · z∈[-103,+26]mm · 3 of 44 slices shown (3 of 4)]
[im 1/44]
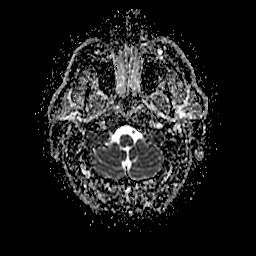
[im 22/44]
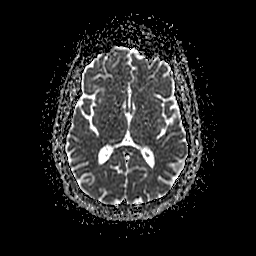
[im 44/44]
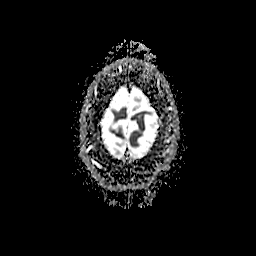

[Series 500: DWI · coronal · 5.0mm · 1.09mm/px · 3 of 33 slices shown (4 of 4)]
[im 1/33]
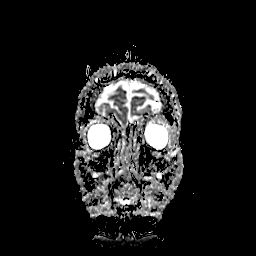
[im 17/33]
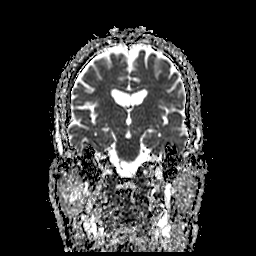
[im 33/33]
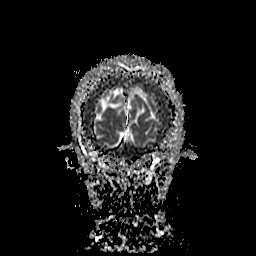

[30 of 48 positions shown; findings below may reference images not displayed]

FINDINGS: MRI HEAD FINDINGS

Cerebral volume is within normal limits for age. Several scattered,
small (up to 15 mm) foci of restricted diffusion in the left MCA
territory affecting the inferior aspect of the superior frontal
gyrus/middle frontal gyrus in the pre motor area. Mostly subcortical
white matter is affected. Mild associated T2 and FLAIR
hyperintensity. No associated hemorrhage or mass effect.

No contralateral or posterior fossa restricted diffusion. Major
intracranial vascular flow voids are within normal limits, dominant
distal left vertebral artery.

Scattered small bilateral cerebral white matter mostly subcortical
foci of nonspecific T2 and FLAIR hyperintensity. No cortical
encephalomalacia or chronic cerebral blood products. Deep gray
matter nuclei, brainstem, and cerebellum are within normal limits.

Visible internal auditory structures appear normal. Mild mastoid
effusions, more so on the right. Trace retained secretions in the
nasopharynx. Negative paranasal sinuses, orbits soft tissues, scalp
soft tissues, and visualized cervical spine. Normal bone marrow
signal.

MRA HEAD FINDINGS

Antegrade flow in the posterior circulation. The non dominant distal
right vertebral artery functionally terminates in PICA, with only a
diminutive contribution to the basilar. Normal left PICA origin. No
basilar artery stenosis. Normal SCA and PCA origins. Posterior
communicating arteries are diminutive or absent. Bilateral PCA
branches are within normal limits.

Antegrade flow in both ICA siphons. There is mild to moderate siphon
irregularity greater on the left, without hemodynamically
significant siphon stenosis. Normal ophthalmic artery origins.
Patent carotid termini. Normal MCA and ACA origins.

Anterior communicating artery and visualized bilateral ACA branches
are within normal limits. Right MCA M1 segment and visualized
branches are within normal limits. Left MCA M1 segment and
bifurcation are patent. No left MCA branch occlusion is identified.
IMPRESSION: 1. Several scattered, small acute left MCA territory infarcts in the
premotor area. No mass effect or hemorrhage.
2. Left ICA siphon atherosclerosis but no hemodynamically
significant stenosis or left MCA branch occlusion identified on
intracranial MRA.
3. Underlying mild to moderate for age nonspecific subcortical white
matter signal changes.
4. Mild mastoid effusions.

## 2016-07-21 LAB — CUP PACEART REMOTE DEVICE CHECK
Date Time Interrogation Session: 20171022144223
Implantable Pulse Generator Implant Date: 20160927

## 2016-07-21 NOTE — Progress Notes (Signed)
Carelink summary report received. Battery status OK. Normal device function. No new symptom episodes, tachy episodes, brady, or pause episodes. No new AF episodes. Monthly summary reports and ROV/PRN 

## 2016-07-24 ENCOUNTER — Ambulatory Visit (INDEPENDENT_AMBULATORY_CARE_PROVIDER_SITE_OTHER): Payer: Managed Care, Other (non HMO) | Admitting: *Deleted

## 2016-07-24 DIAGNOSIS — I639 Cerebral infarction, unspecified: Secondary | ICD-10-CM | POA: Diagnosis not present

## 2016-07-24 NOTE — Progress Notes (Signed)
Carelink Summary Report / Loop Recorder 

## 2016-08-23 ENCOUNTER — Ambulatory Visit (INDEPENDENT_AMBULATORY_CARE_PROVIDER_SITE_OTHER): Payer: Managed Care, Other (non HMO) | Admitting: *Deleted

## 2016-08-23 DIAGNOSIS — I639 Cerebral infarction, unspecified: Secondary | ICD-10-CM

## 2016-08-23 NOTE — Progress Notes (Signed)
Carelink Summary Report / Loop Recorder 

## 2016-09-06 LAB — CUP PACEART REMOTE DEVICE CHECK
Implantable Pulse Generator Implant Date: 20160927
MDC IDC SESS DTM: 20171121153705

## 2016-09-06 NOTE — Progress Notes (Signed)
Carelink summary report received. Battery status OK. Normal device function. No new symptom episodes, tachy episodes, brady, or pause episodes. No new AF episodes. Monthly summary reports and ROV/PRN 

## 2016-09-24 ENCOUNTER — Ambulatory Visit (INDEPENDENT_AMBULATORY_CARE_PROVIDER_SITE_OTHER): Payer: Managed Care, Other (non HMO) | Admitting: *Deleted

## 2016-09-24 DIAGNOSIS — I639 Cerebral infarction, unspecified: Secondary | ICD-10-CM | POA: Diagnosis not present

## 2016-09-24 NOTE — Progress Notes (Signed)
Carelink Summary Report / Loop Recorder 

## 2016-10-10 LAB — CUP PACEART REMOTE DEVICE CHECK
Date Time Interrogation Session: 20171221154019
Implantable Pulse Generator Implant Date: 20160927

## 2016-10-22 ENCOUNTER — Ambulatory Visit (INDEPENDENT_AMBULATORY_CARE_PROVIDER_SITE_OTHER): Payer: Managed Care, Other (non HMO) | Admitting: *Deleted

## 2016-10-22 DIAGNOSIS — I639 Cerebral infarction, unspecified: Secondary | ICD-10-CM | POA: Diagnosis not present

## 2016-10-23 NOTE — Progress Notes (Signed)
Carelink Summary Report / Loop Recorder 

## 2016-10-27 LAB — CUP PACEART REMOTE DEVICE CHECK
Date Time Interrogation Session: 20180120164441
MDC IDC PG IMPLANT DT: 20160927

## 2016-11-12 LAB — CUP PACEART REMOTE DEVICE CHECK
Implantable Pulse Generator Implant Date: 20160927
MDC IDC SESS DTM: 20180219163807

## 2016-11-21 ENCOUNTER — Ambulatory Visit (INDEPENDENT_AMBULATORY_CARE_PROVIDER_SITE_OTHER): Payer: Managed Care, Other (non HMO) | Admitting: *Deleted

## 2016-11-21 DIAGNOSIS — I639 Cerebral infarction, unspecified: Secondary | ICD-10-CM

## 2016-11-21 NOTE — Progress Notes (Signed)
Carelink Summary Report / Loop Recorder 

## 2016-12-03 LAB — CUP PACEART REMOTE DEVICE CHECK
Date Time Interrogation Session: 20180321163940
MDC IDC PG IMPLANT DT: 20160927

## 2016-12-21 ENCOUNTER — Ambulatory Visit (INDEPENDENT_AMBULATORY_CARE_PROVIDER_SITE_OTHER): Payer: Managed Care, Other (non HMO) | Admitting: *Deleted

## 2016-12-21 DIAGNOSIS — I639 Cerebral infarction, unspecified: Secondary | ICD-10-CM | POA: Diagnosis not present

## 2016-12-24 NOTE — Progress Notes (Signed)
Carelink Summary Report / Loop Recorder 

## 2016-12-28 LAB — CUP PACEART REMOTE DEVICE CHECK
Implantable Pulse Generator Implant Date: 20160927
MDC IDC SESS DTM: 20180420164400

## 2017-01-21 ENCOUNTER — Ambulatory Visit (INDEPENDENT_AMBULATORY_CARE_PROVIDER_SITE_OTHER): Payer: Managed Care, Other (non HMO) | Admitting: *Deleted

## 2017-01-21 DIAGNOSIS — I639 Cerebral infarction, unspecified: Secondary | ICD-10-CM

## 2017-01-21 NOTE — Progress Notes (Signed)
Carelink Summary Report / Loop Recorder 

## 2017-01-24 LAB — CUP PACEART REMOTE DEVICE CHECK
Date Time Interrogation Session: 20180520173626
MDC IDC PG IMPLANT DT: 20160927

## 2017-02-19 ENCOUNTER — Ambulatory Visit (INDEPENDENT_AMBULATORY_CARE_PROVIDER_SITE_OTHER): Payer: Managed Care, Other (non HMO) | Admitting: *Deleted

## 2017-02-19 DIAGNOSIS — I639 Cerebral infarction, unspecified: Secondary | ICD-10-CM

## 2017-02-21 NOTE — Progress Notes (Signed)
Carelink Summary Report / Loop Recorder 

## 2017-03-13 ENCOUNTER — Telehealth: Payer: Self-pay | Admitting: *Deleted

## 2017-03-13 NOTE — Telephone Encounter (Signed)
LMOVM requesting manual transmission from patient's home monitor.  Gave Device Clinic phone number for questions/concerns.

## 2017-03-15 NOTE — Telephone Encounter (Signed)
Manual transmission received today.  Tachy episode from 02/19/17 printed for review.  ECG appears ST, reviewed with Dr. Johney FrameAllred, no changes at this time.  Will continue to monitor remotely.

## 2017-03-20 ENCOUNTER — Other Ambulatory Visit: Payer: Self-pay | Admitting: Internal Medicine

## 2017-03-21 ENCOUNTER — Ambulatory Visit (INDEPENDENT_AMBULATORY_CARE_PROVIDER_SITE_OTHER): Payer: Managed Care, Other (non HMO) | Admitting: *Deleted

## 2017-03-21 DIAGNOSIS — I639 Cerebral infarction, unspecified: Secondary | ICD-10-CM

## 2017-03-26 NOTE — Progress Notes (Signed)
Carelink Summary Report / Loop Recorder 

## 2017-04-05 LAB — CUP PACEART REMOTE DEVICE CHECK
Date Time Interrogation Session: 20180720033913
Implantable Pulse Generator Implant Date: 20160927

## 2017-04-22 ENCOUNTER — Ambulatory Visit (INDEPENDENT_AMBULATORY_CARE_PROVIDER_SITE_OTHER): Payer: Managed Care, Other (non HMO) | Admitting: *Deleted

## 2017-04-22 DIAGNOSIS — I639 Cerebral infarction, unspecified: Secondary | ICD-10-CM

## 2017-04-23 NOTE — Progress Notes (Signed)
Carelink Summary Report / Loop Recorder 

## 2017-04-24 ENCOUNTER — Telehealth: Payer: Self-pay | Admitting: Cardiology

## 2017-04-24 NOTE — Telephone Encounter (Signed)
LMOVM requesting that pt send manual transmission b/c home monitor has not updated in at least 14 days.    

## 2017-04-26 LAB — CUP PACEART REMOTE DEVICE CHECK
Date Time Interrogation Session: 20180819100918
Implantable Pulse Generator Implant Date: 20160927

## 2017-05-21 ENCOUNTER — Ambulatory Visit (INDEPENDENT_AMBULATORY_CARE_PROVIDER_SITE_OTHER): Payer: Managed Care, Other (non HMO) | Admitting: *Deleted

## 2017-05-21 DIAGNOSIS — I639 Cerebral infarction, unspecified: Secondary | ICD-10-CM

## 2017-05-21 NOTE — Progress Notes (Signed)
Carelink Summary Report / Loop Recorder 

## 2017-05-23 LAB — CUP PACEART REMOTE DEVICE CHECK
Implantable Pulse Generator Implant Date: 20160927
MDC IDC SESS DTM: 20180918104100

## 2017-06-20 ENCOUNTER — Ambulatory Visit (INDEPENDENT_AMBULATORY_CARE_PROVIDER_SITE_OTHER): Payer: Managed Care, Other (non HMO) | Admitting: *Deleted

## 2017-06-20 DIAGNOSIS — I639 Cerebral infarction, unspecified: Secondary | ICD-10-CM | POA: Diagnosis not present

## 2017-06-20 NOTE — Progress Notes (Signed)
Carelink Summary Report / loop Recorder  

## 2017-06-21 LAB — CUP PACEART REMOTE DEVICE CHECK
Date Time Interrogation Session: 20181018114001
Implantable Pulse Generator Implant Date: 20160927

## 2017-07-10 ENCOUNTER — Other Ambulatory Visit: Payer: Self-pay | Admitting: Internal Medicine

## 2017-07-22 ENCOUNTER — Ambulatory Visit (INDEPENDENT_AMBULATORY_CARE_PROVIDER_SITE_OTHER): Payer: Managed Care, Other (non HMO) | Admitting: *Deleted

## 2017-07-22 DIAGNOSIS — I639 Cerebral infarction, unspecified: Secondary | ICD-10-CM | POA: Diagnosis not present

## 2017-07-22 NOTE — Progress Notes (Signed)
Carelink Summary Report / Loop Recorder 

## 2017-08-08 LAB — CUP PACEART REMOTE DEVICE CHECK
Date Time Interrogation Session: 20181117114053
MDC IDC PG IMPLANT DT: 20160927

## 2017-08-19 ENCOUNTER — Ambulatory Visit (INDEPENDENT_AMBULATORY_CARE_PROVIDER_SITE_OTHER): Payer: Managed Care, Other (non HMO) | Admitting: *Deleted

## 2017-08-19 DIAGNOSIS — I639 Cerebral infarction, unspecified: Secondary | ICD-10-CM | POA: Diagnosis not present

## 2017-08-19 NOTE — Progress Notes (Signed)
Carelink Summary Report / Loop Recorder 

## 2017-08-29 LAB — CUP PACEART REMOTE DEVICE CHECK
MDC IDC PG IMPLANT DT: 20160927
MDC IDC SESS DTM: 20181217124002

## 2017-09-18 ENCOUNTER — Ambulatory Visit (INDEPENDENT_AMBULATORY_CARE_PROVIDER_SITE_OTHER): Payer: Managed Care, Other (non HMO) | Admitting: *Deleted

## 2017-09-18 DIAGNOSIS — I639 Cerebral infarction, unspecified: Secondary | ICD-10-CM | POA: Diagnosis not present

## 2017-09-19 NOTE — Progress Notes (Signed)
Carelink Summary Report / Loop Recorder 

## 2017-09-27 LAB — CUP PACEART REMOTE DEVICE CHECK
Date Time Interrogation Session: 20190116131209
MDC IDC PG IMPLANT DT: 20160927

## 2017-10-18 ENCOUNTER — Ambulatory Visit (INDEPENDENT_AMBULATORY_CARE_PROVIDER_SITE_OTHER): Payer: Managed Care, Other (non HMO) | Admitting: *Deleted

## 2017-10-18 DIAGNOSIS — I639 Cerebral infarction, unspecified: Secondary | ICD-10-CM | POA: Diagnosis not present

## 2017-10-21 NOTE — Progress Notes (Signed)
Carelink Summary Report / Loop Recorder 

## 2017-10-30 ENCOUNTER — Telehealth: Payer: Self-pay | Admitting: Cardiology

## 2017-10-30 NOTE — Telephone Encounter (Signed)
LMOVM requesting that pt send manual transmission b/c home monitor has not updated in at least 14 days.    

## 2017-11-18 LAB — CUP PACEART REMOTE DEVICE CHECK
Date Time Interrogation Session: 20190215133646
MDC IDC PG IMPLANT DT: 20160927

## 2017-11-20 ENCOUNTER — Ambulatory Visit: Payer: Managed Care, Other (non HMO) | Admitting: *Deleted

## 2017-11-21 NOTE — Progress Notes (Signed)
Not received  

## 2017-11-26 ENCOUNTER — Encounter: Payer: Self-pay | Admitting: Cardiology

## 2018-02-11 ENCOUNTER — Other Ambulatory Visit: Payer: Self-pay

## 2018-02-11 ENCOUNTER — Other Ambulatory Visit: Payer: Self-pay | Admitting: Neurosurgery

## 2018-02-11 ENCOUNTER — Encounter (HOSPITAL_COMMUNITY): Payer: Self-pay | Admitting: *Deleted

## 2018-02-12 ENCOUNTER — Inpatient Hospital Stay (HOSPITAL_COMMUNITY): Payer: Managed Care, Other (non HMO)

## 2018-02-12 ENCOUNTER — Inpatient Hospital Stay (HOSPITAL_COMMUNITY)
Admission: RE | Admit: 2018-02-12 | Discharge: 2018-02-14 | DRG: 455 | Disposition: A | Discharge status: Home / Self Care | Payer: Managed Care, Other (non HMO) | Source: Ambulatory Visit | Attending: Neurosurgery | Admitting: Neurosurgery

## 2018-02-12 ENCOUNTER — Encounter (HOSPITAL_COMMUNITY): Payer: Self-pay | Admitting: Surgery

## 2018-02-12 ENCOUNTER — Encounter (HOSPITAL_COMMUNITY)
Admission: RE | Disposition: A | Discharge status: Home / Self Care | Payer: Self-pay | Source: Ambulatory Visit | Attending: Neurosurgery

## 2018-02-12 DIAGNOSIS — Z8249 Family history of ischemic heart disease and other diseases of the circulatory system: Secondary | ICD-10-CM

## 2018-02-12 DIAGNOSIS — Z8673 Personal history of transient ischemic attack (TIA), and cerebral infarction without residual deficits: Secondary | ICD-10-CM | POA: Diagnosis not present

## 2018-02-12 DIAGNOSIS — R339 Retention of urine, unspecified: Secondary | ICD-10-CM | POA: Diagnosis present

## 2018-02-12 DIAGNOSIS — I10 Essential (primary) hypertension: Secondary | ICD-10-CM | POA: Diagnosis present

## 2018-02-12 DIAGNOSIS — N35919 Unspecified urethral stricture, male, unspecified site: Secondary | ICD-10-CM | POA: Diagnosis present

## 2018-02-12 DIAGNOSIS — Z79899 Other long term (current) drug therapy: Secondary | ICD-10-CM

## 2018-02-12 DIAGNOSIS — Z87891 Personal history of nicotine dependence: Secondary | ICD-10-CM

## 2018-02-12 DIAGNOSIS — G4730 Sleep apnea, unspecified: Secondary | ICD-10-CM | POA: Diagnosis present

## 2018-02-12 DIAGNOSIS — Z981 Arthrodesis status: Secondary | ICD-10-CM | POA: Diagnosis not present

## 2018-02-12 DIAGNOSIS — Z7982 Long term (current) use of aspirin: Secondary | ICD-10-CM | POA: Diagnosis not present

## 2018-02-12 DIAGNOSIS — Z419 Encounter for procedure for purposes other than remedying health state, unspecified: Secondary | ICD-10-CM

## 2018-02-12 DIAGNOSIS — Z95818 Presence of other cardiac implants and grafts: Secondary | ICD-10-CM

## 2018-02-12 DIAGNOSIS — M5126 Other intervertebral disc displacement, lumbar region: Principal | ICD-10-CM | POA: Diagnosis present

## 2018-02-12 LAB — CBC
HEMATOCRIT: 42.4 % (ref 39.0–52.0)
Hemoglobin: 14 g/dL (ref 13.0–17.0)
MCH: 32.1 pg (ref 26.0–34.0)
MCHC: 33 g/dL (ref 30.0–36.0)
MCV: 97.2 fL (ref 78.0–100.0)
Platelets: 206 10*3/uL (ref 150–400)
RBC: 4.36 MIL/uL (ref 4.22–5.81)
RDW: 12.4 % (ref 11.5–15.5)
WBC: 4.1 10*3/uL (ref 4.0–10.5)

## 2018-02-12 LAB — BASIC METABOLIC PANEL
Anion gap: 12 (ref 5–15)
BUN: 9 mg/dL (ref 6–20)
CO2: 24 mmol/L (ref 22–32)
Calcium: 9.3 mg/dL (ref 8.9–10.3)
Chloride: 105 mmol/L (ref 101–111)
Creatinine, Ser: 0.89 mg/dL (ref 0.61–1.24)
GFR calc Af Amer: >60 mL/min (ref >60)
GLUCOSE: 85 mg/dL (ref 65–99)
POTASSIUM: 3.9 mmol/L (ref 3.5–5.1)
Sodium: 141 mmol/L (ref 135–145)

## 2018-02-12 LAB — TYPE AND SCREEN
ABO/RH(D): A POS
Antibody Screen: NEGATIVE

## 2018-02-12 SURGERY — POSTERIOR LUMBAR FUSION 1 LEVEL
Anesthesia: General | Site: Back

## 2018-02-12 MED ORDER — CEFAZOLIN SODIUM-DEXTROSE 2-4 GM/100ML-% IV SOLN
2.0000 g | INTRAVENOUS | Status: AC
  Administered 2018-02-12 (×2): 2 g via INTRAVENOUS

## 2018-02-12 MED ORDER — CHLORHEXIDINE GLUCONATE CLOTH 2 % EX PADS: 6.0000 | MEDICATED_PAD | Freq: Once | CUTANEOUS | Status: DC

## 2018-02-12 MED ORDER — VANCOMYCIN HCL 1 G IV SOLR
INTRAVENOUS | Status: DC | PRN
  Administered 2018-02-12: 1000 mg via TOPICAL

## 2018-02-12 MED ORDER — SODIUM CHLORIDE 0.9 % IV SOLN
INTRAVENOUS | Status: DC | PRN
  Administered 2018-02-12: 500 mL

## 2018-02-12 MED ORDER — ROCURONIUM BROMIDE 10 MG/ML (PF) SYRINGE
PREFILLED_SYRINGE | INTRAVENOUS | Status: AC
  Filled 2018-02-12: qty 10

## 2018-02-12 MED ORDER — HYDROMORPHONE HCL 2 MG/ML IJ SOLN: 0.3000 mg | INTRAMUSCULAR | Status: DC | PRN

## 2018-02-12 MED ORDER — LIDOCAINE-EPINEPHRINE 1 %-1:100000 IJ SOLN
INTRAMUSCULAR | Status: DC | PRN
  Administered 2018-02-12: 10 mL via INTRADERMAL

## 2018-02-12 MED ORDER — SUCCINYLCHOLINE CHLORIDE 200 MG/10ML IV SOSY
PREFILLED_SYRINGE | INTRAVENOUS | Status: AC
  Filled 2018-02-12: qty 10

## 2018-02-12 MED ORDER — LACTATED RINGERS IV SOLN: INTRAVENOUS | Status: DC

## 2018-02-12 MED ORDER — MIDAZOLAM HCL 2 MG/2ML IJ SOLN
INTRAMUSCULAR | Status: AC
  Filled 2018-02-12: qty 2

## 2018-02-12 MED ORDER — SUGAMMADEX SODIUM 200 MG/2ML IV SOLN
INTRAVENOUS | Status: DC | PRN
  Administered 2018-02-12: 300 mg via INTRAVENOUS

## 2018-02-12 MED ORDER — 0.9 % SODIUM CHLORIDE (POUR BTL) OPTIME
TOPICAL | Status: DC | PRN
  Administered 2018-02-12: 1000 mL

## 2018-02-12 MED ORDER — ONDANSETRON HCL 4 MG/2ML IJ SOLN
INTRAMUSCULAR | Status: DC | PRN
  Administered 2018-02-12: 4 mg via INTRAVENOUS

## 2018-02-12 MED ORDER — DEXAMETHASONE SODIUM PHOSPHATE 10 MG/ML IJ SOLN
INTRAMUSCULAR | Status: DC | PRN
  Administered 2018-02-12: 10 mg via INTRAVENOUS

## 2018-02-12 MED ORDER — LIDOCAINE 2% (20 MG/ML) 5 ML SYRINGE
INTRAMUSCULAR | Status: AC
  Filled 2018-02-12: qty 5

## 2018-02-12 MED ORDER — FENTANYL CITRATE (PF) 250 MCG/5ML IJ SOLN
INTRAMUSCULAR | Status: DC | PRN
  Administered 2018-02-12 (×3): 100 ug via INTRAVENOUS
  Administered 2018-02-12 (×2): 50 ug via INTRAVENOUS
  Administered 2018-02-12: 100 ug via INTRAVENOUS

## 2018-02-12 MED ORDER — PROPOFOL 10 MG/ML IV BOLUS
INTRAVENOUS | Status: DC | PRN
  Administered 2018-02-12: 50 mg via INTRAVENOUS
  Administered 2018-02-12: 150 mg via INTRAVENOUS

## 2018-02-12 MED ORDER — MIDAZOLAM HCL 2 MG/2ML IJ SOLN
INTRAMUSCULAR | Status: DC | PRN
  Administered 2018-02-12: 2 mg via INTRAVENOUS

## 2018-02-12 MED ORDER — LIDOCAINE-EPINEPHRINE 1 %-1:100000 IJ SOLN
INTRAMUSCULAR | Status: AC
  Filled 2018-02-12: qty 1

## 2018-02-12 MED ORDER — OXYCODONE HCL 5 MG/5ML PO SOLN: 5.0000 mg | Freq: Once | ORAL | Status: DC | PRN

## 2018-02-12 MED ORDER — ROCURONIUM BROMIDE 10 MG/ML (PF) SYRINGE
PREFILLED_SYRINGE | INTRAVENOUS | Status: DC | PRN
  Administered 2018-02-12: 10 mg via INTRAVENOUS
  Administered 2018-02-12 (×3): 50 mg via INTRAVENOUS
  Administered 2018-02-12: 20 mg via INTRAVENOUS
  Administered 2018-02-12: 50 mg via INTRAVENOUS

## 2018-02-12 MED ORDER — FENTANYL CITRATE (PF) 250 MCG/5ML IJ SOLN
INTRAMUSCULAR | Status: AC
  Filled 2018-02-12: qty 5

## 2018-02-12 MED ORDER — PROMETHAZINE HCL 25 MG/ML IJ SOLN: 6.2500 mg | INTRAMUSCULAR | Status: DC | PRN

## 2018-02-12 MED ORDER — LACTATED RINGERS IV SOLN
INTRAVENOUS | Status: DC | PRN
  Administered 2018-02-12 (×3): via INTRAVENOUS

## 2018-02-12 MED ORDER — THROMBIN 20000 UNITS EX SOLR
CUTANEOUS | Status: AC
  Filled 2018-02-12: qty 20000

## 2018-02-12 MED ORDER — BUPIVACAINE LIPOSOME 1.3 % IJ SUSP
INTRAMUSCULAR | Status: DC | PRN
  Administered 2018-02-12: 20 mL

## 2018-02-12 MED ORDER — SURGIFOAM 100 EX MISC
CUTANEOUS | Status: DC | PRN
  Administered 2018-02-12: 20 mL via TOPICAL

## 2018-02-12 MED ORDER — CEFAZOLIN SODIUM-DEXTROSE 2-4 GM/100ML-% IV SOLN
INTRAVENOUS | Status: AC
  Filled 2018-02-12: qty 100

## 2018-02-12 MED ORDER — BUPIVACAINE HCL (PF) 0.25 % IJ SOLN
INTRAMUSCULAR | Status: AC
  Filled 2018-02-12: qty 30

## 2018-02-12 MED ORDER — BUPIVACAINE LIPOSOME 1.3 % IJ SUSP
20.0000 mL | Freq: Once | INTRAMUSCULAR | Status: DC
  Filled 2018-02-12: qty 20

## 2018-02-12 MED ORDER — LIDOCAINE 2% (20 MG/ML) 5 ML SYRINGE
INTRAMUSCULAR | Status: DC | PRN
  Administered 2018-02-12: 80 mg via INTRAVENOUS

## 2018-02-12 MED ORDER — VANCOMYCIN HCL 1000 MG IV SOLR
INTRAVENOUS | Status: AC
  Filled 2018-02-12: qty 1000

## 2018-02-12 MED ORDER — OXYCODONE HCL 5 MG PO TABS: 5.0000 mg | ORAL_TABLET | Freq: Once | ORAL | Status: DC | PRN

## 2018-02-12 SURGICAL SUPPLY — 84 items
BAG DECANTER FOR FLEXI CONT (MISCELLANEOUS) ×3 IMPLANT
BENZOIN TINCTURE PRP APPL 2/3 (GAUZE/BANDAGES/DRESSINGS) ×3 IMPLANT
BLADE CLIPPER SURG (BLADE) IMPLANT
BLADE SURG 11 STRL SS (BLADE) ×3 IMPLANT
BONE VIVIGEN FORMABLE 5.4CC (Bone Implant) ×3 IMPLANT
BUR CUTTER 7.0 ROUND (BURR) ×3 IMPLANT
BUR MATCHSTICK NEURO 3.0 LAGG (BURR) ×3 IMPLANT
CAGE RISE 11-17-15 10X26 (Cage) ×6 IMPLANT
CANISTER SUCT 3000ML PPV (MISCELLANEOUS) ×3 IMPLANT
CAP LOCKING THREADED (Cap) ×6 IMPLANT
CARTRIDGE OIL MAESTRO DRILL (MISCELLANEOUS) ×1 IMPLANT
CATH COUDE FOLEY 2W 5CC 16FR (CATHETERS) ×3 IMPLANT
CATH COUDE FOLEY 2W 5CC 18FR (CATHETERS) ×3 IMPLANT
CLAMP REVERE ADDITION 5.5-5.5 (Clamp) ×6 IMPLANT
CLOSURE STERI-STRIP 1/2X4 (GAUZE/BANDAGES/DRESSINGS) ×1
CLOSURE WOUND 1/2 X4 (GAUZE/BANDAGES/DRESSINGS) ×2
CLSR STERI-STRIP ANTIMIC 1/2X4 (GAUZE/BANDAGES/DRESSINGS) ×2 IMPLANT
CONT SPEC 4OZ CLIKSEAL STRL BL (MISCELLANEOUS) ×3 IMPLANT
COVER BACK TABLE 60X90IN (DRAPES) ×3 IMPLANT
CROSSLINK SPINAL 38-50MM (Neuro Prosthesis/Implant) ×3 IMPLANT
DECANTER SPIKE VIAL GLASS SM (MISCELLANEOUS) ×3 IMPLANT
DERMABOND ADVANCED (GAUZE/BANDAGES/DRESSINGS) ×2
DERMABOND ADVANCED .7 DNX12 (GAUZE/BANDAGES/DRESSINGS) ×1 IMPLANT
DIFFUSER DRILL AIR PNEUMATIC (MISCELLANEOUS) ×3 IMPLANT
DRAPE C-ARM 42X72 X-RAY (DRAPES) ×6 IMPLANT
DRAPE C-ARMOR (DRAPES) IMPLANT
DRAPE HALF SHEET 40X57 (DRAPES) IMPLANT
DRAPE LAPAROTOMY 100X72X124 (DRAPES) ×3 IMPLANT
DRAPE SURG 17X23 STRL (DRAPES) ×3 IMPLANT
DRSG OPSITE 4X5.5 SM (GAUZE/BANDAGES/DRESSINGS) ×3 IMPLANT
DRSG OPSITE POSTOP 4X6 (GAUZE/BANDAGES/DRESSINGS) ×3 IMPLANT
DURAPREP 26ML APPLICATOR (WOUND CARE) ×3 IMPLANT
ELECT REM PT RETURN 9FT ADLT (ELECTROSURGICAL) ×3
ELECTRODE REM PT RTRN 9FT ADLT (ELECTROSURGICAL) ×1 IMPLANT
EVACUATOR 3/16  PVC DRAIN (DRAIN)
EVACUATOR 3/16 PVC DRAIN (DRAIN) IMPLANT
FIBER BOAT ALLOFUSE 7.5CC (Bone Implant) ×3 IMPLANT
GAUZE SPONGE 4X4 12PLY STRL (GAUZE/BANDAGES/DRESSINGS) ×3 IMPLANT
GAUZE SPONGE 4X4 16PLY XRAY LF (GAUZE/BANDAGES/DRESSINGS) IMPLANT
GLOVE BIO SURGEON STRL SZ 6.5 (GLOVE) ×2 IMPLANT
GLOVE BIO SURGEON STRL SZ7 (GLOVE) ×18 IMPLANT
GLOVE BIO SURGEON STRL SZ8 (GLOVE) ×6 IMPLANT
GLOVE BIO SURGEONS STRL SZ 6.5 (GLOVE) ×1
GLOVE BIOGEL PI IND STRL 7.0 (GLOVE) IMPLANT
GLOVE BIOGEL PI IND STRL 7.5 (GLOVE) ×1 IMPLANT
GLOVE BIOGEL PI INDICATOR 7.0 (GLOVE)
GLOVE BIOGEL PI INDICATOR 7.5 (GLOVE) ×2
GLOVE ECLIPSE 7.5 STRL STRAW (GLOVE) IMPLANT
GLOVE EXAM NITRILE LRG STRL (GLOVE) IMPLANT
GLOVE EXAM NITRILE XL STR (GLOVE) IMPLANT
GLOVE EXAM NITRILE XS STR PU (GLOVE) IMPLANT
GLOVE INDICATOR 8.5 STRL (GLOVE) ×6 IMPLANT
GOWN STRL REUS W/ TWL LRG LVL3 (GOWN DISPOSABLE) IMPLANT
GOWN STRL REUS W/ TWL XL LVL3 (GOWN DISPOSABLE) ×2 IMPLANT
GOWN STRL REUS W/TWL 2XL LVL3 (GOWN DISPOSABLE) IMPLANT
GOWN STRL REUS W/TWL LRG LVL3 (GOWN DISPOSABLE)
GOWN STRL REUS W/TWL XL LVL3 (GOWN DISPOSABLE) ×4
HEMOSTAT POWDER KIT SURGIFOAM (HEMOSTASIS) IMPLANT
KIT BASIN OR (CUSTOM PROCEDURE TRAY) ×3 IMPLANT
KIT TURNOVER KIT B (KITS) ×3 IMPLANT
MILL MEDIUM DISP (BLADE) ×6 IMPLANT
NEEDLE HYPO 21X1.5 SAFETY (NEEDLE) ×3 IMPLANT
NEEDLE HYPO 25X1 1.5 SAFETY (NEEDLE) ×3 IMPLANT
NS IRRIG 1000ML POUR BTL (IV SOLUTION) ×3 IMPLANT
OIL CARTRIDGE MAESTRO DRILL (MISCELLANEOUS) ×3
PACK LAMINECTOMY NEURO (CUSTOM PROCEDURE TRAY) ×3 IMPLANT
PAD ARMBOARD 7.5X6 YLW CONV (MISCELLANEOUS) ×9 IMPLANT
ROD 75MM SPINAL (Rod) ×3 IMPLANT
ROD CVD COBALT 5.5X75 (Rod) ×6 IMPLANT
SCREW PA THRD CREO TULIP 5.5X4 (Head) ×6 IMPLANT
SCREW SPINE CREO AMP 6.5X50 (Screw) ×6 IMPLANT
SET CYSTO W/LG BORE CLAMP LF (SET/KITS/TRAYS/PACK) ×3 IMPLANT
SPONGE LAP 4X18 RFD (DISPOSABLE) IMPLANT
SPONGE SURGIFOAM ABS GEL 100 (HEMOSTASIS) ×3 IMPLANT
STRIP CLOSURE SKIN 1/2X4 (GAUZE/BANDAGES/DRESSINGS) ×4 IMPLANT
SUT VIC AB 0 CT1 18XCR BRD8 (SUTURE) ×2 IMPLANT
SUT VIC AB 0 CT1 8-18 (SUTURE) ×4
SUT VIC AB 2-0 CT1 18 (SUTURE) ×3 IMPLANT
SUT VIC AB 4-0 PS2 27 (SUTURE) ×3 IMPLANT
SYRINGE 20CC LL (MISCELLANEOUS) ×3 IMPLANT
TOWEL GREEN STERILE (TOWEL DISPOSABLE) ×3 IMPLANT
TOWEL GREEN STERILE FF (TOWEL DISPOSABLE) ×3 IMPLANT
TRAY FOLEY MTR SLVR 16FR STAT (SET/KITS/TRAYS/PACK) ×3 IMPLANT
WATER STERILE IRR 1000ML POUR (IV SOLUTION) ×3 IMPLANT

## 2018-02-12 NOTE — H&P (Signed)
Christian Mcintyre is an 62 y.o. male.   Chief Complaint: back and left greater than right leg pain HPI: 37-year-old gentleman with 6-7 weeks of progressive back and right leg and pain as well as pain rating down his left leg. Left leg pain seems correlate with a left L3 nerve root pattern right leg has weakness distally with a foot drop has progressed over the last 6 weeks. Workup including an MRI scan has shown a large disc herniation at L2-3 above his previous L3-S1 1 fusion with a large free fragment migrating caudally displacing the left L3 nerve roots left L3 pedicle. Due to patient's progression of clinical syndrome imaging findings and failure conservative treatment I recommended decompression stable position procedure at L2-3. I've extensively reviewed the risks and benefits of the procedure with him as well as perioperative course expectations of outcome and alternatives of surgery and he understands and agrees to proceed forward.  Past Medical History:  Diagnosis Date  . Hypertension   . Sleep apnea    cpap  . Stroke [facility]) 2016   TIA    Past Surgical History:  Procedure Laterality Date  . BACK SURGERY     x2  . COLONOSCOPY    . EP IMPLANTABLE DEVICE N/A 05/31/2015   Procedure: Loop Recorder Insertion;  Surgeon: Hillis Range, MD;  Location: [facility] INVASIVE CV LAB;  Service: Cardiovascular;  Laterality: N/A;  . HERNIA REPAIR     umbical  . lumbar L4/5 fusion in 2008 Bilateral 2008  . TEE WITHOUT CARDIOVERSION N/A 05/16/2015   Procedure: TRANSESOPHAGEAL ECHOCARDIOGRAM (TEE);  Surgeon: Vesta Mixer, MD;  Location: [facility] ENDOSCOPY;  Service: Cardiovascular;  Laterality: N/A;    Family History  Problem Relation Age of Onset  . Heart attack Maternal Grandfather   . Hypertension Brother    Social History:  reports that he quit smoking about 19 years ago. He quit after 35.00 years of use. He has never used smokeless tobacco. He reports that he drinks about 1.2 oz of alcohol per week. He reports  that he does not use drugs.  Allergies: No Known Allergies  Medications Prior to Admission  Medication Sig Dispense Refill  . amLODipine (NORVASC) 5 MG tablet Take 5 mg by mouth daily.    Marland Kitchen aspirin EC 325 MG EC tablet Take 1 tablet (325 mg total) by mouth daily. 30 tablet 0  . atorvastatin (LIPITOR) 40 MG tablet Take 1 tablet (40 mg total) by mouth daily at 6 PM. 30 tablet 0  . valsartan-hydrochlorothiazide (DIOVAN-HCT) 320-12.5 MG per tablet Take 1 tablet by mouth daily.    . Vitamin E 400 units TABS Take 800 Units by mouth daily.     Marland Kitchen CIALIS 10 MG tablet       Results for orders placed or performed during the hospital encounter of 02/12/18 (from the past 48 hour(s))  CBC     Status: None   Collection Time: 02/12/18  2:11 PM  Result Value Ref Range   WBC 4.1 4.0 - 10.5 K/uL   RBC 4.36 4.22 - 5.81 MIL/uL   Hemoglobin 14.0 13.0 - 17.0 g/dL   HCT 16.1 09.6 - 04.5 %   MCV 97.2 78.0 - 100.0 fL   MCH 32.1 26.0 - 34.0 pg   MCHC 33.0 30.0 - 36.0 g/dL   RDW 40.9 81.1 - 91.4 %   Platelets 206 150 - 400 K/uL    Comment: Performed at [facility], [address], [place], [place] [address]  Type and screen All Cardiac and thoracic surgeries, spinal fusions, myomectomies, craniotomies, colon & liver resections, total joint revisions, same day c-section with placenta previa or accreta.     Status: None   Collection Time: 02/12/18  2:41 PM  Result Value Ref Range   ABO/RH(D) A POS    Antibody Screen NEG    Sample Expiration 02/15/2018    No results found.  Review of Systems  Musculoskeletal: Positive for back pain and joint pain.  Skin: Positive for itching.  Neurological: Positive for sensory change and focal weakness.    Blood pressure 132/89, pulse 99, temperature 99.4 F (37.4 C), temperature source Oral, resp. rate 20, SpO2 97 %. Physical Exam  Constitutional: He is oriented to person, place, and time. He appears well-developed and well-nourished.  HENT:  Head:  Normocephalic.  Eyes: Pupils are equal, round, and reactive to light. EOM are normal.  Neck: Normal range of motion.  Respiratory: Effort normal.  GI: Soft. Bowel sounds are normal.  Neurological: He is alert and oriented to person, place, and time. He has normal strength. GCS eye subscore is 4. GCS verbal subscore is 5. GCS motor subscore is 6.  Patient strength is 5 out of 5 iliopsoas bilaterally quadriceps on the left weak at 4-4+ out of 5 decreased sensation left L3 nerve root this region. Right looks to me has some distal weakness with L4-L5 partial foot drop and 4-5 EHL. Decreased sensation right lower extremity.  Skin: Skin is warm and dry.     Assessment/Plan 62 year old gentleman presents for decompression stabilization procedure at L2-3.  Olie Scaffidi P, MD 02/12/2018, 3:35 PM

## 2018-02-12 NOTE — Anesthesia Procedure Notes (Signed)
Procedure Name: Intubation Date/Time: 02/12/2018 6:38 PM Performed by: Izola Priceockfield, Jessy Calixte Walton Jr., CRNA Pre-anesthesia Checklist: Patient identified, Emergency Drugs available, Suction available and Patient being monitored Patient Re-evaluated:Patient Re-evaluated prior to induction Oxygen Delivery Method: Circle system utilized Preoxygenation: Pre-oxygenation with 100% oxygen Induction Type: IV induction Ventilation: Mask ventilation without difficulty and Oral airway inserted - appropriate to patient size Laryngoscope Size: Hyacinth MeekerMiller and 3 Grade View: Grade I Tube type: Oral Tube size: 7.5 mm Number of attempts: 1 Airway Equipment and Method: Stylet and Oral airway Placement Confirmation: ETT inserted through vocal cords under direct vision,  positive ETCO2 and breath sounds checked- equal and bilateral Secured at: 23 cm Tube secured with: Tape Dental Injury: Teeth and Oropharynx as per pre-operative assessment

## 2018-02-12 NOTE — Op Note (Signed)
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  Postoperative diagnosis: Same  Procedure: Decompressive lumbar laminectomy L2-3 with complete medial facetectomies radical foraminotomies of the L2 and L3 nerve roots in excess and requiring more work than would be needed with a standard interbody fusion  #2 posterior lumbar interbody fusion L2-3 utilizing the globus expandable cage system packed with locally harvested autograft mixed with vivigen  #3 pedicle screw fixation L2-L3 with bilateral pedicle screws globus Creole amp modular placed at L2 and utilizing addition set was side connectors I anchored into the rodpre-existing between L3 and L4  #4 posterior lateral arthrodesis L2-3 utilizing the locally harvested autograft mixedwith vivigen and cortical fiber boats  Surgeon: Jillyn HiddenGary Gurvir Schrom  Anesthesia: Gen.  EBL: About 500  History of present illness: 62 year old [place] status post L3 S1 fusion presented with acute onset progressive worsening back pain and left g pain and right leg weakness. Workup revealed large disc herniation causing severe stenosis at L2-3. Due to patient progressive clinical syndrome imaging findings and failure conservative treatment I recommended decompression stabi procedure at L2-3 I extensively went over the risks and benefits of that operation with him as well as perioperative course expectations of outcome and alternatives of surgery and he understood and agreed to proceed forward.  Operative procedure: Patient was brought into the or was induced on general anesthesia and positioned prone on the Wilson frame his back was prepped and draped in routine sterile fashion. Is old incision was opened up and extended cephalad subperiosteal dissections care lamina of L1 L2 and expose the hardware to down below L4. The spinous process at L2 was removed complete central decompression was begun complete medial facetectomiesere  performed work into the scar tissue I extended laminotomy down to just below the L3 cortical screw placement. At this point I inspected the disc space and the patient's left side large several large free fragments Migrated caudally displacing the L3 nerve roots the L3 pedicle. These were all removed with I nerve hook and coronary dilator disc spaces cleanout radical both sides then utilizing sequential distraction with a 12 distractor in place is selected and 17 expandable cages and abdomen were packed with local autograft mixed there were inserted local autograft mix was also packed centrally prior to contralateral cage placement occasional mild disc space significantly reduced the compression there is no further stenosis and no further herniation. There is no further stenosis on thecal sac or either L2 or L3 nerve roots. Then bilateral pedicle screws placed at L3 and sterile fashion routine with 6.5 x 50 mm screws placed on the fluoroscopy the selected site and side connectors divided into the rod between L3 and L4 the old fusion did appear to be solid. I aggressively decorticated the TPs lateral gutters packed local autograft mixed posterior laterally from L2-L3. Then the foraminal reinspected confirm patency no migration of graft material Gelfoam was opened up the dura vancomycin powder was present in the wound. X Burrell was injected in the fascia large Hemovac drain was placed and the wounds closed in layers with interrupted Vicryl running 4 subcuticular. Dermabond benzo and Steri-Strips and sterile dressing was applied patient recovered in stable condition. At the end the case all needle counts sponge counts were correct.

## 2018-02-12 NOTE — Transfer of Care (Signed)
Immediate Anesthesia Transfer of Care Note  Patient: Christian Mcintyre  Procedure(s) Performed: Lumbar 2-3 posterior lumbar interbody fusion/extension addition to Lumbar 3 to Sacral 1 (N/A Back)  Patient Location: PACU  Anesthesia Type:General  Level of Consciousness: awake, alert , oriented and patient cooperative  Airway & Oxygen Therapy: Patient Spontanous Breathing  Post-op Assessment: Report given to RN and Post -op Vital signs reviewed and stable  Post vital signs: Reviewed and stable  Last Vitals:  Vitals Value Taken Time  BP    Temp    Pulse    Resp    SpO2      Last Pain:  Vitals:   02/12/18 1436  TempSrc: Oral         Complications: No apparent anesthesia complications

## 2018-02-12 NOTE — OR Nursing (Signed)
Foley catheter attempted by J. Key RN and Dr. Donalee CitrinGary Cram. Urology notified. Dr. Orion ModestGopal Narang successfully placed foley catheter at 1920.

## 2018-02-12 NOTE — Consult Note (Signed)
Urology Consult Note   Requesting Attending Physician:  Kary Kos, MD Service Providing Consult: Urology  Consulting Attending: Junious Silk    Reason for Consult:  Difficult catheter   HPI: Christian Mcintyre is seen in consultation for reasons noted above at the request of Kary Kos, MD for evaluation of difficult foley placement .  This is a 62 y.o. male with hx of left leg pain scheduled for Lumbar fusion. Operative team was unable to place foley catheter after induction. A 77f straight, 144fcoude and 1853foude were attempted and failed.   Urology was consulted for assistane. An 22f84fude was attempted and we met resistance in the region of the prostatic urethra.   Flexible cystoscopy was performed, details below, and a false passage was noted. This passage was anterior to the true lumen. Gentle probing with a sensor wire allowed for passage of the flexible scope in to the bladder. A 16fr35fncil tip foley was then easily placed.   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    Past Medical History: Past Medical History:  Diagnosis Date  . Hypertension   . Sleep apnea    cpap  . Stroke (HCC)[facility]   TIA    Past Surgical History:  Past Surgical History:  Procedure Laterality Date  . BACK SURGERY     x2  . COLONOSCOPY    . EP IMPLANTABLE DEVICE N/A 05/31/2015   Procedure: Loop Recorder Insertion;  Surgeon: JamesThompson Grayer  [facility];  Service: Cardiovascular;  Laterality: N/A;  . HERNIA REPAIR     umbical  . lumbar L4/5 fusion in 2008 Bilateral 2008  . TEE WITHOUT CARDIOVERSION N/A 05/16/2015   Procedure: TRANSESOPHAGEAL ECHOCARDIOGRAM (TEE);  Surgeon: PhiliThayer Headings  [facility];  Service: Cardiovascular;  Laterality: N/A;    Medication: Current Facility-Administered Medications  Medication Dose Route Frequency Provider Last  Rate Last Dose  . 0.9 % irrigation (POUR BTL)    PRN Cram,Kary Kos  1,000 mL at 02/12/18 1831  . bacitracin 50,000 Units in sodium chloride 0.9 % 500 mL irrigation    PRN Cram,Kary Kos  500 mL at 02/12/18 1832  . bupivacaine liposome (EXPAREL) 1.3 % injection 266 mg  20 mL Infiltration Once Cram,Kary Kos     . ceFAZolin (ANCEF) 2-4 GM/100ML-% IVPB           . Chlorhexidine Gluconate Cloth 2 % PADS 6 each  6 each Topical Once Cram,Kary Kos      And  . Chlorhexidine Gluconate Cloth 2 % PADS 6 each  6 each Topical Once Cram,Kary Kos     . lactated ringers infusion   Intravenous Continuous Ellender, Ryan Karyl Kinnier     . lidocaine-EPINEPHrine (XYLOCAINE W/EPI) 1 %-1:100000 (with pres) injection    PRN Cram,Kary Kos  10 mL at 02/12/18 1941  . Surgifoam 100 with Thrombin 20,000 units (20 ml) topical solution    PRN Cram,Kary Kos  20 mL at 02/12/18 2001   Facility-Administered Medications Ordered in Other Encounters  Medication Dose Route Frequency Provider Last Rate Last Dose  . dexamethasone (DECADRON) injection   Intravenous Anesthesia Intra-op Cockfield, FlynnTommy RainwaterNA   10 mg at 02/12/18 1819  . fentaNYL (SUBLIMAZE) injection   Intravenous Anesthesia Intra-op CockfMarvetta Gibbons  Particia Lather., CRNA   100 mcg at 02/12/18 1922  . lactated ringers infusion   Intravenous Continuous PRN Cockfield, Tommy Rainwater., CRNA      . lidocaine 2% (20 mg/mL) 5 mL syringe   Intravenous Anesthesia Intra-op Cockfield, Tommy Rainwater., CRNA   80 mg at 02/12/18 1813  . midazolam (VERSED) injection   Intravenous Anesthesia Intra-op Cockfield, Tommy Rainwater., CRNA   2 mg at 02/12/18 1756  . propofol (DIPRIVAN) 10 mg/mL bolus/IV push   Intravenous Anesthesia Intra-op Cockfield, Tommy Rainwater., CRNA   50 mg at 02/12/18 1816  . rocuronium bromide 10 mg/mL (PF) syringe   Intravenous Anesthesia Intra-op Cockfield, Tommy Rainwater., CRNA   50 mg at 02/12/18 1924    Allergies: No Known  Allergies  Social History: Social History   Tobacco Use  . Smoking status: Former Smoker    Years: 35.00    Last attempt to quit: 09/02/1998    Years since quitting: 19.4  . Smokeless tobacco: Never Used  Substance Use Topics  . Alcohol use: Yes    Alcohol/week: 1.2 oz    Types: 2 Glasses of wine per week    Comment: daily wine  . Drug use: No    Family History Family History  Problem Relation Age of Onset  . Heart attack Maternal Grandfather   . Hypertension Brother     Review of Systems 10 systems were reviewed and are negative except as noted specifically in the HPI.  Objective   Vital signs in last 24 hours: BP 132/89   Pulse 99   Temp 99.4 F (37.4 C) (Oral)   Resp 20   SpO2 97%   Physical Exam General: Intubated on stretcher.  GU: Circumcised phallus, normal bilateral descended testicles.  Extremities: warm and well perfused  Most Recent Labs: Lab Results  Component Value Date   WBC 4.1 02/12/2018   HGB 14.0 02/12/2018   HCT 42.4 02/12/2018   PLT 206 02/12/2018    Lab Results  Component Value Date   NA 141 02/12/2018   K 3.9 02/12/2018   CL 105 02/12/2018   CO2 24 02/12/2018   BUN 9 02/12/2018   CREATININE 0.89 02/12/2018   CALCIUM 9.3 02/12/2018    Lab Results  Component Value Date   INR 1.03 04/28/2015   APTT 29 04/28/2015     IMAGING: No results found.  Procedure:  Foley Catheter Placement Note  Indications: 62 y.o. male with difficult foley placement   Pre-operative Diagnosis: Urinary retention  Post-operative Diagnosis: Same  Surgeon: Alla Feeling, MD, MD  Assistants: None  Procedure Details  Patient was placed in the supine position, prepped with Betadine and draped in the usual sterile fashion.  We injected lidocaine jelly per urethra prior to the procedure. We then performed flexible cystoscopy and noted a false passage, beginning in the proximal bulbar urethra. We probed and evaluated the anterior wall but not  flap was noted. We then evaluated the entire circumference of the urethra and noted a posterior flap. A sensor wire was passed through the flap and the flexible scope advanced into the bladder. Next the wire was confirmed in the bladder and the scope removed. A 16 fr council tip foley was passed into the bladder, over the sensor wire, with good urine return. 10cc of sterile water was placed in the foley baloon.                Complications: None; patient tolerated the  procedure well.  Plan:   1. See below       Attending Attestation: Dr. Junious Silk  was present   ------  Assessment:  62 y.o. male with planned lumbar fusion with difficult foley placement requiring cystoscopic placement. 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 A 35f council tip is now in place   Recommendations: - Follow up in office in 5-7 days for voiding trial.    Thank you for this consult. Please contact the urology consult pager with any further questions/concerns.

## 2018-02-12 NOTE — Anesthesia Preprocedure Evaluation (Addendum)
Anesthesia Evaluation  Patient identified by MRN, date of birth, ID band Patient awake    Reviewed: Allergy & Precautions, NPO status , Patient's Chart, lab work & pertinent test results  Airway Mallampati: II  TM Distance: >3 FB Neck ROM: Full    Dental no notable dental hx.    Pulmonary sleep apnea and Continuous Positive Airway Pressure Ventilation , former smoker,    Pulmonary exam normal breath sounds clear to auscultation       Cardiovascular hypertension, Pt. 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  Loop recorder  Sees cardiologist ([facility])   Neuro/Psych TIACVA, No Residual Symptoms negative psych ROS   GI/Hepatic negative GI ROS, Neg liver ROS,   Endo/Other  negative endocrine ROS  Renal/GU negative Renal ROS     Musculoskeletal Chronic back pain   Abdominal   Peds  Hematology HLD   Anesthesia Other Findings Herniated lumbar disc without myelopathy, Foot drop  Reproductive/Obstetrics                            Anesthesia Physical Anesthesia Plan  ASA: III  Anesthesia Plan: General   Post-op Pain Management:    Induction: Intravenous  PONV Risk Score and Plan: 2 and Ondansetron, Dexamethasone, Midazolam and Treatment may vary due to age or medical condition  Airway Management Planned: Oral ETT  Additional Equipment:   Intra-op Plan:   Post-operative Plan: Extubation in OR  Informed Consent: I have reviewed the patients History and Physical, chart, labs and discussed the procedure including the risks, benefits and alternatives for the proposed anesthesia with the patient or authorized representative who has indicated his/her understanding and acceptance.   Dental advisory given  Plan Discussed with: CRNA  Anesthesia Plan Comments:         Anesthesia Quick Evaluation

## 2018-02-13 MED ORDER — SODIUM CHLORIDE 0.9 % IV SOLN: 250.0000 mL | INTRAVENOUS | Status: DC

## 2018-02-13 MED ORDER — CYCLOBENZAPRINE HCL 10 MG PO TABS
10.0000 mg | ORAL_TABLET | Freq: Three times a day (TID) | ORAL | Status: DC | PRN
  Administered 2018-02-13 (×2): 10 mg via ORAL
  Filled 2018-02-13 (×2): qty 1

## 2018-02-13 MED ORDER — SODIUM CHLORIDE 0.9% FLUSH: 3.0000 mL | Freq: Two times a day (BID) | INTRAVENOUS | Status: DC

## 2018-02-13 MED ORDER — CEFAZOLIN SODIUM-DEXTROSE 2-4 GM/100ML-% IV SOLN
2.0000 g | Freq: Three times a day (TID) | INTRAVENOUS | Status: DC
  Administered 2018-02-13 (×2): 2 g via INTRAVENOUS
  Filled 2018-02-13 (×2): qty 100

## 2018-02-13 MED ORDER — ASPIRIN EC 325 MG PO TBEC
325.0000 mg | DELAYED_RELEASE_TABLET | Freq: Every day | ORAL | Status: DC
  Administered 2018-02-14: 325 mg via ORAL
  Filled 2018-02-13 (×2): qty 1

## 2018-02-13 MED ORDER — PHENOL 1.4 % MT LIQD: 1.0000 | OROMUCOSAL | Status: DC | PRN

## 2018-02-13 MED ORDER — VITAMIN E 180 MG (400 UNIT) PO CAPS
800.0000 [IU] | ORAL_CAPSULE | Freq: Every day | ORAL | Status: DC
  Administered 2018-02-13 – 2018-02-14 (×2): 800 [IU] via ORAL
  Filled 2018-02-13 (×2): qty 2

## 2018-02-13 MED ORDER — VALSARTAN-HYDROCHLOROTHIAZIDE 320-12.5 MG PO TABS: 1.0000 | ORAL_TABLET | Freq: Every day | ORAL | Status: DC

## 2018-02-13 MED ORDER — PANTOPRAZOLE SODIUM 40 MG PO TBEC
40.0000 mg | DELAYED_RELEASE_TABLET | Freq: Every day | ORAL | Status: DC
  Administered 2018-02-13 – 2018-02-14 (×2): 40 mg via ORAL
  Filled 2018-02-13 (×2): qty 1

## 2018-02-13 MED ORDER — SODIUM CHLORIDE 0.9% FLUSH: 3.0000 mL | INTRAVENOUS | Status: DC | PRN

## 2018-02-13 MED ORDER — IRBESARTAN 300 MG PO TABS
300.0000 mg | ORAL_TABLET | Freq: Every day | ORAL | Status: DC
  Administered 2018-02-13 – 2018-02-14 (×2): 300 mg via ORAL
  Filled 2018-02-13 (×2): qty 1

## 2018-02-13 MED ORDER — HYDROCHLOROTHIAZIDE 12.5 MG PO CAPS
12.5000 mg | ORAL_CAPSULE | Freq: Every day | ORAL | Status: DC
  Administered 2018-02-13 – 2018-02-14 (×2): 12.5 mg via ORAL
  Filled 2018-02-13 (×2): qty 1

## 2018-02-13 MED ORDER — ONDANSETRON HCL 4 MG/2ML IJ SOLN: 4.0000 mg | Freq: Four times a day (QID) | INTRAMUSCULAR | Status: DC | PRN

## 2018-02-13 MED ORDER — ACETAMINOPHEN 650 MG RE SUPP: 650.0000 mg | RECTAL | Status: DC | PRN

## 2018-02-13 MED ORDER — ATORVASTATIN CALCIUM 20 MG PO TABS
40.0000 mg | ORAL_TABLET | Freq: Every day | ORAL | Status: DC
  Administered 2018-02-13: 40 mg via ORAL
  Filled 2018-02-13: qty 2

## 2018-02-13 MED ORDER — ACETAMINOPHEN 325 MG PO TABS: 650.0000 mg | ORAL_TABLET | ORAL | Status: DC | PRN

## 2018-02-13 MED ORDER — ONDANSETRON HCL 4 MG PO TABS: 4.0000 mg | ORAL_TABLET | Freq: Four times a day (QID) | ORAL | Status: DC | PRN

## 2018-02-13 MED ORDER — OXYCODONE HCL 5 MG PO TABS
10.0000 mg | ORAL_TABLET | ORAL | Status: DC | PRN
  Administered 2018-02-13 – 2018-02-14 (×8): 10 mg via ORAL
  Filled 2018-02-13 (×8): qty 2

## 2018-02-13 MED ORDER — HYDROMORPHONE HCL 1 MG/ML IJ SOLN: 0.5000 mg | INTRAMUSCULAR | Status: DC | PRN

## 2018-02-13 MED ORDER — AMLODIPINE BESYLATE 5 MG PO TABS
5.0000 mg | ORAL_TABLET | Freq: Every day | ORAL | Status: DC
  Administered 2018-02-13 – 2018-02-14 (×2): 5 mg via ORAL
  Filled 2018-02-13 (×2): qty 1

## 2018-02-13 MED ORDER — ALUM & MAG HYDROXIDE-SIMETH 200-200-20 MG/5ML PO SUSP: 30.0000 mL | Freq: Four times a day (QID) | ORAL | Status: DC | PRN

## 2018-02-13 MED ORDER — MENTHOL 3 MG MT LOZG: 1.0000 | LOZENGE | OROMUCOSAL | Status: DC | PRN

## 2018-02-13 NOTE — Evaluation (Signed)
Physical Therapy Evaluation Patient Details Name: Christian Mcintyre MRN: [id number] DOB: [birth year] Today's Date: 02/13/2018   History of Present Illness  Pt is a 62 y/o male who presents s/p L2-L3 PLIF on 02/12/18. PMH significant for CVA, HTN.  Clinical Impression  Pt admitted with above diagnosis. Pt currently with functional limitations due to the deficits listed below (see PT Problem List). At the time of PT eval pt was able to perform transfers and ambulation with gross min guard assist to supervision for safety with gait belt donned. Pt will benefit from skilled PT to increase their independence and safety with mobility to allow discharge to the venue listed below.       Follow Up Recommendations No PT follow up;Supervision - Intermittent    Equipment Recommendations  None recommended by PT    Recommendations for Other Services       Precautions / Restrictions Precautions Precautions: Fall;Back Precaution Booklet Issued: Yes (comment) Precaution Comments: Reviewed in detail with pt and wife. Pt was cued for precautions during functional mobility.  Required Braces or Orthoses: Spinal Brace Spinal Brace: Lumbar corset;Applied in sitting position Restrictions Weight Bearing Restrictions: No      Mobility  Bed Mobility               General bed mobility comments: Pt sitting up on EOB when PT arrived.   Transfers Overall transfer level: Needs assistance Equipment used: None Transfers: Sit to/from Stand Sit to Stand: Min guard         General transfer comment: Close guard for safety as pt powered up to full stand. VC's for hand placement on seated surface for safety.   Ambulation/Gait Ambulation/Gait assistance: Supervision Gait Distance (Feet): 400 Feet Assistive device: None Gait Pattern/deviations: Step-through pattern;Decreased stride length;Trunk flexed Gait velocity: Decreased Gait velocity interpretation: 1.31 - 2.62 ft/sec, indicative of limited community  ambulator General Gait Details: VC's for improved posture. Pt was able to ambulate well without overt LOB. Antalgic due to pain  Stairs Stairs: Yes Stairs assistance: Min guard Stair Management: One rail Right;Step to pattern;Forwards Number of Stairs: 10 General stair comments: VC's for sequencing and general safety. Pt completed full flight to simulate home environment.   Wheelchair Mobility    Modified Rankin (Stroke Patients Only)       Balance Overall balance assessment: Needs assistance Sitting-balance support: Feet supported;No upper extremity supported Sitting balance-Leahy Scale: Fair     Standing balance support: No upper extremity supported;During functional activity Standing balance-Leahy Scale: Fair                               Pertinent Vitals/Pain Pain Assessment: Faces Faces Pain Scale: Hurts little more Pain Location: Incision site Pain Descriptors / Indicators: Operative site guarding;Aching Pain Intervention(s): Limited activity within patient's tolerance;Monitored during session;Repositioned    Home Living Family/patient expects to be discharged to:: Private residence Living Arrangements: Spouse/significant other Available Help at Discharge: Family;Available 24 hours/day Type of Home: House Home Access: Stairs to enter   [employer] CorporationEntrance Stairs-Number of Steps: 1 Home Layout: Two level Home Equipment: Cane - single point;Shower seat - built in      Prior Function Level of Independence: Independent               Hand Dominance   Dominant Hand: Right    Extremity/Trunk Assessment   Upper Extremity Assessment Upper Extremity Assessment: Overall WFL for tasks assessed    Lower Extremity Assessment  Lower Extremity Assessment: Generalized weakness    Cervical / Trunk Assessment Cervical / Trunk Assessment: Other exceptions Cervical / Trunk Exceptions: s/ps spinal surgery  Communication   Communication: No difficulties   Cognition Arousal/Alertness: Awake/alert Behavior During Therapy: WFL for tasks assessed/performed Overall Cognitive Status: Within Functional Limits for tasks assessed                                        General Comments      Exercises     Assessment/Plan    PT Assessment Patient needs continued PT services  PT Problem List Decreased strength;Decreased range of motion;Decreased activity tolerance;Decreased balance;Decreased mobility;Decreased knowledge of use of DME;Decreased safety awareness;Decreased knowledge of precautions;Pain       PT Treatment Interventions DME instruction;Gait training;Stair training;Functional mobility training;Therapeutic activities;Therapeutic exercise;Neuromuscular re-education;Patient/family education    PT Goals (Current goals can be found in the Care Plan section)  Acute Rehab PT Goals Patient Stated Goal: Home at d/c PT Goal Formulation: With patient/family Time For Goal Achievement: 02/20/18 Potential to Achieve Goals: Good    Frequency Min 5X/week   Barriers to discharge        Co-evaluation               AM-PAC PT "6 Clicks" Daily Activity  Outcome Measure Difficulty turning over in bed (including adjusting bedclothes, sheets and blankets)?: None Difficulty moving from lying on back to sitting on the side of the bed? : A Little Difficulty sitting down on and standing up from a chair with arms (e.g., wheelchair, bedside commode, etc,.)?: A Little Help needed moving to and from a bed to chair (including a wheelchair)?: A Little Help needed walking in hospital room?: A Little Help needed climbing 3-5 steps with a railing? : A Little 6 Click Score: 19    End of Session Equipment Utilized During Treatment: Gait belt;Back brace Activity Tolerance: Patient tolerated treatment well Patient left: with call bell/phone within reach;with family/visitor present;Other (comment)(In bathroom) Nurse Communication:  Mobility status(Pt in bathroom) PT Visit Diagnosis: Unsteadiness on feet (R26.81);Pain;Difficulty in walking, not elsewhere classified (R26.2) Pain - part of body: (back)    Time: 1610-9604 PT Time Calculation (min) (ACUTE ONLY): 24 min   Charges:   PT Evaluation $PT Eval Moderate Complexity: 1 Mod PT Treatments $Gait Training: 8-22 mins   PT G Codes:        Conni Slipper, PT, DPT Acute Rehabilitation Services Pager: [phone]   Marylynn Pearson 02/13/2018, 2:19 PM

## 2018-02-13 NOTE — Progress Notes (Signed)
Subjective: Patient reports moderate back pain. Denies any leg pain. STates that he feels the strength in his right foot is improving  Objective: Vital signs in last 24 hours: Temp:  [97.9 F (36.6 C)-99.4 F (37.4 C)] 98.2 F (36.8 C) (06/13 0732) Pulse Rate:  [76-101] 79 (06/13 0732) Resp:  [15-21] 18 (06/13 0732) BP: (118-146)/(68-89) 120/68 (06/13 0732) SpO2:  [96 %-97 %] 97 % (06/13 0732)  Intake/Output from previous day: 06/12 0701 - 06/13 0700 In: 2600 [I.V.:2600] Out: 1710 [Urine:780; Drains:280; Blood:650] Intake/Output this shift: No intake/output data recorded.  Neurologic: Grossly normal  Lab Results: Lab Results  Component Value Date   WBC 4.1 02/12/2018   HGB 14.0 02/12/2018   HCT 42.4 02/12/2018   MCV 97.2 02/12/2018   PLT 206 02/12/2018   Lab Results  Component Value Date   INR 1.03 04/28/2015   BMET Lab Results  Component Value Date   NA 141 02/12/2018   K 3.9 02/12/2018   CL 105 02/12/2018   CO2 24 02/12/2018   GLUCOSE 85 02/12/2018   BUN 9 02/12/2018   CREATININE 0.89 02/12/2018   CALCIUM 9.3 02/12/2018    Studies/Results: Dg Lumbar Spine 2-3 Views  Result Date: 02/12/2018 CLINICAL DATA:  PLIF L2-L3 EXAM: DG C-ARM 61-120 MIN; LUMBAR SPINE - 2-3 VIEW COMPARISON:  None. FINDINGS: Two fluoroscopic spot images obtained in the operating room demonstrate posterior fusion rods and pedicular screws, levels not determined on these provided images. There are interbody spacers at 2 consecutive levels. Total fluoroscopy time 21 seconds. IMPRESSION: Intraoperative fluoroscopy during lumbar surgery, levels not well determined on the provided images. Electronically Signed   By: Rubye OaksMelanie  Ehinger M.D.   On: 02/12/2018 22:47   Dg C-arm 1-60 Min  Result Date: 02/12/2018 CLINICAL DATA:  PLIF L2-L3 EXAM: DG C-ARM 61-120 MIN; LUMBAR SPINE - 2-3 VIEW COMPARISON:  None. FINDINGS: Two fluoroscopic spot images obtained in the operating room demonstrate posterior  fusion rods and pedicular screws, levels not determined on these provided images. There are interbody spacers at 2 consecutive levels. Total fluoroscopy time 21 seconds. IMPRESSION: Intraoperative fluoroscopy during lumbar surgery, levels not well determined on the provided images. Electronically Signed   By: Rubye OaksMelanie  Ehinger M.D.   On: 02/12/2018 22:47    Assessment/Plan: Postop day 1 plif. Doing well. Continue therapies and hemovac drain.    LOS: 1 day    Tiana LoftKimberly Hannah Deneene Tarver 02/13/2018, 10:17 AM

## 2018-02-14 MED ORDER — CYCLOBENZAPRINE HCL 10 MG PO TABS: 10.0000 mg | ORAL_TABLET | Freq: Three times a day (TID) | ORAL | 0 refills | Status: DC | PRN

## 2018-02-14 MED ORDER — OXYCODONE HCL 10 MG PO TABS: 10.0000 mg | ORAL_TABLET | ORAL | 0 refills | Status: DC | PRN

## 2018-02-14 NOTE — Progress Notes (Addendum)
Patient alert and oriented, mae's well, voiding adequate amount of urine, swallowing without difficulty, no c/o pain at time of discharge. Patient discharged home with family. Script and discharged instructions given to patient. Patient and family stated understanding of instructions given.  And patient was also educated on the care of foley catheter and emptying and safety measures.Patient has an appointment with Dr. Wynetta Emeryram

## 2018-02-14 NOTE — Discharge Summary (Signed)
  Physician Discharge Summary  Patient ID: Christian Mcintyre MRN: [id number] DOB/AGE: [birth year] 62 y.o. Estimated body mass index is 32.35 kg/m as calculated from the following:   Height as of 10/20/15: 6\' 2"  (1.88 m).   Weight as of 10/20/15: 114.3 kg (252 lb).   Admit date: 02/12/2018 Discharge date: 02/14/2018  Admission Diagnoses:herniated nucleus pulposus L2-3  Discharge Diagnoses: same Active Problems:   HNP (herniated nucleus pulposus), lumbar   Discharged Condition: good  Hospital Course: 62-year-old presented to the hospital underwent decompressive laminectomy discectomy and extension of his fusion to L2-3 above a previous L3-S1. Postoperatively patient did very well recovered in the floor on the floor was ambulating and he had have a catheter placed by urology interoperatively so he will be discharged with a catheter in place. Patient be discharged on oral oxycodone and Flexeril and scheduled follow-up with urology Dr. Mena GoesEskridge next week and me in 2 weeks.  Consults: Significant Diagnostic Studies: Treatments:decompression stabilization procedure L2-3 Discharge Exam: Blood pressure 122/84, pulse 83, temperature 98.5 F (36.9 C), temperature source Oral, resp. rate 18, SpO2 100 %. Strength 5 out of 5 wound clean dry and intact  Disposition: home   Allergies as of 02/14/2018   No Known Allergies     Medication List    TAKE these medications   amLODipine 5 MG tablet Commonly known as:  NORVASC Take 5 mg by mouth daily.   aspirin 325 MG EC tablet Take 1 tablet (325 mg total) by mouth daily.   atorvastatin 40 MG tablet Commonly known as:  LIPITOR Take 1 tablet (40 mg total) by mouth daily at 6 PM.   CIALIS 10 MG tablet Generic drug:  tadalafil   cyclobenzaprine 10 MG tablet Commonly known as:  FLEXERIL Take 1 tablet (10 mg total) by mouth 3 (three) times daily as needed for muscle spasms.   Oxycodone HCl 10 MG Tabs Take 1 tablet (10 mg total) by mouth every 3  (three) hours as needed for severe pain ((score 7 to 10)).   valsartan-hydrochlorothiazide 320-12.5 MG tablet Commonly known as:  DIOVAN-HCT Take 1 tablet by mouth daily.   Vitamin E 400 units Tabs Take 800 Units by mouth daily.      Follow-up Information    Jerilee FieldEskridge, Matthew, MD Follow up.   Specialty:  Urology Why:  5-7 days  Contact information: [address] [place] [address] [phone]           Signed: [place] Hafen P 02/14/2018, 7:42 AM

## 2018-02-14 NOTE — Discharge Instructions (Signed)
Wound Care  Keep the incision clean and dry remove the outer dressing in 2 days, leave the Steri-Strips intact. Wrap with Saran wrap for showers only Do not put any creams, lotions, or ointments on incision. Leave steri-strips on back.  They will fall off by themselves.  Activity Walk each and every day, increasing distance each day. No lifting greater than 5 lbs.  No lifting no bending no twisting no driving or riding a car unless coming back and forth to see me. If provided with back brace, wear when out of bed.  It is not necessary to wear brace in bed. Diet Resume your normal diet.   Return to Work Will be discussed at you follow up appointment.  Call Your Doctor If Any of These Occur Redness, drainage, or swelling at the wound.  Temperature greater than 101 degrees. Severe pain not relieved by pain medication. Incision starts to come apart. Follow Up Appt Call today for appointment in 1-2 weeks [phone]) or for problems.  If you have any hardware placed in your spine, you will need an x-ray before your appointment.             Indwelling Urinary Catheter Care, Adult Take good care of your catheter to keep it working and to prevent problems. How to wear your catheter Attach your catheter to your leg with tape (adhesive tape) or a leg strap. Make sure it is not too tight. If you use tape, remove any bits of tape that are already on the catheter. How to wear a drainage bag You should have:  A large overnight bag.  A small leg bag.  Overnight Bag You may wear the overnight bag at any time. Always keep the bag below the level of your bladder but off the floor. When you sleep, put a clean plastic bag in a wastebasket. Then hang the bag inside the wastebasket. Leg Bag Never wear the leg bag at night. Always wear the leg bag below your knee. Keep the leg bag secure with a leg strap or tape. How to care for your skin  Clean the skin around the catheter at least once  every day.  Shower every day. Do not take baths.  Put creams, lotions, or ointments on your genital area only as told by your doctor.  Do not use powders, sprays, or lotions on your genital area. How to clean your catheter and your skin 1. Wash your hands with soap and water. 2. Wet a washcloth in warm water and gentle (mild) soap. 3. Use the washcloth to clean the skin where the catheter enters your body. Clean downward and wipe away from the catheter in small circles. Do not wipe toward the catheter. 4. Pat the area dry with a clean towel. Make sure to clean off all soap. How to care for your drainage bags Empty your drainage bag when it is ?- full or at least 2-3 times a day. Replace your drainage bag once a month or sooner if it starts to smell bad or look dirty. Do not clean your drainage bag unless told by your doctor. Emptying a drainage bag  Supplies Needed  Rubbing alcohol.  Gauze pad or cotton ball.  Tape or a leg strap.  Steps 1. Wash your hands with soap and water. 2. Separate (detach) the bag from your leg. 3. Hold the bag over the toilet or a clean container. Keep the bag below your hips and bladder. This stops pee (urine) from going back  into the tube. 4. Open the pour spout at the bottom of the bag. 5. Empty the pee into the toilet or container. Do not let the pour spout touch any surface. 6. Put rubbing alcohol on a gauze pad or cotton ball. 7. Use the gauze pad or cotton ball to clean the pour spout. 8. Close the pour spout. 9. Attach the bag to your leg with tape or a leg strap. 10. Wash your hands.  Changing a drainage bag Supplies Needed  Alcohol wipes.  A clean drainage bag.  Adhesive tape or a leg strap.  Steps 1. Wash your hands with soap and water. 2. Separate the dirty bag from your leg. 3. Pinch the rubber catheter with your fingers so that pee does not spill out. 4. Separate the catheter tube from the drainage tube where these tubes  connect (at the connection valve). Do not let the tubes touch any surface. 5. Clean the end of the catheter tube with an alcohol wipe. Use a different alcohol wipe to clean the end of the drainage tube. 6. Connect the catheter tube to the drainage tube of the clean bag. 7. Attach the new bag to the leg with adhesive tape or a leg strap. 8. Wash your hands.  How to prevent infection and other problems  Never pull on your catheter or try to remove it. Pulling can damage tissue in your body.  Always wash your hands before and after touching your catheter.  If a leg strap gets wet, replace it with a dry one.  Drink enough fluids to keep your pee clear or pale yellow, or as told by your doctor.  Do not let the drainage bag or tubing touch the floor.  Wear cotton underwear.  If you are male, wipe from front to back after you poop (have a bowel movement).  Check on the catheter often to make sure it works and the tubing is not twisted. Get help if:  Your pee is cloudy.  Your pee smells unusually bad.  Your pee is not draining into the bag.  Your tube gets clogged.  Your catheter starts to leak.  Your bladder feels full. Get help right away if:  You have redness, swelling, or pain where the catheter enters your body.  You have fluid, pus, or a bad smell coming from the area where the catheter enters your body.  The area where the catheter enters your body feels warm.  You have a fever.  You have pain in your: ? Stomach (abdomen). ? Legs. ? Lower back. ? Bladder.  You see blood fill the catheter.  Your pee is pink or red.  You feel sick to your stomach (nauseous).  You throw up (vomit).  You have chills.  Your catheter gets pulled out. This information is not intended to replace advice given to you by your health care provider. Make sure you discuss any questions you have with your health care provider. Document Released: 12/15/2012 Document Revised:  07/18/2016 Document Reviewed: 02/02/2014 Elsevier Interactive Patient Education  [employer].

## 2018-02-14 NOTE — Anesthesia Postprocedure Evaluation (Signed)
Anesthesia Post Note  Patient: Christian Mcintyre  Procedure(s) Performed: Lumbar 2-3 posterior lumbar interbody fusion/extension addition to Lumbar 3 to Sacral 1 (N/A Back)     Patient location during evaluation: PACU Anesthesia Type: General Level of consciousness: sedated and patient cooperative Pain management: pain level controlled Vital Signs Assessment: post-procedure vital signs reviewed and stable Respiratory status: spontaneous breathing Cardiovascular status: stable Anesthetic complications: no    Last Vitals:  Vitals:   02/14/18 0439 02/14/18 0831  BP: 122/84 135/81  Pulse: 83 91  Resp: 18 18  Temp: 36.9 C 36.5 C  SpO2: 100% 99%    Last Pain:  Vitals:   02/14/18 0831  TempSrc: Oral  PainSc:                  Christian Mcintyre

## 2018-02-14 NOTE — Progress Notes (Signed)
Patient places themselves on CPAP and will call if any assistance needed.  

## 2018-02-14 NOTE — Progress Notes (Signed)
Physical Therapy Treatment Patient Details Name: Christian Mcintyre MRN: [id number] DOB: 1956-07-10 Today's Date: 02/14/2018    History of Present Illness Pt is a 62 y/o male who presents s/p L2-L3 PLIF on 02/12/18. PMH significant for CVA, HTN.    PT Comments    Session focused on reviewing precautions, ambulation, high level balance activities, and negotiating steps to prepare for discharge home. Patient ambulating 450 feet without an assistive device with no loss of balance noted. Able to negotiate 10 steps with one railing with supervision. Scoring a 19/24 on the Dynamic Gait Index, indicating patient does have high level balance deficits, but is not at high risk for falls. Patient met his physical therapy goals during his inpatient stay. No further acute PT needs. PT signing off.    Follow Up Recommendations  No PT follow up;Supervision - Intermittent     Equipment Recommendations  None recommended by PT    Recommendations for Other Services       Precautions / Restrictions Precautions Precautions: Fall;Back Precaution Booklet Issued: Yes (comment) Precaution Comments: Recalled 3/3 precautions; has drain Required Braces or Orthoses: Spinal Brace Spinal Brace: Lumbar corset;Applied in sitting position Restrictions Weight Bearing Restrictions: No    Mobility  Bed Mobility               General bed mobility comments: Pt sitting up on EOB when PT arrived.   Transfers Overall transfer level: Modified independent Equipment used: None Transfers: Sit to/from Stand Sit to Stand: Modified independent (Device/Increase time)            Ambulation/Gait Ambulation/Gait assistance: Modified independent (Device/Increase time) Gait Distance (Feet): 450 Feet Assistive device: None Gait Pattern/deviations: Step-through pattern;Decreased dorsiflexion - right;Decreased dorsiflexion - left     General Gait Details: Patient demonstrates good gait speed and posture  throughout   Stairs Stairs: Yes Stairs assistance: Supervision Stair Management: Step to pattern;Forwards;One rail Left Number of Stairs: 10 General stair comments: Patient completed full flight to simulate home set up. Supervision for safety with step by step pattern.   Wheelchair Mobility    Modified Rankin (Stroke Patients Only)       Balance Overall balance assessment: Needs assistance Sitting-balance support: Feet supported;No upper extremity supported Sitting balance-Leahy Scale: Normal     Standing balance support: No upper extremity supported;During functional activity Standing balance-Leahy Scale: Good                   Standardized Balance Assessment Standardized Balance Assessment : Dynamic Gait Index   Dynamic Gait Index Level Surface: Mild Impairment Change in Gait Speed: Mild Impairment Gait with Horizontal Head Turns: Normal Gait with Vertical Head Turns: Normal Gait and Pivot Turn: Normal Step Over Obstacle: Mild Impairment Step Around Obstacles: Normal Steps: Moderate Impairment Total Score: 19      Cognition Arousal/Alertness: Awake/alert Behavior During Therapy: WFL for tasks assessed/performed Overall Cognitive Status: Within Functional Limits for tasks assessed                                        Exercises      General Comments        Pertinent Vitals/Pain Pain Assessment: Faces Faces Pain Scale: Hurts a little bit Pain Location: Incision site. Describes some numbness in the right thigh and "ball of foot and big toe" Pain Descriptors / Indicators: Operative site guarding;Aching Pain Intervention(s): Monitored during session  Home Living                      Prior Function            PT Goals (current goals can now be found in the care plan section) Acute Rehab PT Goals Patient Stated Goal: Home at d/c PT Goal Formulation: With patient/family Time For Goal Achievement:  02/20/18 Potential to Achieve Goals: Good Progress towards PT goals: Goals met/education completed, patient discharged from PT    Frequency    Min 5X/week      PT Plan Other (comment)(d/c therapies)    Co-evaluation              AM-PAC PT "6 Clicks" Daily Activity  Outcome Measure  Difficulty turning over in bed (including adjusting bedclothes, sheets and blankets)?: None Difficulty moving from lying on back to sitting on the side of the bed? : None Difficulty sitting down on and standing up from a chair with arms (e.g., wheelchair, bedside commode, etc,.)?: None Help needed moving to and from a bed to chair (including a wheelchair)?: None Help needed walking in hospital room?: None Help needed climbing 3-5 steps with a railing? : A Little 6 Click Score: 23    End of Session Equipment Utilized During Treatment: Gait belt;Back brace Activity Tolerance: Patient tolerated treatment well Patient left: with call bell/phone within reach;Other (comment)(seated EOB) Nurse Communication: Mobility status PT Visit Diagnosis: Unsteadiness on feet (R26.81);Pain;Difficulty in walking, not elsewhere classified (R26.2) Pain - part of body: (back)     Time: 5465-0354 PT Time Calculation (min) (ACUTE ONLY): 15 min  Charges:  $Therapeutic Activity: 8-22 mins                    G Codes:      Ellamae Sia, PT, DPT Acute Rehabilitation Services  Pager: [place] 02/14/2018, 8:25 AM

## 2018-04-17 ENCOUNTER — Other Ambulatory Visit: Payer: Self-pay

## 2018-04-17 ENCOUNTER — Encounter: Payer: Self-pay | Admitting: Physical Therapy

## 2018-04-17 ENCOUNTER — Ambulatory Visit: Payer: Managed Care, Other (non HMO) | Attending: Neurosurgery | Admitting: Physical Therapy

## 2018-04-17 DIAGNOSIS — R29818 Other symptoms and signs involving the nervous system: Secondary | ICD-10-CM | POA: Diagnosis present

## 2018-04-17 DIAGNOSIS — R2689 Other abnormalities of gait and mobility: Secondary | ICD-10-CM | POA: Diagnosis present

## 2018-04-17 DIAGNOSIS — M5441 Lumbago with sciatica, right side: Secondary | ICD-10-CM | POA: Diagnosis present

## 2018-04-17 DIAGNOSIS — G8929 Other chronic pain: Secondary | ICD-10-CM | POA: Insufficient documentation

## 2018-04-17 DIAGNOSIS — R252 Cramp and spasm: Secondary | ICD-10-CM | POA: Diagnosis present

## 2018-04-17 DIAGNOSIS — M6281 Muscle weakness (generalized): Secondary | ICD-10-CM | POA: Insufficient documentation

## 2018-04-17 NOTE — Therapy (Addendum)
[facility] [place] [address]  [address] [place], [place], [address] Phone: [phone]   Fax:  [phone]  Physical Therapy Evaluation  Patient Details  Name: Christian Mcintyre MRN: [id number] Date of Birth: [birth year] Referring Provider: Garlon HatchetGary Cram Jr., MD   Encounter Date: 04/17/2018  PT End of Session - 04/17/18 1758    Visit Number  1    Number of Visits  12    Date for PT Re-Evaluation  05/30/18    PT Start Time  1615    PT Stop Time  1703    PT Time Calculation (min)  48 min    Activity Tolerance  Patient tolerated treatment well    Behavior During Therapy  [facility] for tasks assessed/performed       Past Medical History:  Diagnosis Date  . Hypertension   . Sleep apnea    cpap  . Stroke [facility](HCC) 2016   TIA    Past Surgical History:  Procedure Laterality Date  . BACK SURGERY     x2  . COLONOSCOPY    . EP IMPLANTABLE DEVICE N/A 05/31/2015   Procedure: Loop Recorder Insertion;  Surgeon: Hillis RangeJames Allred, MD;  Location: [facility] INVASIVE CV LAB;  Service: Cardiovascular;  Laterality: N/A;  . HERNIA REPAIR     umbical  . lumbar L4/5 fusion in 2008 Bilateral 2008  . TEE WITHOUT CARDIOVERSION N/A 05/16/2015   Procedure: TRANSESOPHAGEAL ECHOCARDIOGRAM (TEE);  Surgeon: Vesta MixerPhilip J Nahser, MD;  [facility];  Service: Cardiovascular;  Laterality: N/A;    There were no vitals filed for this visit.   Subjective Assessment - 04/17/18 1617    Subjective  Pt states that his legs and his back do not feel as strong as they did before the surgery on 02/12/2018.  He mentions that he has difficulty with getting down to the dog bowl in his house, and compensates his movement patterns to be able to perform. He also states that he has an area of numbness on the plantar surface of the ball of his R foot. He has very mild constant pain, but altered movement patterns from years of very intense pain. Received PT in the hospital but has not had any  since, and was not given any exercises to continue at home. 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 Pt has 4 more weeks on precautions, and states he is up to being able to lift about 15 lbs at a time now.     Pertinent History  L2-L3 Lumbar fusion on 02/12/2018    Limitations  House hold activities   Yard work   Patient Stated Goals  "get my legs and back stronger"    Currently in Pain?  Yes    Pain Score  1     Pain Location  Back    Pain Orientation  Mid    Pain Descriptors / Indicators  Dull;Aching    Pain Type  Surgical pain    Pain Onset  More than a month ago    Pain Frequency  Constant    Aggravating Factors   Bending forward    Pain Relieving Factors  Standing, trying to change positions     Effect of Pain on Daily Activities  Compensates while performing activities that required him to bend over.          [facility] - 04/17/18 0001  Assessment   Medical Diagnosis  Lumbago of lumbar region with sciatica    Referring Provider  Garlon HatchetGary Cram Jr., MD    Onset Date/Surgical Date  02/12/18    Next MD Visit  Nov 2019    Prior Therapy  Yes      Precautions   Precautions  Back    Precaution Comments  No BLT for 4 more weeks (pt is currently 8 weeks out from surgery)      Balance Screen   Has the patient fallen in the past 6 months  Yes    How many times?  7   1 since surgery, cites "R leg would give out" before surgery   Has the patient had a decrease in activity level because of a fear of falling?   No   Not since the surgery   Is the patient reluctant to leave their home because of a fear of falling?   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     Leisure  Prior [place] work, Training and development officerwoodworking, skiing, golfing      Observation/Other Assessments   Focus on Therapeutic Outcomes (FOTO)   Intake - status 59% (limitation 41%); predicted - status - 64% (limitation 36%)      Posture/Postural Control   Posture/Postural Control  Postural limitations    Postural Limitations  Weight shift left;Decreased lumbar lordosis;Flexed trunk    Posture Comments  In standing pt is more reliant on L side for WB, and mentioned that he believes that this is a result of altered movement patterns that he adopted over years secondary to ongoing back pain      ROM / Strength   AROM / PROM / Strength  Strength      Strength   Strength Assessment Site  Hip;Knee    Right/Left Hip  Right;Left    Right Hip Flexion  4/5    Right Hip Extension  4-/5    Right Hip External Rotation   4+/5    Right Hip Internal Rotation  4+/5    Right Hip ABduction  3+/5    Right Hip ADduction  4/5    Left Hip Flexion  4/5    Left Hip Extension  4/5    Left Hip External Rotation  4+/5    Left Hip Internal Rotation  4+/5    Left Hip ABduction  4+/5    Left Hip ADduction  4/5    Right/Left Knee  Right;Left    Right Knee Flexion  5/5    Right Knee Extension  5/5    Left Knee Flexion  5/5    Left Knee Extension  5/5      Flexibility   Soft Tissue Assessment /Muscle Length  yes    Hamstrings  Mod tightness bilaterally with quad cramping/spasming when pt attempted to actively perform a SLR     Quadriceps  Mod tightness bil with cramping in the hamstrings during assessment      Palpation  Palpation comment  No TTP noted      Special Tests    Special Tests  Hip Special Tests    Hip Special Tests   Ober's Test;Ely's Test      Ober's Test   Findings  Negative    Side  Right;Left    Comments  Increased restriction in the hip flexors noticed during test       Ely's Test   Findings  Positive     Side  Left;Right      Ambulation/Gait   Ambulation/Gait  Yes    Ambulation/Gait Assistance  7: Independent    Ambulation Distance (Feet)  60 Feet    Assistive device  None    Gait Pattern  Decreased weight shift to right;Decreased step length - left;Decreased step length - right    Ambulation Surface  Level;Indoor                Objective measurements completed on examination: See above findings.      [facility] Adult PT Treatment/Exercise - 04/17/18 0001      Exercises   Exercises  Knee/Hip;Lumbar      Lumbar Exercises: Stretches   Passive Hamstring Stretch  Right;3 reps;30 seconds    Hip Flexor Stretch  Right;3 reps;30 seconds      Knee/Hip Exercises: Standing   Other Standing Knee Exercises  Lateral band walking with Red TB around knees. 10 steps in each direction with VC for correct eccentric control of trailing limb              PT Education - 04/17/18 1757    Education Details  Pt educated on results of examination, POC, and HEP exercises    Person(s) Educated  Patient    Methods  Explanation       PT Short Term Goals - 04/17/18 1810      PT SHORT TERM GOAL #1   Title  Pt will be independent with initial HEP    Status  New    Target Date  05/08/18        PT Long Term Goals - 04/17/18 1810      PT LONG TERM GOAL #1   Title  Pt will be independent with advanced HEP    Status  New    Target Date  05/29/18      PT LONG TERM GOAL #2   Title  Pt will have 4/5 bilateral hip strength to improve tolerance to functional activities     Status  New    Target Date  05/29/18      PT LONG TERM GOAL #3   Title  Pt will demonstrate and verbalize improved postural awareness and positioning to improve joint biomechanics     Status  New    Target Date  05/29/18      PT LONG TERM GOAL #4   Title  Pt will report ability to perform activities at home including feeding the dogs with no increase in pain or compensation     Status  New    Target Date  05/29/18       PT LONG TERM GOAL #5   Title  Pt will demonstrate normal gait pattern with adequate step length and weight shift to R to decrease altered movement patterns and decrease risk of future injury     Status  New    Target Date  05/29/18             Plan - 04/17/18 1801  Clinical Impression Statement  Pt is a 62 y/o M who presents to OP PT s/p L2-L3 lumbar fusion on 02/12/2018. Examination revealed decreased bil hip strength, postural abnormalities, and gait abnormalities secondary to habitual altered movement patterns from chronic back pain. These impairments are limiting pt's ability to perform recreational activities and IADLs including yard work, vacuuming, and carrying items such as groceries. His prognosis is positively impacted by his low pain levels and prior positive response to therapy, but is negatively impacted by the chronic nature of his condition. He will benefit from physical therapy at this time to address the aforementioned impairments and progress toward his functional goals.     History and Personal Factors relevant to plan of care:  L2-L3 Lumbar fusion on 02/12/2018    Clinical Presentation  Stable    Clinical Decision Making  Low    Rehab Potential  Good    PT Frequency  2x / week    PT Duration  6 weeks    PT Treatment/Interventions  ADLs/Self Care Home Management;Cryotherapy;Electrical Stimulation;Iontophoresis 4mg /ml Dexamethasone;Moist Heat;Gait training;Stair training;Functional mobility training;Therapeutic activities;Therapeutic exercise;Balance training;Neuromuscular re-education;Patient/family education;Manual techniques;Passive range of motion;Dry needling;Taping;Vasopneumatic Device    Consulted and Agree with Plan of Care  Patient       Patient will benefit from skilled therapeutic intervention in order to improve the following deficits and impairments:  Abnormal gait, Decreased activity tolerance, Decreased strength, Increased muscle spasms, Impaired  flexibility, Impaired sensation, Impaired vision/preception, Improper body mechanics, Postural dysfunction, Pain  Visit Diagnosis: Chronic midline low back pain with right-sided sciatica  Other abnormalities of gait and mobility  Cramp and spasm  Other symptoms and signs involving the nervous system  Muscle weakness (generalized)     Problem List Patient Active Problem List   Diagnosis Date Noted  . HNP (herniated nucleus pulposus), lumbar 02/12/2018  . Cryptogenic stroke (HCC) 07/11/2015  . TIA (transient ischemic attack) 04/28/2015  . Essential hypertension 04/28/2015  . Sleep apnea 04/28/2015  . Chronic back pain 04/28/2015  . Spinal stenosis of lumbar region 03/16/2015    Mikal Plane, SPT  04/17/2018, 6:36 PM  [facility] [address]  [address] [place], [place], [address] Phone: [phone]   Fax:  [phone]  Name: Christian Mcintyre MRN: [id number] Date of Birth: 15-Feb-1956

## 2018-04-22 ENCOUNTER — Ambulatory Visit: Payer: Managed Care, Other (non HMO) | Admitting: Physical Therapy

## 2018-04-22 DIAGNOSIS — M5441 Lumbago with sciatica, right side: Principal | ICD-10-CM

## 2018-04-22 DIAGNOSIS — G8929 Other chronic pain: Secondary | ICD-10-CM

## 2018-04-22 DIAGNOSIS — M6281 Muscle weakness (generalized): Secondary | ICD-10-CM

## 2018-04-22 DIAGNOSIS — R252 Cramp and spasm: Secondary | ICD-10-CM

## 2018-04-22 DIAGNOSIS — R2689 Other abnormalities of gait and mobility: Secondary | ICD-10-CM

## 2018-04-22 NOTE — Therapy (Signed)
[facility] [place] [address]  [address] [place], [place], [address] Phone: [phone]   Fax:  [phone]  Physical Therapy Treatment  Patient Details  Name: Christian Mcintyre MRN: [id number] Date of Birth: [birth year] Referring Provider: Garlon HatchetGary Cram Jr., MD   Encounter Date: 04/22/2018  PT End of Session - 04/22/18 1619    Visit Number  2    Number of Visits  12    Date for PT Re-Evaluation  05/30/18    PT Start Time  1617    PT Stop Time  1655    PT Time Calculation (min)  38 min    Activity Tolerance  Patient tolerated treatment well    Behavior During Therapy  [facility] for tasks assessed/performed       Past Medical History:  Diagnosis Date  . Hypertension   . Sleep apnea    cpap  . Stroke [facility](HCC) 2016   TIA    Past Surgical History:  Procedure Laterality Date  . BACK SURGERY     x2  . COLONOSCOPY    . EP IMPLANTABLE DEVICE N/A 05/31/2015   Procedure: Loop Recorder Insertion;  Surgeon: Hillis RangeJames Allred, MD;  [facility];  Service: Cardiovascular;  Laterality: N/A;  . HERNIA REPAIR     umbical  . lumbar L4/5 fusion in 2008 Bilateral 2008  . TEE WITHOUT CARDIOVERSION N/A 05/16/2015   Procedure: TRANSESOPHAGEAL ECHOCARDIOGRAM (TEE);  Surgeon: Vesta MixerPhilip J Nahser, MD;  [facility];  Service: Cardiovascular;  Laterality: N/A;    There were no vitals filed for this visit.  Subjective Assessment - 04/22/18 1621    Subjective  Pt reports no new changes since last visit. He is anxious to be free of back restrictions and return to golf.    Currently in Pain?  No/denies    Pain Score  0-No pain         [facility] PT Assessment - 04/22/18 0001      Assessment   Medical Diagnosis  Lumbago of lumbar region with sciatica    Referring Provider  Garlon HatchetGary Cram Jr., MD    Onset Date/Surgical Date  02/12/18    Next MD Visit  Nov 2019       [facility] Treatment/Exercise - 04/22/18 0001      Lumbar Exercises:  Stretches   Passive Hamstring Stretch  Right;Left;3 reps;30 seconds    Hip Flexor Stretch  Right;Left;3 reps;30 seconds   supine with leg off table   Hip Flexor Stretch Limitations  seated x 1 rep each leg.       Lumbar Exercises: Aerobic   Tread Mill  2.8 mph x 4 min       Lumbar Exercises: Standing   Other Standing Lumbar Exercises  tandem stance x 30 sec x 2 reps each side, with horiz head turns.  repeated with red band ext x 10 x 2 sets     Lumbar Exercises: Supine   Ab Set  5 seconds;5 reps - cues for proper form    Clam  20 reps   each side with ab set   Bent Knee Raise  15 reps   with ab set            PT Education - 04/22/18 1651    Education Details  HEP    Person(s) Educated  Patient    Methods  Explanation;Handout;Verbal cues;Demonstration    Comprehension  Verbalized understanding;Returned demonstration  PT Short Term Goals - 04/17/18 1810      PT SHORT TERM GOAL #1   Title  Pt will be independent with initial HEP    Status  New    Target Date  05/08/18        PT Long Term Goals - 04/22/18 1706      PT LONG TERM GOAL #1   Title  Pt will be independent with advanced HEP -05/29/18    Status  On-going      PT LONG TERM GOAL #2   Title  Pt will have 4/5 bilateral hip strength to improve tolerance to functional activities -05/29/18    Status  On-going      PT LONG TERM GOAL #3   Title  Pt will demonstrate and verbalize improved postural awareness and positioning to improve joint biomechanics -05/29/18    Status  On-going      PT LONG TERM GOAL #4   Title  Pt will report ability to perform activities at home including feeding the dogs with no increase in pain or compensation -05/29/18    Status  On-going      PT LONG TERM GOAL #5   Title  Pt will demonstrate normal gait pattern with adequate step length and weight shift to R to decrease altered movement patterns and decrease risk of future injury -05/29/18    Status  On-going             Plan - 04/22/18 1702    Clinical Impression Statement  Reviewed current HEP and revisited old concepts from previous episodes of care, ie: abdominal bracing exercises, balance.  Pt tolerated all exercises well without increase in symptoms (without back brace donned).  Pt is progressing towards established goals.     Rehab Potential  Good    PT Frequency  2x / week    PT Duration  6 weeks    PT Treatment/Interventions  ADLs/Self Care Home Management;Cryotherapy;Electrical Stimulation;Iontophoresis 4mg /ml Dexamethasone;Moist Heat;Gait training;Stair training;Functional mobility training;Therapeutic activities;Therapeutic exercise;Balance training;Neuromuscular re-education;Patient/family education;Manual techniques;Passive range of motion;Dry needling;Taping;Vasopneumatic Device    PT Next Visit Plan  continue spinal stabilization and balance exercises.      Consulted and Agree with Plan of Care  Patient       Patient will benefit from skilled therapeutic intervention in order to improve the following deficits and impairments:  Abnormal gait, Decreased activity tolerance, Decreased strength, Increased muscle spasms, Impaired flexibility, Impaired sensation, Impaired vision/preception, Improper body mechanics, Postural dysfunction, Pain  Visit Diagnosis: Chronic midline low back pain with right-sided sciatica  Other abnormalities of gait and mobility  Muscle weakness (generalized)  Cramp and spasm     Problem List Patient Active Problem List   Diagnosis Date Noted  . HNP (herniated nucleus pulposus), lumbar 02/12/2018  . Cryptogenic stroke (HCC) 07/11/2015  . TIA (transient ischemic attack) 04/28/2015  . Essential hypertension 04/28/2015  . Sleep apnea 04/28/2015  . Chronic back pain 04/28/2015  . Spinal stenosis of lumbar region 03/16/2015   Christian Mcintyre, PTA 04/22/18 5:07 PM  [facility] [place] [address]  [address] [place], [place], [address] Phone: [phone]   Fax:  [phone]  Name: Christian Mcintyre MRN: [id number] Date of Birth: 1956/05/31

## 2018-04-22 NOTE — Patient Instructions (Signed)
  Abdominal Bracing With Pelvic Floor (Hook-Lying)   With neutral spine, tighten pelvic floor and abdominals. Hold 10 seconds. Repeat __10_ times. Do _several__ times a day. Can be performed in sitting, standing. .  Knee to Chest: Transverse Plane Stability   Bring one knee up, then return. Be sure pelvis does not roll side to side. Keep pelvis still. Lift knee __10_ times each leg. Restabilize pelvis. Repeat with other leg. Do _1-2__ sets, _1-2__ times per day. Bridging    Slowly raise buttocks from floor, keeping stomach tight. Repeat __10__ times per set. Do __1-2__ sets per session. Do __3__ sessions per week.  Feet Heel-Toe "Tandem", Varied Arm Positions - Eyes Open    With eyes open, right foot directly in front of the other, arms out, look straight ahead at a stationary object. Hold __30__ seconds. Repeat _2___ times per session.     [facility] [place] [address] [place], [place] [address]  [phone] (office) [phone] (fax)

## 2018-04-24 ENCOUNTER — Ambulatory Visit: Payer: Managed Care, Other (non HMO)

## 2018-04-24 DIAGNOSIS — M6281 Muscle weakness (generalized): Secondary | ICD-10-CM

## 2018-04-24 DIAGNOSIS — R252 Cramp and spasm: Secondary | ICD-10-CM

## 2018-04-24 DIAGNOSIS — M5441 Lumbago with sciatica, right side: Principal | ICD-10-CM

## 2018-04-24 DIAGNOSIS — R29818 Other symptoms and signs involving the nervous system: Secondary | ICD-10-CM

## 2018-04-24 DIAGNOSIS — R2689 Other abnormalities of gait and mobility: Secondary | ICD-10-CM

## 2018-04-24 DIAGNOSIS — G8929 Other chronic pain: Secondary | ICD-10-CM

## 2018-04-24 NOTE — Therapy (Signed)
[facility] [address]  [address] [place], [place], [address] Phone: [phone]   Fax:  [phone]  Physical Therapy Treatment  Patient Details  Name: Christian Mcintyre MRN: [id number] Date of Birth: [birth year] Referring Provider: Garlon HatchetGary Cram Jr., MD   Encounter Date: 04/24/2018  PT End of Session - 04/24/18 0823    Visit Number  3    Number of Visits  12    Date for PT Re-Evaluation  05/30/18    PT Start Time  0818    PT Stop Time  0900    PT Time Calculation (min)  42 min    Activity Tolerance  Patient tolerated treatment well    Behavior During Therapy  [facility] for tasks assessed/performed       Past Medical History:  Diagnosis Date  . Hypertension   . Sleep apnea    cpap  . Stroke [facility](HCC) 2016   TIA    Past Surgical History:  Procedure Laterality Date  . BACK SURGERY     x2  . COLONOSCOPY    . EP IMPLANTABLE DEVICE N/A 05/31/2015   Procedure: Loop Recorder Insertion;  Surgeon: Hillis RangeJames Allred, MD;  [facility];  Service: Cardiovascular;  Laterality: N/A;  . HERNIA REPAIR     umbical  . lumbar L4/5 fusion in 2008 Bilateral 2008  . TEE WITHOUT CARDIOVERSION N/A 05/16/2015   Procedure: TRANSESOPHAGEAL ECHOCARDIOGRAM (TEE);  Surgeon: Vesta MixerPhilip J Nahser, MD;  [facility];  Service: Cardiovascular;  Laterality: N/A;    There were no vitals filed for this visit.  Subjective Assessment - 04/24/18 0822    Subjective  Pt. doing well today reporting no pain today.  Pt. noting he can standing for ~ 1 hour at standing desk.      Pertinent History  L2-L3 Lumbar fusion on 02/12/2018    Patient Stated Goals  "get my legs and back stronger"    Currently in Pain?  No/denies    Pain Score  0-No pain    Multiple Pain Sites  No                       [facility] Adult PT Treatment/Exercise - 04/24/18 0830      Lumbar Exercises: Aerobic   Nustep  Lvl 4, 6 min      Lumbar Exercises: Machines for  Strengthening   Other Lumbar Machine Exercise  B single arm Low Row 10# x 10 reps each wya    Cues provided for neutral pelvis and scap. retraction      Lumbar Exercises: Standing   Functional Squats  10 reps;3 seconds    Row  Both;15 reps;Theraband   5" hold    Theraband Level (Row)  Level 2 (Red)    Row Limitations  with cues for scap. retraction     Shoulder Extension  Both;15 reps;Strengthening;Theraband   3" hold    Theraband Level (Shoulder Extension)  Level 2 (Red)    Shoulder Extension Limitations  With cues for scap. retraction       Lumbar Exercises: Seated   Other Seated Lumbar Exercises  B pallof press seated on red p-ball x 15 reps each way    in partial narrow stance - cues provided for abdom. bracing      Lumbar Exercises: Supine   Ab Set  5 seconds;15 reps    AB Set Limitations  with adduction ball squeeze     Bent  Knee Raise  10 reps    Bent Knee Raise Limitations  with yellow TB at knees     Bridge with clamshell  10 reps;3 seconds    Bridge with [employer] Limitations  With isometric hip abd/ER into yellow TB at knees                PT Short Term Goals - 04/24/18 0824      PT SHORT TERM GOAL #1   Title  Pt will be independent with initial HEP    Status  On-going        PT Long Term Goals - 04/22/18 1706      PT LONG TERM GOAL #1   Title  Pt will be independent with advanced HEP -05/29/18    Status  On-going      PT LONG TERM GOAL #2   Title  Pt will have 4/5 bilateral hip strength to improve tolerance to functional activities -05/29/18    Status  On-going      PT LONG TERM GOAL #3   Title  Pt will demonstrate and verbalize improved postural awareness and positioning to improve joint biomechanics -05/29/18    Status  On-going      PT LONG TERM GOAL #4   Title  Pt will report ability to perform activities at home including feeding the dogs with no increase in pain or compensation -05/29/18    Status  On-going      PT LONG TERM GOAL #5    Title  Pt will demonstrate normal gait pattern with adequate step length and weight shift to R to decrease altered movement patterns and decrease risk of future injury -05/29/18    Status  On-going            Plan - 04/24/18 0824    Clinical Impression Statement  Pt. doing well today noting no back pain.  Remained pain free throughout gentle lumbopelvic and scapular strengthening activities.  Tolerated addition of band resisted [employer], and progression of brace march well today.  Ended visit pain free thus modalities deferred.      PT Treatment/Interventions  ADLs/Self Care Home Management;Cryotherapy;Electrical Stimulation;Iontophoresis 4mg /ml Dexamethasone;Moist Heat;Gait training;Stair training;Functional mobility training;Therapeutic activities;Therapeutic exercise;Balance training;Neuromuscular re-education;Patient/family education;Manual techniques;Passive range of motion;Dry needling;Taping;Vasopneumatic Device    Consulted and Agree with Plan of Care  Patient       Patient will benefit from skilled therapeutic intervention in order to improve the following deficits and impairments:  Abnormal gait, Decreased activity tolerance, Decreased strength, Increased muscle spasms, Impaired flexibility, Impaired sensation, Impaired vision/preception, Improper body mechanics, Postural dysfunction, Pain  Visit Diagnosis: Chronic midline low back pain with right-sided sciatica  Other abnormalities of gait and mobility  Muscle weakness (generalized)  Cramp and spasm  Other symptoms and signs involving the nervous system     Problem List Patient Active Problem List   Diagnosis Date Noted  . HNP (herniated nucleus pulposus), lumbar 02/12/2018  . Cryptogenic stroke (HCC) 07/11/2015  . TIA (transient ischemic attack) 04/28/2015  . Essential hypertension 04/28/2015  . Sleep apnea 04/28/2015  . Chronic back pain 04/28/2015  . Spinal stenosis of lumbar region 03/16/2015    Kermit Balo, PTA 04/24/18 9:14 AM   [facility] [address]  [address] [place], [place], [address] Phone: [phone]   Fax:  [phone]  Name: Christian Mcintyre MRN: [id number] Date of Birth: 12/14/1955

## 2018-04-29 ENCOUNTER — Ambulatory Visit: Payer: Managed Care, Other (non HMO)

## 2018-04-29 DIAGNOSIS — M6281 Muscle weakness (generalized): Secondary | ICD-10-CM

## 2018-04-29 DIAGNOSIS — M5441 Lumbago with sciatica, right side: Principal | ICD-10-CM

## 2018-04-29 DIAGNOSIS — R252 Cramp and spasm: Secondary | ICD-10-CM

## 2018-04-29 DIAGNOSIS — R29818 Other symptoms and signs involving the nervous system: Secondary | ICD-10-CM

## 2018-04-29 DIAGNOSIS — R2689 Other abnormalities of gait and mobility: Secondary | ICD-10-CM

## 2018-04-29 DIAGNOSIS — G8929 Other chronic pain: Secondary | ICD-10-CM

## 2018-04-29 NOTE — Therapy (Signed)
[facility] [address]  [address] [place], [place], [address] Phone: [phone]   Fax:  [phone]  Physical Therapy Treatment  Patient Details  Name: Christian Mcintyre MRN: [id number] Date of Birth: [birth year] Referring Provider: Garlon Hatchet., MD   Encounter Date: 04/29/2018  PT End of Session - 04/29/18 1615    Visit Number  4    Number of Visits  12    Date for PT Re-Evaluation  05/30/18    PT Start Time  1615    PT Stop Time  1708    PT Time Calculation (min)  53 min    Activity Tolerance  Patient tolerated treatment well    Behavior During Therapy  [facility] for tasks assessed/performed       Past Medical History:  Diagnosis Date  . Hypertension   . Sleep apnea    cpap  . Stroke [facility]) 2016   TIA    Past Surgical History:  Procedure Laterality Date  . BACK SURGERY     x2  . COLONOSCOPY    . EP IMPLANTABLE DEVICE N/A 05/31/2015   Procedure: Loop Recorder Insertion;  Surgeon: Hillis Range, MD;  [facility];  Service: Cardiovascular;  Laterality: N/A;  . HERNIA REPAIR     umbical  . lumbar L4/5 fusion in 2008 Bilateral 2008  . TEE WITHOUT CARDIOVERSION N/A 05/16/2015   Procedure: TRANSESOPHAGEAL ECHOCARDIOGRAM (TEE);  Surgeon: Vesta Mixer, MD;  Location[facility];  Service: Cardiovascular;  Laterality: N/A;    There were no vitals filed for this visit.  Subjective Assessment - 04/29/18 1618    Subjective  Pt. doing well today noting he was able to drive to [place] without significant back pain over weekend.      Pertinent History  L2-L3 Lumbar fusion on 02/12/2018    Patient Stated Goals  "get my legs and back stronger"    Currently in Pain?  No/denies    Pain Score  0-No pain    Multiple Pain Sites  No                       [facility] Adult PT Treatment/Exercise - 04/29/18 1629      Lumbar Exercises: Aerobic   Nustep  Lvl 5, 6 min      Lumbar Exercises: Machines for  Strengthening   Other Lumbar Machine Exercise  B machine pallof press 5# x 10 reps       Lumbar Exercises: Seated   Long Arc Quad on Chair  Both;10 reps;Strengthening    LAQ on Chair Limitations  seated on red p-ball     Hip Flexion on Ball  Right;Left;10 reps    Hip Flexion on Ball Limitations  seated on red p-ball     Other Seated Lumbar Exercises  B pallof press seated on green p-ball x 15 reps each way       Lumbar Exercises: Supine   Ab Set  5 seconds;15 reps    AB Set Limitations  with adduction ball squeeze     Bent Knee Raise  10 reps    Bent Knee Raise Limitations  with red TB at knees     Bridge with clamshell  15 reps;3 seconds    Bridge with [employer] Limitations  With isometric hip abd/ER into red TB at knees       Lumbar Exercises: Sidelying   Clam  Right;Left;10 reps;3 seconds  Clam Limitations  B sidelhying with red TB at knees     Hip Abduction  Right;Left;10 reps;3 seconds    Hip Abduction Limitations  B    Other Sidelying Lumbar Exercises  B sidelying hip adduction x 10 reps                PT Short Term Goals - 04/24/18 1610      PT SHORT TERM GOAL #1   Title  Pt will be independent with initial HEP    Status  On-going        PT Long Term Goals - 04/22/18 1706      PT LONG TERM GOAL #1   Title  Pt will be independent with advanced HEP -05/29/18    Status  On-going      PT LONG TERM GOAL #2   Title  Pt will have 4/5 bilateral hip strength to improve tolerance to functional activities -05/29/18    Status  On-going      PT LONG TERM GOAL #3   Title  Pt will demonstrate and verbalize improved postural awareness and positioning to improve joint biomechanics -05/29/18    Status  On-going      PT LONG TERM GOAL #4   Title  Pt will report ability to perform activities at home including feeding the dogs with no increase in pain or compensation -05/29/18    Status  On-going      PT LONG TERM GOAL #5   Title  Pt will demonstrate normal gait  pattern with adequate step length and weight shift to R to decrease altered movement patterns and decrease risk of future injury -05/29/18    Status  On-going            Plan - 04/29/18 1621    Clinical Impression Statement  Christian Mcintyre doing well today able to drive to [facility] to visit mother without LBP.  Tolerated progression of lumbopelvic strengthening activities seated on p-ball well today.  Did have mild lower back soreness following hip flexion isometrics to end visit thus applied trial of E-stim/moist heat to lumbar spine to decrease soreness.  Will continue to progress toward goals.      PT Treatment/Interventions  ADLs/Self Care Home Management;Cryotherapy;Electrical Stimulation;Iontophoresis 4mg /ml Dexamethasone;Moist Heat;Gait training;Stair training;Functional mobility training;Therapeutic activities;Therapeutic exercise;Balance training;Neuromuscular re-education;Patient/family education;Manual techniques;Passive range of motion;Dry needling;Taping;Vasopneumatic Device    Consulted and Agree with Plan of Care  Patient       Patient will benefit from skilled therapeutic intervention in order to improve the following deficits and impairments:  Abnormal gait, Decreased activity tolerance, Decreased strength, Increased muscle spasms, Impaired flexibility, Impaired sensation, Impaired vision/preception, Improper body mechanics, Postural dysfunction, Pain  Visit Diagnosis: Chronic midline low back pain with right-sided sciatica  Other abnormalities of gait and mobility  Cramp and spasm  Other symptoms and signs involving the nervous system  Muscle weakness (generalized)     Problem List Patient Active Problem List   Diagnosis Date Noted  . HNP (herniated nucleus pulposus), lumbar 02/12/2018  . Cryptogenic stroke (HCC) 07/11/2015  . TIA (transient ischemic attack) 04/28/2015  . Essential hypertension 04/28/2015  . Sleep apnea 04/28/2015  . Chronic back pain 04/28/2015  . Spinal  stenosis of lumbar region 03/16/2015    Kermit Balo, PTA 04/29/18 5:25 PM   [facility] [address]  [address] [place], [place], [address] Phone: [phone]   Fax:  [phone]  Name: Christian Mcintyre MRN: [id number] Date  of Birth: 20-Apr-1956

## 2018-05-07 ENCOUNTER — Ambulatory Visit: Payer: Managed Care, Other (non HMO)

## 2018-05-19 ENCOUNTER — Ambulatory Visit: Payer: Managed Care, Other (non HMO) | Attending: Neurosurgery

## 2018-05-19 DIAGNOSIS — G8929 Other chronic pain: Secondary | ICD-10-CM

## 2018-05-19 DIAGNOSIS — M6281 Muscle weakness (generalized): Secondary | ICD-10-CM | POA: Diagnosis present

## 2018-05-19 DIAGNOSIS — R29818 Other symptoms and signs involving the nervous system: Secondary | ICD-10-CM | POA: Insufficient documentation

## 2018-05-19 DIAGNOSIS — R252 Cramp and spasm: Secondary | ICD-10-CM | POA: Insufficient documentation

## 2018-05-19 DIAGNOSIS — M5441 Lumbago with sciatica, right side: Secondary | ICD-10-CM | POA: Insufficient documentation

## 2018-05-19 DIAGNOSIS — R2689 Other abnormalities of gait and mobility: Secondary | ICD-10-CM | POA: Diagnosis present

## 2018-05-19 NOTE — Therapy (Signed)
[facility] Outpatient Rehabilitation [facility] [address]  [address] [place], [place], [address] Phone: [phone]   Fax:  [phone]  Physical Therapy Treatment  Patient Details  Name: Christian Mcintyre MRN: [id number] Date of Birth: 02/11/1956 Referring Provider: Garlon Hatchet., MD   Encounter Date: 05/19/2018  PT End of Session - 05/19/18 1708    Visit Number  5    Number of Visits  12    Date for PT Re-Evaluation  05/30/18    PT Start Time  1704    PT Stop Time  1748    PT Time Calculation (min)  44 min    Activity Tolerance  Patient tolerated treatment well    Behavior During Therapy  [facility] for tasks assessed/performed       Past Medical History:  Diagnosis Date  . Hypertension   . Sleep apnea    cpap  . Stroke [facility]) 2016   TIA    Past Surgical History:  Procedure Laterality Date  . BACK SURGERY     x2  . COLONOSCOPY    . EP IMPLANTABLE DEVICE N/A 05/31/2015   Procedure: Loop Recorder Insertion;  Surgeon: Hillis Range, MD;  [facility] INVASIVE CV LAB;  Service: Cardiovascular;  Laterality: N/A;  . HERNIA REPAIR     umbical  . lumbar L4/5 fusion in 2008 Bilateral 2008  . TEE WITHOUT CARDIOVERSION N/A 05/16/2015   Procedure: TRANSESOPHAGEAL ECHOCARDIOGRAM (TEE);  Surgeon: Vesta Mixer, MD;  Location: [facility] ENDOSCOPY;  Service: Cardiovascular;  Laterality: N/A;    There were no vitals filed for this visit.  Subjective Assessment - 05/19/18 1706    Subjective  Pt. noting prolonged walking and standing over last three weeks for business in [place].  No other complaints.      Pertinent History  L2-L3 Lumbar fusion on 02/12/2018    Patient Stated Goals  "get my legs and back stronger"    Currently in Pain?  No/denies    Pain Score  0-No pain    Multiple Pain Sites  No                       [facility] Adult PT Treatment/Exercise - 05/19/18 1718      Lumbar Exercises: Stretches   Single Knee to Chest Stretch  Right;Left;1 rep;30  seconds    Lower Trunk Rotation  5 reps;10 seconds    Piriformis Stretch  Right;Left;1 rep;30 seconds      Lumbar Exercises: Aerobic   Recumbent Bike  lvl 3, 5 min       Lumbar Exercises: Machines for Strengthening   Other Lumbar Machine Exercise  BATCA low row 20# 5" x 15 reps     Other Lumbar Machine Exercise  B machine pallof press 5# x 15 reps    Performed in high range     Lumbar Exercises: Standing   Other Standing Lumbar Exercises  Side stepping, monster walk (forward/backward) with red TB 2 x 25 ft each       Lumbar Exercises: Supine   Bridge with clamshell  5 seconds;10 reps    Bridge with [employer] Limitations  With alternating hip abd/ER into green TB at knees at top of movement       Lumbar Exercises: Sidelying   Clam  Right;Left;10 reps;3 seconds    Clam Limitations  B sidelhying with green TB at knees     Hip Abduction  Right;Left   x 12 reps  Other Sidelying Lumbar Exercises  B sidelying "open book" 5" x 5 reps       Lumbar Exercises: Quadruped   Madcat/Old Horse  10 reps    Straight Leg Raise  10 reps;3 seconds    Straight Leg Raises Limitations  Alternating LE kickback              PT Education - 05/19/18 1759    Person(s) Educated  Patient    Methods  Explanation;Demonstration;Verbal cues;Handout    Comprehension  Verbalized understanding;Returned demonstration;Verbal cues required;Need further instruction       PT Short Term Goals - 05/19/18 1709      PT SHORT TERM GOAL #1   Title  Pt will be independent with initial HEP    Status  Achieved        PT Long Term Goals - 04/22/18 1706      PT LONG TERM GOAL #1   Title  Pt will be independent with advanced HEP -05/29/18    Status  On-going      PT LONG TERM GOAL #2   Title  Pt will have 4/5 bilateral hip strength to improve tolerance to functional activities -05/29/18    Status  On-going      PT LONG TERM GOAL #3   Title  Pt will demonstrate and verbalize improved postural awareness  and positioning to improve joint biomechanics -05/29/18    Status  On-going      PT LONG TERM GOAL #4   Title  Pt will report ability to perform activities at home including feeding the dogs with no increase in pain or compensation -05/29/18    Status  On-going      PT LONG TERM GOAL #5   Title  Pt will demonstrate normal gait pattern with adequate step length and weight shift to R to decrease altered movement patterns and decrease risk of future injury -05/29/18    Status  On-going            Plan - 05/19/18 1709    Clinical Impression Statement  Christian Mcintyre making good progress with therapy thus far.  Returns today after ~ three week business trip to [place] with which he notes his back did well with.  Updated HEP with gentle LTR and proximal hip strengthening as pt. tolerated all activities without pain in session today.  Skipped modalities to end session per pt. request as pt. pain free.      PT Treatment/Interventions  ADLs/Self Care Home Management;Cryotherapy;Electrical Stimulation;Iontophoresis 4mg /ml Dexamethasone;Moist Heat;Gait training;Stair training;Functional mobility training;Therapeutic activities;Therapeutic exercise;Balance training;Neuromuscular re-education;Patient/family education;Manual techniques;Passive range of motion;Dry needling;Taping;Vasopneumatic Device    Consulted and Agree with Plan of Care  Patient       Patient will benefit from skilled therapeutic intervention in order to improve the following deficits and impairments:  Abnormal gait, Decreased activity tolerance, Decreased strength, Increased muscle spasms, Impaired flexibility, Impaired sensation, Impaired vision/preception, Improper body mechanics, Postural dysfunction, Pain  Visit Diagnosis: Chronic midline low back pain with right-sided sciatica  Other abnormalities of gait and mobility  Cramp and spasm  Other symptoms and signs involving the nervous system  Muscle weakness  (generalized)     Problem List Patient Active Problem List   Diagnosis Date Noted  . HNP (herniated nucleus pulposus), lumbar 02/12/2018  . Cryptogenic stroke (HCC) 07/11/2015  . TIA (transient ischemic attack) 04/28/2015  . Essential hypertension 04/28/2015  . Sleep apnea 04/28/2015  . Chronic back pain 04/28/2015  . Spinal stenosis of lumbar region  03/16/2015    Kermit Balo, PTA 05/19/18 6:00 PM   [facility] [address]  [address] [place], [place], [address] Phone: [phone]   Fax:  [phone]  Name: Christian Mcintyre MRN: [id number] Date of Birth: [birth year]

## 2018-05-22 ENCOUNTER — Ambulatory Visit: Payer: Managed Care, Other (non HMO)

## 2018-05-22 DIAGNOSIS — R252 Cramp and spasm: Secondary | ICD-10-CM

## 2018-05-22 DIAGNOSIS — M6281 Muscle weakness (generalized): Secondary | ICD-10-CM

## 2018-05-22 DIAGNOSIS — M5441 Lumbago with sciatica, right side: Secondary | ICD-10-CM | POA: Diagnosis not present

## 2018-05-22 DIAGNOSIS — R2689 Other abnormalities of gait and mobility: Secondary | ICD-10-CM

## 2018-05-22 DIAGNOSIS — R29818 Other symptoms and signs involving the nervous system: Secondary | ICD-10-CM

## 2018-05-22 DIAGNOSIS — G8929 Other chronic pain: Secondary | ICD-10-CM

## 2018-05-22 NOTE — Therapy (Signed)
[facility] [address]  [address] [place], [place], [address] Phone: [phone]   Fax:  [phone]  Physical Therapy Treatment  Patient Details  Name: Christian Mcintyre MRN: [id number] Date of Birth: [birth year] Referring Provider: Garlon HatchetGary Cram Jr., MD   Encounter Date: 05/22/2018  PT End of Session - 05/22/18 1722    Visit Number  6    Number of Visits  12    Date for PT Re-Evaluation  05/30/18    PT Start Time  1707    PT Stop Time  1750    PT Time Calculation (min)  43 min    Activity Tolerance  Patient tolerated treatment well    Behavior During Therapy  [facility] for tasks assessed/performed       Past Medical History:  Diagnosis Date  . Hypertension   . Sleep apnea    cpap  . Stroke [facility](HCC) 2016   TIA    Past Surgical History:  Procedure Laterality Date  . BACK SURGERY     x2  . COLONOSCOPY    . EP IMPLANTABLE DEVICE N/A 05/31/2015   Procedure: Loop Recorder Insertion;  Surgeon: Hillis RangeJames Allred, MD;  [facility]: [facility] INVASIVE CV LAB;  Service: Cardiovascular;  Laterality: N/A;  . HERNIA REPAIR     umbical  . lumbar L4/5 fusion in 2008 Bilateral 2008  . TEE WITHOUT CARDIOVERSION N/A 05/16/2015   Procedure: TRANSESOPHAGEAL ECHOCARDIOGRAM (TEE);  Surgeon: Vesta MixerPhilip J Nahser, MD;  [facility];  Service: Cardiovascular;  Laterality: N/A;    There were no vitals filed for this visit.  Subjective Assessment - 05/22/18 1807    Subjective  Pt. noting only stiffness in lower back following last visit.      Pertinent History  L2-L3 Lumbar fusion on 02/12/2018    Patient Stated Goals  "get my legs and back stronger"    Currently in Pain?  No/denies    Pain Score  0-No pain    Multiple Pain Sites  No                       [facility] Adult PT Treatment/Exercise - 05/22/18 1714      Lumbar Exercises: Stretches   Lumbar Stabilization Level 1  30 seconds;2 reps    Lumbar Stabilization Level 1 Limitations  L and  forward       Lumbar Exercises: Aerobic   Recumbent Bike  lvl 3, 5 min       Lumbar Exercises: Machines for Strengthening   Other Lumbar Machine Exercise  BATCA straight arm pulldown 15# x 15 reps    Cues to maintain abdom. brace      Lumbar Exercises: Standing   Functional Squats  15 reps;3 seconds    Functional Squats Limitations  Green TB at knees       Lumbar Exercises: Seated   Long Arc Quad on Chair  Both;10 reps;Strengthening   UE/LE   LAQ on Chair Limitations  seated on red p-ball     Hip Flexion on Ball  Right;Left;10 reps   UE/LE   Hip Flexion on Ball Limitations  seated on red p-ball     Other Seated Lumbar Exercises  B pallof press seated on green p-ball x 15 reps each way       Lumbar Exercises: Supine   Isometric Hip Flexion  10 reps;5 seconds    Isometric Hip Flexion Limitations  with heels on peanut p-ball  Other Supine Lumbar Exercises  Alternating single leg hip flexion isometrics 5" x 5 rpes each side       Knee/Hip Exercises: Standing   Hip Flexion  Left;Right;10 reps;Knee straight;Stengthening    Hip Flexion Limitations  Red TB at ankle; 2 ski poles     Hip ADduction  Left;Right;10 reps;Strengthening    Hip ADduction Limitations  Red TB at ankle; 2 ski poles     Hip Abduction  Right;Left;10 reps;Stengthening;Knee straight    Abduction Limitations  red TB at ankle; 2 ski poles     Hip Extension  Right;Left;10 reps;Knee straight;Stengthening    Extension Limitations  red TB at ankle; 2 ski poles                PT Short Term Goals - 05/19/18 1709      PT SHORT TERM GOAL #1   Title  Pt will be independent with initial HEP    Status  Achieved        PT Long Term Goals - 04/22/18 1706      PT LONG TERM GOAL #1   Title  Pt will be independent with advanced HEP -05/29/18    Status  On-going      PT LONG TERM GOAL #2   Title  Pt will have 4/5 bilateral hip strength to improve tolerance to functional activities -05/29/18    Status  On-going       PT LONG TERM GOAL #3   Title  Pt will demonstrate and verbalize improved postural awareness and positioning to improve joint biomechanics -05/29/18    Status  On-going      PT LONG TERM GOAL #4   Title  Pt will report ability to perform activities at home including feeding the dogs with no increase in pain or compensation -05/29/18    Status  On-going      PT LONG TERM GOAL #5   Title  Pt will demonstrate normal gait pattern with adequate step length and weight shift to R to decrease altered movement patterns and decrease risk of future injury -05/29/18    Status  On-going            Plan - 05/22/18 1758    Clinical Impression Statement  Pt. making good progress with therapy.  Feels he is responding well to therex progression well in sessions and has not been feeling back soreness following sessions only morning stiffness which subsides as day progresses.  Pt. reports daily adherence to HEP.  HEP updated today.  Will continue to progress toward goals.      PT Treatment/Interventions  ADLs/Self Care Home Management;Cryotherapy;Electrical Stimulation;Iontophoresis 4mg /ml Dexamethasone;Moist Heat;Gait training;Stair training;Functional mobility training;Therapeutic activities;Therapeutic exercise;Balance training;Neuromuscular re-education;Patient/family education;Manual techniques;Passive range of motion;Dry needling;Taping;Vasopneumatic Device    Consulted and Agree with Plan of Care  Patient       Patient will benefit from skilled therapeutic intervention in order to improve the following deficits and impairments:  Abnormal gait, Decreased activity tolerance, Decreased strength, Increased muscle spasms, Impaired flexibility, Impaired sensation, Impaired vision/preception, Improper body mechanics, Postural dysfunction, Pain  Visit Diagnosis: Chronic midline low back pain with right-sided sciatica  Other abnormalities of gait and mobility  Cramp and spasm  Other symptoms and signs  involving the nervous system  Muscle weakness (generalized)     Problem List Patient Active Problem List   Diagnosis Date Noted  . HNP (herniated nucleus pulposus), lumbar 02/12/2018  . Cryptogenic stroke (HCC) 07/11/2015  . TIA (transient ischemic attack)  04/28/2015  . Essential hypertension 04/28/2015  . Sleep apnea 04/28/2015  . Chronic back pain 04/28/2015  . Spinal stenosis of lumbar region 03/16/2015    Kermit Balo, PTA 05/22/18 6:08 PM   [facility] [address]  [address] [place], [place], [address] Phone: [phone]   Fax:  [phone]  Name: Christian Mcintyre MRN: [id number] Date of Birth: [birth year]

## 2018-05-26 ENCOUNTER — Ambulatory Visit: Payer: Managed Care, Other (non HMO)

## 2018-05-26 DIAGNOSIS — R252 Cramp and spasm: Secondary | ICD-10-CM

## 2018-05-26 DIAGNOSIS — R29818 Other symptoms and signs involving the nervous system: Secondary | ICD-10-CM

## 2018-05-26 DIAGNOSIS — G8929 Other chronic pain: Secondary | ICD-10-CM

## 2018-05-26 DIAGNOSIS — M5441 Lumbago with sciatica, right side: Principal | ICD-10-CM

## 2018-05-26 DIAGNOSIS — M6281 Muscle weakness (generalized): Secondary | ICD-10-CM

## 2018-05-26 DIAGNOSIS — R2689 Other abnormalities of gait and mobility: Secondary | ICD-10-CM

## 2018-05-26 NOTE — Patient Instructions (Addendum)

## 2018-05-26 NOTE — Therapy (Signed)
[facility] [address]  [address] [place], [place], [address] Phone: [phone]   Fax:  [phone]  Physical Therapy Treatment  Patient Details  Name: Pauline Ausaul Peake MRN: [id number] Date of Birth: 19-Mar-1956 Referring Provider: Garlon HatchetGary Cram Jr., MD   Encounter Date: 05/26/2018  PT End of Session - 05/26/18 1711    Visit Number  7    Number of Visits  12    Date for PT Re-Evaluation  05/30/18    PT Start Time  1708    PT Stop Time  1746    PT Time Calculation (min)  38 min    Activity Tolerance  Patient tolerated treatment well    Behavior During Therapy  [facility] for tasks assessed/performed       Past Medical History:  Diagnosis Date  . Hypertension   . Sleep apnea    cpap  . Stroke [facility](HCC) 2016   TIA    Past Surgical History:  Procedure Laterality Date  . BACK SURGERY     x2  . COLONOSCOPY    . EP IMPLANTABLE DEVICE N/A 05/31/2015   Procedure: Loop Recorder Insertion;  Surgeon: Hillis RangeJames Allred, MD;  [facility] INVASIVE CV LAB;  Service: Cardiovascular;  Laterality: N/A;  . HERNIA REPAIR     umbical  . lumbar L4/5 fusion in 2008 Bilateral 2008  . TEE WITHOUT CARDIOVERSION N/A 05/16/2015   Procedure: TRANSESOPHAGEAL ECHOCARDIOGRAM (TEE);  Surgeon: Vesta MixerPhilip J Nahser, MD;  [facility];  Service: Cardiovascular;  Laterality: N/A;    There were no vitals filed for this visit.  Subjective Assessment - 05/26/18 1755    Subjective  Pt. doing well today.      Pertinent History  L2-L3 Lumbar fusion on 02/12/2018    Patient Stated Goals  "get my legs and back stronger"    Currently in Pain?  No/denies    Pain Score  0-No pain    Multiple Pain Sites  No                       [facility] Adult PT Treatment/Exercise - 05/26/18 1718      Self-Care   Self-Care  Other Self-Care Comments    Other Self-Care Comments   Posture and body mechanics discussion with daily tasks as to reduce lumbar strain       Lumbar Exercises: Aerobic   Recumbent Bike  lvl 3, 6 min       Lumbar Exercises: Machines for Strengthening   Other Lumbar Machine Exercise  BATCA low row 25# 3" x 15 resp     Other Lumbar Machine Exercise  BATCA pulldown 35# x 15 reps;  B HS curl 30#   2 x 15 reps       Lumbar Exercises: Standing   Other Standing Lumbar Exercises  B tandem stance pallof press with green TB x 15 reps each wya       Lumbar Exercises: Supine   Dead Bug  10 reps;3 seconds    Dead Bug Limitations  UE/LE      Lumbar Exercises: Sidelying   Other Sidelying Lumbar Exercises  B sidelying "open book" 5" x 10 reps       Knee/Hip Exercises: Standing   Forward Lunges  Right;Left;10 reps             PT Education - 05/26/18 1805    Education Details  HEP update     Person(s) Educated  Patient  Methods  Explanation;Demonstration;Verbal cues;Handout    Comprehension  Verbalized understanding;Returned demonstration;Verbal cues required;Need further instruction       PT Short Term Goals - 05/19/18 1709      PT SHORT TERM GOAL #1   Title  Pt will be independent with initial HEP    Status  Achieved        PT Long Term Goals - 04/22/18 1706      PT LONG TERM GOAL #1   Title  Pt will be independent with advanced HEP -05/29/18    Status  On-going      PT LONG TERM GOAL #2   Title  Pt will have 4/5 bilateral hip strength to improve tolerance to functional activities -05/29/18    Status  On-going      PT LONG TERM GOAL #3   Title  Pt will demonstrate and verbalize improved postural awareness and positioning to improve joint biomechanics -05/29/18    Status  On-going      PT LONG TERM GOAL #4   Title  Pt will report ability to perform activities at home including feeding the dogs with no increase in pain or compensation -05/29/18    Status  On-going      PT LONG TERM GOAL #5   Title  Pt will demonstrate normal gait pattern with adequate step length and weight shift to R to decrease altered movement  patterns and decrease risk of future injury -05/29/18    Status  On-going            Plan - 05/26/18 1712    Clinical Impression Statement  Javoris performed well with all lumbopelvic strengthening therex today with addition of Dead Bug, and advancement of Pallof Press to tandem stance.  Progressing well toward goals at this point.      PT Treatment/Interventions  ADLs/Self Care Home Management;Cryotherapy;Electrical Stimulation;Iontophoresis 4mg /ml Dexamethasone;Moist Heat;Gait training;Stair training;Functional mobility training;Therapeutic activities;Therapeutic exercise;Balance training;Neuromuscular re-education;Patient/family education;Manual techniques;Passive range of motion;Dry needling;Taping;Vasopneumatic Device    Consulted and Agree with Plan of Care  Patient       Patient will benefit from skilled therapeutic intervention in order to improve the following deficits and impairments:  Abnormal gait, Decreased activity tolerance, Decreased strength, Increased muscle spasms, Impaired flexibility, Impaired sensation, Impaired vision/preception, Improper body mechanics, Postural dysfunction, Pain  Visit Diagnosis: Chronic midline low back pain with right-sided sciatica  Other abnormalities of gait and mobility  Cramp and spasm  Other symptoms and signs involving the nervous system  Muscle weakness (generalized)     Problem List Patient Active Problem List   Diagnosis Date Noted  . HNP (herniated nucleus pulposus), lumbar 02/12/2018  . Cryptogenic stroke (HCC) 07/11/2015  . TIA (transient ischemic attack) 04/28/2015  . Essential hypertension 04/28/2015  . Sleep apnea 04/28/2015  . Chronic back pain 04/28/2015  . Spinal stenosis of lumbar region 03/16/2015    Kermit Balo, PTA 05/26/18 6:07 PM   [facility] [address]  [address] [place], [place], [address] Phone: [phone]   Fax:  [phone]  Name: Derric Dealmeida MRN: [id number] Date of Birth: 1956/03/25

## 2018-05-29 ENCOUNTER — Ambulatory Visit: Payer: Managed Care, Other (non HMO)

## 2018-05-29 DIAGNOSIS — G8929 Other chronic pain: Secondary | ICD-10-CM

## 2018-05-29 DIAGNOSIS — R252 Cramp and spasm: Secondary | ICD-10-CM

## 2018-05-29 DIAGNOSIS — M5441 Lumbago with sciatica, right side: Principal | ICD-10-CM

## 2018-05-29 DIAGNOSIS — R2689 Other abnormalities of gait and mobility: Secondary | ICD-10-CM

## 2018-05-29 DIAGNOSIS — R29818 Other symptoms and signs involving the nervous system: Secondary | ICD-10-CM

## 2018-05-29 DIAGNOSIS — M6281 Muscle weakness (generalized): Secondary | ICD-10-CM

## 2018-05-29 NOTE — Therapy (Addendum)
[facility] [address]  [place], [place], [address] Phone: [phone]   Fax:  [phone]  Physical Therapy Treatment  Patient Details  Name: Christian Mcintyre MRN: [id number] Date of Birth: 06-10-1956 Referring Provider (PT): Grayland Jack., MD   Encounter Date: 05/29/2018  PT End of Session - 05/29/18 1711    Visit Number  8    Number of Visits  12    Date for PT Re-Evaluation  05/30/18    PT Start Time  1962    PT Stop Time  1750    PT Time Calculation (min)  41 min    Activity Tolerance  Patient tolerated treatment well    Behavior During Therapy  [facility] for tasks assessed/performed       Past Medical History:  Diagnosis Date  . Hypertension   . Sleep apnea    cpap  . Stroke [facility]) 2016   TIA    Past Surgical History:  Procedure Laterality Date  . BACK SURGERY     x2  . COLONOSCOPY    . EP IMPLANTABLE DEVICE N/A 05/31/2015   Procedure: Loop Recorder Insertion;  Surgeon: Thompson Grayer, MD;  Location: [place] [facility];  Service: Cardiovascular;  Laterality: N/A;  . HERNIA REPAIR     umbical  . lumbar L4/5 fusion in 2008 Bilateral 2008  . TEE WITHOUT CARDIOVERSION N/A 05/16/2015   Procedure: TRANSESOPHAGEAL ECHOCARDIOGRAM (TEE);  Surgeon: Thayer Headings, MD;  Location: [place];  Service: Cardiovascular;  Laterality: N/A;    There were no vitals filed for this visit.  Subjective Assessment - 05/29/18 1710    Subjective  Pt. noting updated HEP doing well.      Pertinent History  L2-L3 Lumbar fusion on 02/12/2018    Patient Stated Goals  "get my legs and back stronger"    Currently in Pain?  No/denies    Pain Score  0-No pain    Multiple Pain Sites  No                       [facility] Adult PT Treatment/Exercise - 05/29/18 1717      Lumbar Exercises: Stretches   Passive Hamstring Stretch  Right;Left;30 seconds    Passive Hamstring Stretch Limitations  with strap     Hip Flexor  Stretch  Right;Left;1 rep;30 seconds    Hip Flexor Stretch Limitations  with strap in mod thomas position       Lumbar Exercises: Aerobic   Recumbent Bike  lvl 3, 6 min       Lumbar Exercises: Machines for Strengthening   Other Lumbar Machine Exercise  BATCA serratus punch 25# x 20 reps     Other Lumbar Machine Exercise  BATCA pulldown 35# x 15 reps B HS curl 30#   2 x 15 reps       Lumbar Exercises: Seated   Sit to Stand  15 reps    Sit to Stand Limitations  with green TB at knees with isometric hip abd/ER       Lumbar Exercises: Sidelying   Clam  Right;Left;3 seconds   x 12 reps      Lumbar Exercises: Prone   Other Prone Lumbar Exercises  Prone I's (2#), T's, Y's over green p-ball x 10 rpes       Lumbar Exercises: Quadruped   Opposite Arm/Leg Raise  Right arm/Left leg;Left arm/Right leg;10 reps  PT Short Term Goals - 05/19/18 1709      PT SHORT TERM GOAL #1   Title  Pt will be independent with initial HEP    Status  Achieved        PT Long Term Goals - 05/29/18 1755      PT LONG TERM GOAL #1   Title  Pt will be independent with advanced HEP -05/29/18    Status  Partially Met      PT LONG TERM GOAL #2   Title  Pt will have 4/5 bilateral hip strength to improve tolerance to functional activities -05/29/18    Status  On-going      PT LONG TERM GOAL #3   Title  Pt will demonstrate and verbalize improved postural awareness and positioning to improve joint biomechanics -05/29/18    Status  Achieved      PT LONG TERM GOAL #4   Title  Pt will report ability to perform activities at home including feeding the dogs with no increase in pain or compensation -05/29/18    Status  Achieved      PT LONG TERM GOAL #5   Title  Pt will demonstrate normal gait pattern with adequate step length and weight shift to R to decrease altered movement patterns and decrease risk of future injury -05/29/18    Status  Partially Met            Plan - 05/29/18 1712     Clinical Impression Statement  Eddie Dibbles reporting ease with feeding dogs and other chores at home without feeling need for compensation.  Lawrance is able to verbalized good understanding of proper body mechanics and posture to reduce lumbar strain.  Session focusing on proximal hip and thoracolumbar stability activities today, which were tolerated well.  Progressing well toward goals of current POC and Wane's primary concern is feeling that he occasionally does not stand up straight, feeling need for lateral trunk lean however unsure of pain or triggering factor with this.  Notes no issues with updated HEP and reports daily adherence to HEP.  Pt. noting he wishes to address shoulder and has had referral sent for this however wishes to continue some focus on lumbar spine as well in therapy.  Will continue to progress toward goals.    PT Treatment/Interventions  ADLs/Self Care Home Management;Cryotherapy;Electrical Stimulation;Iontophoresis 62m/ml Dexamethasone;Moist Heat;Gait training;Stair training;Functional mobility training;Therapeutic activities;Therapeutic exercise;Balance training;Neuromuscular re-education;Patient/family education;Manual techniques;Passive range of motion;Dry needling;Taping;Vasopneumatic Device    Consulted and Agree with Plan of Care  Patient       Patient will benefit from skilled therapeutic intervention in order to improve the following deficits and impairments:  Abnormal gait, Decreased activity tolerance, Decreased strength, Increased muscle spasms, Impaired flexibility, Impaired sensation, Impaired vision/preception, Improper body mechanics, Postural dysfunction, Pain  Visit Diagnosis: Chronic midline low back pain with right-sided sciatica  Other abnormalities of gait and mobility  Cramp and spasm  Other symptoms and signs involving the nervous system  Muscle weakness (generalized)     Problem List Patient Active Problem List   Diagnosis Date Noted  . HNP  (herniated nucleus pulposus), lumbar 02/12/2018  . Cryptogenic stroke ([facility] 07/11/2015  . TIA (transient ischemic attack) 04/28/2015  . Essential hypertension 04/28/2015  . Sleep apnea 04/28/2015  . Chronic back pain 04/28/2015  . Spinal stenosis of lumbar region 03/16/2015    MBess Harvest PTA 05/29/18 6:03 PM    [facility] [address]  [place], [place], [address] Phone: [phone]   Fax:  [phone]  Name: Lakota Markgraf MRN: [id number] Date of Birth: [birth year]

## 2018-06-05 ENCOUNTER — Encounter: Payer: Self-pay | Admitting: Physical Therapy

## 2018-06-05 ENCOUNTER — Ambulatory Visit: Payer: Managed Care, Other (non HMO) | Attending: Neurosurgery | Admitting: Physical Therapy

## 2018-06-05 DIAGNOSIS — R2689 Other abnormalities of gait and mobility: Secondary | ICD-10-CM | POA: Insufficient documentation

## 2018-06-05 DIAGNOSIS — R29818 Other symptoms and signs involving the nervous system: Secondary | ICD-10-CM | POA: Diagnosis present

## 2018-06-05 DIAGNOSIS — G8929 Other chronic pain: Secondary | ICD-10-CM | POA: Diagnosis present

## 2018-06-05 DIAGNOSIS — R252 Cramp and spasm: Secondary | ICD-10-CM | POA: Diagnosis present

## 2018-06-05 DIAGNOSIS — M6281 Muscle weakness (generalized): Secondary | ICD-10-CM | POA: Diagnosis present

## 2018-06-05 DIAGNOSIS — M5441 Lumbago with sciatica, right side: Secondary | ICD-10-CM | POA: Insufficient documentation

## 2018-06-05 NOTE — Therapy (Addendum)
[facility] [address]  [place], [place], [address] Phone: [phone]   Fax:  [phone]  Physical Therapy Treatment  Patient Details  Name: Christian Mcintyre MRN: [id number] Date of Birth: October 30, 1955 Referring Provider (PT): Grayland Jack., MD   Encounter Date: 06/05/2018  PT End of Session - 06/05/18 0815    Visit Number  9    Number of Visits  12    Date for PT Re-Evaluation  06/20/18    PT Start Time  0815   pt arrived late   PT Stop Time  0851    PT Time Calculation (min)  36 min    Activity Tolerance  Patient tolerated treatment well    Behavior During Therapy  [facility] for tasks assessed/performed       Past Medical History:  Diagnosis Date  . Hypertension   . Sleep apnea    cpap  . Stroke [facility]) 2016   TIA    Past Surgical History:  Procedure Laterality Date  . BACK SURGERY     x2  . COLONOSCOPY    . EP IMPLANTABLE DEVICE N/A 05/31/2015   Procedure: Loop Recorder Insertion;  Surgeon: Thompson Grayer, MD;  Location: [place] [facility];  Service: Cardiovascular;  Laterality: N/A;  . HERNIA REPAIR     umbical  . lumbar L4/5 fusion in 2008 Bilateral 2008  . TEE WITHOUT CARDIOVERSION N/A 05/16/2015   Procedure: TRANSESOPHAGEAL ECHOCARDIOGRAM (TEE);  Surgeon: Thayer Headings, MD;  Location: [place];  Service: Cardiovascular;  Laterality: N/A;    There were no vitals filed for this visit.  Subjective Assessment - 06/05/18 0817    Subjective  Pt reporting "no pain is the usual" but noting mild pain this morning "like I slept on it wrong". Pt reports his PCP has written him a referral for his shoulder but not yet received.    Pertinent History  L2-L3 Lumbar fusion on 02/12/2018    Patient Stated Goals  "get my legs and back stronger"    Currently in Pain?  Yes    Pain Score  2     Pain Location  Back    Pain Orientation  Lower;Right    Pain Descriptors / Indicators  Dull   "slept on it wrong"   Pain  Type  Surgical pain    Pain Frequency  Intermittent                       [facility] Adult PT Treatment/Exercise - 06/05/18 0815      Exercises   Exercises  Lumbar      Lumbar Exercises: Aerobic   Recumbent Bike  L4 x 5 min        Stretches  Seated 3 way prayer stretch with green Pball x 30 sec each  Standing B sidebending QL/ITB stretch x 30 sec each Manual Therapy    Manual Therapy  Soft tissue mobilization;Myofascial release    Manual therapy comments  prone    Soft tissue mobilization  B lumbar paraspinals    Myofascial Release  pin & stretch to B lumbar paraspinals      Trigger Point Dry Needling - 06/05/18 0815    Consent Given?  Yes    Education Handout Provided  Yes    Muscles Treated Upper Body  Longissimus   & lumbar multifidi   Longissimus Response  Twitch response elicited;Palpable increased muscle length   bilateral  PT Education - 06/05/18 0825    Education Details  Role of DN & expected response to treatment; HEP update    Person(s) Educated  Patient    Methods  Explanation;Demonstration;Handout    Comprehension  Verbalized understanding;Returned demonstration       PT Short Term Goals - 05/19/18 1709      PT SHORT TERM GOAL #1   Title  Pt will be independent with initial HEP    Status  Achieved        PT Long Term Goals - 06/05/18 0851      PT LONG TERM GOAL #1   Title  Pt will be independent with advanced HEP     Status  Partially Met    Target Date  06/20/18      PT LONG TERM GOAL #2   Title  Pt will have 4/5 bilateral hip strength to improve tolerance to functional activities     Status  On-going    Target Date  06/20/18      PT LONG TERM GOAL #3   Title  Pt will demonstrate and verbalize improved postural awareness and positioning to improve joint biomechanics     Status  Achieved      PT LONG TERM GOAL #4   Title  Pt will report ability to perform activities at home including feeding the dogs with no  increase in pain or compensation     Status  Achieved      PT LONG TERM GOAL #5   Title  Pt will demonstrate normal gait pattern with adequate step length and weight shift to R to decrease altered movement patterns and decrease risk of future injury     Status  Partially Met            Plan - 06/05/18 0820    Clinical Impression Statement  [name] reporting back pain overall well controlled, and remaining concern is that he feels like he is leaning to the left in his torso. Postural assessment revealed apparent mild scoliosis with increased muscle tension in thoracolumbar paraspinals. This was addressed with manual therapy including DN after informed pt consent with positive twitch response and palpable reduction in paraspinal muscle tension following treatment. Pt educated in stretches to promoted further muscle relaxation and improved flexibility. Will continue to address postural alignment in remaining visits in existing lumbar fusion POC with recert date and remaining LTGs extended to 06/20/18 to accomodate for prior missed visits. [place] office to contact PCP Yong Channel, MD) regarding shoulder referral and will schedule eval for this upon completion of PT episode for lumbar fusion.    PT Treatment/Interventions  ADLs/Self Care Home Management;Cryotherapy;Electrical Stimulation;Iontophoresis 58m/ml Dexamethasone;Moist Heat;Gait training;Stair training;Functional mobility training;Therapeutic activities;Therapeutic exercise;Balance training;Neuromuscular re-education;Patient/family education;Manual techniques;Passive range of motion;Dry needling;Taping;Vasopneumatic Device    Consulted and Agree with Plan of Care  Patient       Patient will benefit from skilled therapeutic intervention in order to improve the following deficits and impairments:  Abnormal gait, Decreased activity tolerance, Decreased strength, Increased muscle spasms, Impaired flexibility, Impaired sensation, Impaired  vision/preception, Improper body mechanics, Postural dysfunction, Pain  Visit Diagnosis: Chronic midline low back pain with right-sided sciatica  Other abnormalities of gait and mobility  Cramp and spasm  Other symptoms and signs involving the nervous system  Muscle weakness (generalized)     Problem List Patient Active Problem List   Diagnosis Date Noted  . HNP (herniated nucleus pulposus), lumbar 02/12/2018  . Cryptogenic stroke ([place] 07/11/2015  .  TIA (transient ischemic attack) 04/28/2015  . Essential hypertension 04/28/2015  . Sleep apnea 04/28/2015  . Chronic back pain 04/28/2015  . Spinal stenosis of lumbar region 03/16/2015    Percival Spanish, PT, MPT 06/05/2018, 12:29 PM  [facility] [address]  [place], [place], [address] Phone: [phone]   Fax:  [phone]  Name: Arcangel Minion MRN: [id number] Date of Birth: Dec 10, 1955

## 2018-06-05 NOTE — Patient Instructions (Addendum)

## 2018-06-10 ENCOUNTER — Ambulatory Visit: Payer: Managed Care, Other (non HMO) | Admitting: Physical Therapy

## 2018-06-10 DIAGNOSIS — R252 Cramp and spasm: Secondary | ICD-10-CM

## 2018-06-10 DIAGNOSIS — R2689 Other abnormalities of gait and mobility: Secondary | ICD-10-CM

## 2018-06-10 DIAGNOSIS — M6281 Muscle weakness (generalized): Secondary | ICD-10-CM

## 2018-06-10 DIAGNOSIS — M5441 Lumbago with sciatica, right side: Principal | ICD-10-CM

## 2018-06-10 DIAGNOSIS — G8929 Other chronic pain: Secondary | ICD-10-CM

## 2018-06-10 DIAGNOSIS — R29818 Other symptoms and signs involving the nervous system: Secondary | ICD-10-CM

## 2018-06-10 NOTE — Therapy (Signed)
[facility] [address]  [place], [place], [address] Phone: [phone]   Fax:  [phone]  Physical Therapy Treatment  Patient Details  Name: Christian Mcintyre MRN: [id number] Date of Birth: 1956-08-13 Referring Provider (PT): Grayland Jack., MD   Encounter Date: 06/10/2018  PT End of Session - 06/10/18 1017    Visit Number  10    Number of Visits  12    Date for PT Re-Evaluation  06/20/18    PT Start Time  1017    PT Stop Time  1055    PT Time Calculation (min)  38 min    Activity Tolerance  Patient tolerated treatment well    Behavior During Therapy  [facility] for tasks assessed/performed       Past Medical History:  Diagnosis Date  . Hypertension   . Sleep apnea    cpap  . Stroke [facility]) 2016   TIA    Past Surgical History:  Procedure Laterality Date  . BACK SURGERY     x2  . COLONOSCOPY    . EP IMPLANTABLE DEVICE N/A 05/31/2015   Procedure: Loop Recorder Insertion;  Surgeon: Thompson Grayer, MD;  Location: [place] [facility];  Service: Cardiovascular;  Laterality: N/A;  . HERNIA REPAIR     umbical  . lumbar L4/5 fusion in 2008 Bilateral 2008  . TEE WITHOUT CARDIOVERSION N/A 05/16/2015   Procedure: TRANSESOPHAGEAL ECHOCARDIOGRAM (TEE);  Surgeon: Thayer Headings, MD;  Location: [place];  Service: Cardiovascular;  Laterality: N/A;    There were no vitals filed for this visit.  Subjective Assessment - 06/10/18 1019    Subjective  Pt feels like the DN helped last visit - feels like he is now more vertical.    Pertinent History  L2-L3 Lumbar fusion on 02/12/2018    Patient Stated Goals  "get my legs and back stronger"    Currently in Pain?  No/denies         [facility] PT Assessment - 06/10/18 1017      Assessment   Medical Diagnosis  Lumbago of lumbar region with sciatica    Referring Provider (PT)  Grayland Jack., MD    Onset Date/Surgical Date  02/12/18    Next MD Visit  Nov 2019      Observation/Other  Assessments   Focus on Therapeutic Outcomes (FOTO)   Lumbar 77% (23% limitation)      Strength   Right Hip Flexion  5/5    Right Hip Extension  5/5    Right Hip External Rotation   5/5    Right Hip Internal Rotation  5/5    Right Hip ABduction  5/5    Right Hip ADduction  5/5    Left Hip Flexion  5/5    Left Hip Extension  5/5    Left Hip External Rotation  5/5    Left Hip Internal Rotation  5/5    Left Hip ABduction  5/5    Left Hip ADduction  5/5                   [facility] Adult PT Treatment/Exercise - 06/10/18 1017      Lumbar Exercises: Aerobic   Tread Mill  3.3 mph x 6 min       Manual Therapy   Manual Therapy  Soft tissue mobilization;Myofascial release    Manual therapy comments  prone    Soft tissue mobilization  B lumbar paraspinals  Myofascial Release  pin & stretch to B lumbar paraspinals       Trigger Point Dry Needling - 06/10/18 1017    Consent Given?  Yes    Muscles Treated Upper Body  Longissimus   & lumbar multifidi   Longissimus Response  Twitch response elicited;Palpable increased muscle length   bilateral            PT Short Term Goals - 05/19/18 1709      PT SHORT TERM GOAL #1   Title  Pt will be independent with initial HEP    Status  Achieved        PT Long Term Goals - 06/10/18 1020      PT LONG TERM GOAL #1   Title  Pt will be independent with advanced HEP     Status  Achieved      PT LONG TERM GOAL #2   Title  Pt will have 4/5 bilateral hip strength to improve tolerance to functional activities     Status  Achieved      PT LONG TERM GOAL #3   Title  Pt will demonstrate and verbalize improved postural awareness and positioning to improve joint biomechanics     Status  Achieved      PT LONG TERM GOAL #4   Title  Pt will report ability to perform activities at home including feeding the dogs with no increase in pain or compensation     Status  Achieved      PT LONG TERM GOAL #5   Title  Pt will demonstrate  normal gait pattern with adequate step length and weight shift to R to decrease altered movement patterns and decrease risk of future injury     Status  Achieved            Plan - 06/10/18 1017    Clinical Impression Statement  Christian Mcintyre noting benefit from DN last session with improved ability to stand upright w/o lateral lean. Overall he has demonstrated good progress with PT - no pain, improved proximal LE strength to 5/5, and able to resume majority of normal daily activities w/o limitation due to lumbar spine. All goals met  and Christian Mcintyre is pleased with his current progress and would like to complete the current epsiode for his low back to allow [place] to proceed with his shoulder evaluation.    Rehab Potential  Good    PT Treatment/Interventions  ADLs/Self Care Home Management;Cryotherapy;Electrical Stimulation;Iontophoresis 31m/ml Dexamethasone;Moist Heat;Gait training;Stair training;Functional mobility training;Therapeutic activities;Therapeutic exercise;Balance training;Neuromuscular re-education;Patient/family education;Manual techniques;Passive range of motion;Dry needling;Taping;Vasopneumatic Device    PT Next Visit Plan  Discharge lumbar epsiode; Eval for shoulder referral    Consulted and Agree with Plan of Care  Patient       Patient will benefit from skilled therapeutic intervention in order to improve the following deficits and impairments:  Abnormal gait, Decreased activity tolerance, Decreased strength, Increased muscle spasms, Impaired flexibility, Impaired sensation, Impaired vision/preception, Improper body mechanics, Postural dysfunction, Pain  Visit Diagnosis: Chronic midline low back pain with right-sided sciatica  Other abnormalities of gait and mobility  Cramp and spasm  Other symptoms and signs involving the nervous system  Muscle weakness (generalized)     Problem List Patient Active Problem List   Diagnosis Date Noted  . HNP (herniated nucleus pulposus), lumbar  02/12/2018  . Cryptogenic stroke ([facility] 07/11/2015  . TIA (transient ischemic attack) 04/28/2015  . Essential hypertension 04/28/2015  . Sleep apnea 04/28/2015  . Chronic back  pain 04/28/2015  . Spinal stenosis of lumbar region 03/16/2015   PHYSICAL THERAPY DISCHARGE SUMMARY  Visits from Start of Care: 10  Current functional level related to goals / functional outcomes:   Refer to above clinical impression.   Remaining deficits:   As above.   Education / Equipment:   HEP, Training and development officer education  Plan: Patient agrees to discharge.  Patient goals were met. Patient is being discharged due to meeting the stated rehab goals.  ?????      Christian Mcintyre 06/10/2018, 12:35 PM  [facility] [address]  [place], [place], [address] Phone: [phone]   Fax:  [phone]  Name: Christian Mcintyre MRN: [id number] Date of Birth: October 21, 1955

## 2018-06-18 ENCOUNTER — Ambulatory Visit: Payer: Managed Care, Other (non HMO) | Attending: Internal Medicine | Admitting: Physical Therapy

## 2018-06-18 ENCOUNTER — Other Ambulatory Visit: Payer: Self-pay

## 2018-06-18 DIAGNOSIS — R293 Abnormal posture: Secondary | ICD-10-CM | POA: Diagnosis present

## 2018-06-18 DIAGNOSIS — M25511 Pain in right shoulder: Secondary | ICD-10-CM | POA: Diagnosis present

## 2018-06-18 DIAGNOSIS — M25611 Stiffness of right shoulder, not elsewhere classified: Secondary | ICD-10-CM | POA: Insufficient documentation

## 2018-06-18 NOTE — Addendum Note (Signed)
Addended by: Marry Guan on: 06/18/2018 01:46 PM   Modules accepted: Orders

## 2018-06-18 NOTE — Therapy (Signed)
[facility] [address]  [address] [place], [place], [address] Phone: [phone]   Fax:  [phone]  Physical Therapy Evaluation  Patient Details  Name: Christian Mcintyre MRN: [id number] Date of Birth: [birth year] Referring Provider (PT): Dennis Bast, MD   Encounter Date: 06/18/2018  PT End of Session - 06/18/18 0810    Visit Number  1    Number of Visits  8    Date for PT Re-Evaluation  07/16/18    Authorization Type  [employer]    PT Start Time  0810   arrived late   PT Stop Time  0849    PT Time Calculation (min)  39 min    Activity Tolerance  Patient tolerated treatment well    Behavior During Therapy  [facility] for tasks assessed/performed       Past Medical History:  Diagnosis Date  . Hypertension   . Sleep apnea    cpap  . Stroke [facility]) 2016   TIA    Past Surgical History:  Procedure Laterality Date  . BACK SURGERY     x2  . COLONOSCOPY    . EP IMPLANTABLE DEVICE N/A 05/31/2015   Procedure: Loop Recorder Insertion;  Surgeon: Hillis Range, MD;  [facility]: [facility] INVASIVE CV LAB;  Service: Cardiovascular;  Laterality: N/A;  . HERNIA REPAIR     umbical  . lumbar L4/5 fusion in 2008 Bilateral 2008  . TEE WITHOUT CARDIOVERSION N/A 05/16/2015   Procedure: TRANSESOPHAGEAL ECHOCARDIOGRAM (TEE);  Surgeon: Vesta Mixer, MD;  Location: [facility] ENDOSCOPY;  Service: Cardiovascular;  Laterality: N/A;    There were no vitals filed for this visit.   Subjective Assessment - 06/18/18 0813    Subjective  Pt reports he feel 5-7 times prior to back surgery including one backward fall down the stairs - had rib and shoulder pain at the time - rib pain resolved, but shoulder pain persists with pt describing sensation of shoulder "coming out of the socket" when he raises his arm away from his body. Pt reports remote h/o traumatic R shoulder dislocation which did not require any surgical or rehab intervention at the time.    Patient Stated Goals   "be able to use my R arm w/o feeling like my shoulder will pop out"    Currently in Pain?  Yes    Pain Score  0-No pain   up to 6-7/10 with certian motions   Pain Location  Shoulder    Pain Orientation  Right;Upper;Anterior    Pain Descriptors / Indicators  Sharp    Pain Type  Acute pain    Pain Radiating Towards  n/a    Pain Onset  More than a month ago   4 months   Pain Frequency  Intermittent    Aggravating Factors   reaching/lifting overhead or away from body    Pain Relieving Factors  resting position of arm    Effect of Pain on Daily Activities  limits placing things in car; lifting anything >1# away from body         [facility] PT Assessment - 06/18/18 0810      Assessment   Medical Diagnosis  R acute shoulder pain    Referring Provider (PT)  Dennis Bast, MD    Onset Date/Surgical Date  --   ~4 months ago   Hand Dominance  Right    Next MD Visit  ~3 months - not specific to shoulder  Balance Screen   Has the patient fallen in the past 6 months  Yes    How many times?  7    Has the patient had a decrease in activity level because of a fear of falling?   No    Is the patient reluctant to leave their home because of a fear of falling?   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, Training and development officer, skiing, golfing      Observation/Other Assessments   Focus on Therapeutic Outcomes (FOTO)   Shoulder 59% (41% limitation); predicted 66% (34% limitation)      Posture/Postural Control   Posture/Postural Control  Postural limitations    Postural Limitations  Rounded Shoulders;Decreased lumbar  lordosis;Flexed trunk;Weight shift left      ROM / Strength   AROM / PROM / Strength  AROM;Strength      AROM   AROM Assessment Site  Shoulder    Right/Left Shoulder  Right;Left    Right Shoulder Flexion  155 Degrees    Right Shoulder ABduction  134 Degrees    Right Shoulder Internal Rotation  75 Degrees   apprehension of pain   Right Shoulder External Rotation  79 Degrees    Left Shoulder Flexion  156 Degrees    Left Shoulder ABduction  126 Degrees    Left Shoulder Internal Rotation  79 Degrees    Left Shoulder External Rotation  94 Degrees      Strength   Strength Assessment Site  Shoulder    Right/Left Shoulder  Right;Left    Right Shoulder Flexion  4+/5    Right Shoulder ABduction  4+/5    Right Shoulder Internal Rotation  4+/5    Right Shoulder External Rotation  4+/5    Left Shoulder Flexion  5/5    Left Shoulder ABduction  5/5    Left Shoulder Internal Rotation  5/5    Left Shoulder External Rotation  4+/5      Palpation   Palpation comment  TTP over R coracoid process, pec minor, supraspinatus, infraspinatus & teres  group      Special Tests    Special Tests  Rotator Cuff Impingement    Rotator Cuff Impingment tests  Leanord Asal test;Empty Can test      Hawkins-Kennedy test   Findings  Negative    Side  Right      Empty Can test   Findings  Negative    Side  Right                Objective measurements completed on examination: See above findings.      [facility] Adult PT Treatment/Exercise - 06/18/18 0810      Exercises   Exercises  Shoulder      Shoulder Exercises: Standing   Extension  Both;15 reps;Theraband    Theraband Level (Shoulder Extension)  Level 3 (Green)    Extension Limitations  cues for scap retraction  Row  Both;15 reps;Theraband;Strengthening    Theraband Level (Shoulder Row)  Level 3 (Green)    Row Limitations  cues for scap retraction & depression      Shoulder Exercises: Lawyer  30 seconds;1 rep    IT sales professional Limitations  3 way doorway stretch             PT Education - 06/18/18 0845    Education Details  PT eval findings, anticipated POC & initial HEP    Person(s) Educated  Patient    Methods  Explanation;Demonstration;Handout    Comprehension  Verbalized understanding;Returned demonstration       PT Short Term Goals - 05/19/18 1709      PT SHORT TERM GOAL #1   Title  Pt will be independent with initial HEP    Status  Achieved        PT Long Term Goals - 06/18/18 0849      PT LONG TERM GOAL #1   Title  Pt will be independent with advanced/ongoing HEP     Status  New    Target Date  07/16/18      PT LONG TERM GOAL #2   Title  R shoulder ER AROM equivalent to L w/o apprehension of subluxation    Status  New    Target Date  07/16/18      PT LONG TERM GOAL #3   Title  R shoulder strength 5/5 with good scapular activation    Status  New    Target Date  07/16/18      PT LONG TERM GOAL #4   Title  Pt will report ability to use R shoulder for normal daily tasks inlcuding placing briefcase/groceries in car w/o limitation due to pain or apprehension of subluxation    Status  New    Target Date  07/16/18             Plan - 06/18/18 1211    Clinical Impression Statement  [place] is a 62 y/o male evaluated today for R shoulder pain originating after a fall down the stairs ~4 months ago. Pt familiar to this PT after completing a recent PT episode s/p lumbar fusion surgery. Pt's primary complaint is sudden onset brief duration pain near [facility] joint with sensation of R shoulder "coming out the socket" when reaching or lifting away from body. Pt demonstrates very mild forward shoulder posture on R with decreased scapular activation/stabilization noted with attempts at R shoulder overhead elevation. R shoulder ROM essentially equivalent to L with exception of ER and strength only very mildly limited R vs L, however increased muscle tension & ttp noted in  anterior shoulder and pecs as well as posterior inferior shoulder complex. Pain limits pt from reaching or lifting objects away from body such as placing groceries or briefcase in his car. [place] will benefit from PT to address above deficits to maximize functional use of R UE.    Clinical Presentation  Stable    Clinical Decision Making  Low    PT Frequency  2x / week    PT Duration  4 weeks    PT Treatment/Interventions  Patient/family education;ADLs/Self Care Home Management;Neuromuscular re-education;Therapeutic exercise;Therapeutic activities;Electrical Stimulation;Moist Heat;Ultrasound;Iontophoresis 4mg /ml Dexamethasone;Manual techniques;Dry needling;Taping;Passive range of motion;Vasopneumatic Device;Cryotherapy    Consulted and Agree with Plan of Care  Patient       Patient will benefit from skilled therapeutic intervention in order to improve the following deficits and impairments:  Pain, Impaired UE functional use, Increased muscle spasms, Postural dysfunction, Decreased range of motion, Decreased strength  Visit Diagnosis: Acute pain of right shoulder  Stiffness of right shoulder, not elsewhere classified  Abnormal posture     Problem List Patient Active Problem List   Diagnosis Date Noted  . HNP (herniated nucleus pulposus), lumbar 02/12/2018  . Cryptogenic stroke (HCC) 07/11/2015  . TIA (transient ischemic attack) 04/28/2015  . Essential hypertension 04/28/2015  . Sleep apnea 04/28/2015  . Chronic back pain 04/28/2015  . Spinal stenosis of lumbar region 03/16/2015    Marry Guan, PT, MPT 06/18/2018, 12:38 PM  [facility] [address]  [address] [place], [place], [address] Phone: [phone]   Fax:  [phone]  Name: Christian Mcintyre MRN: [id number] Date of Birth: October 31, 1955

## 2018-06-20 ENCOUNTER — Ambulatory Visit: Payer: Managed Care, Other (non HMO) | Admitting: Physical Therapy

## 2018-06-20 ENCOUNTER — Encounter: Payer: Self-pay | Admitting: Physical Therapy

## 2018-06-20 DIAGNOSIS — R293 Abnormal posture: Secondary | ICD-10-CM

## 2018-06-20 DIAGNOSIS — M25511 Pain in right shoulder: Secondary | ICD-10-CM | POA: Diagnosis not present

## 2018-06-20 DIAGNOSIS — M25611 Stiffness of right shoulder, not elsewhere classified: Secondary | ICD-10-CM

## 2018-06-20 NOTE — Therapy (Signed)
[facility] [address]  [address] [place], [place], [address] Phone: [phone]   Fax:  [phone]  Physical Therapy Treatment  Patient Details  Name: Christian Mcintyre MRN: [id number] Date of Birth: 04-07-1956 Referring Provider (PT): Dennis Bast, MD   Encounter Date: 06/20/2018  PT End of Session - 06/20/18 1024    Visit Number  2    Number of Visits  8    Date for PT Re-Evaluation  07/16/18    Authorization Type  [employer]    PT Start Time  1024   Pt arrived late   PT Stop Time  1055   Pt needing to leave early for work mtg   PT Time Calculation (min)  31 min    Activity Tolerance  Patient tolerated treatment well    Behavior During Therapy  [facility] for tasks assessed/performed       Past Medical History:  Diagnosis Date  . Hypertension   . Sleep apnea    cpap  . Stroke [facility]) 2016   TIA    Past Surgical History:  Procedure Laterality Date  . BACK SURGERY     x2  . COLONOSCOPY    . EP IMPLANTABLE DEVICE N/A 05/31/2015   Procedure: Loop Recorder Insertion;  Surgeon: Hillis Range, MD;  Location: [facility] INVASIVE CV LAB;  Service: Cardiovascular;  Laterality: N/A;  . HERNIA REPAIR     umbical  . lumbar L4/5 fusion in 2008 Bilateral 2008  . TEE WITHOUT CARDIOVERSION N/A 05/16/2015   Procedure: TRANSESOPHAGEAL ECHOCARDIOGRAM (TEE);  Surgeon: Vesta Mixer, MD;  Location: [facility] ENDOSCOPY;  Service: Cardiovascular;  Laterality: N/A;    There were no vitals filed for this visit.  Subjective Assessment - 06/20/18 1026    Subjective  Pt reporting he experienced 2 recent incidences where he felt his R shoulder "pop" - at both times his shoulder was in a forward position (hugging someone & toweling off after a shower).    Patient Stated Goals  "be able to use my R arm w/o feeling like my shoulder will pop out"    Currently in Pain?  No/denies    Pain Onset  More than a month ago   4 months                       [facility] Adult PT Treatment/Exercise - 06/20/18 1024      Exercises   Exercises  Shoulder      Shoulder Exercises: Prone   Extension  Both;10 reps;Strengthening    Extension Limitations  I's standing leaning over green Pball on mat table - cues for scap retraction & slight upper thoracic extension    Horizontal ABduction 1  Both;10 reps;Strengthening    Horizontal ABduction 1 Limitations  T's standing leaning over green Pball on mat table - cues for scap retraction & slight upper thoracic extension    Horizontal ABduction 2 Limitations  Y's attempted standing leaning over green Pball on mat table but deferred due to R shoulder "popping"      Shoulder Exercises: Standing   Horizontal ABduction  Both;10 reps;Theraband;Strengthening    Theraband Level (Shoulder Horizontal ABduction)  Level 2 (Red)    Horizontal ABduction Limitations  standing against 1/2 FR to promote scap retraction    External Rotation  Right;15 reps;Theraband;Strengthening    Theraband Level (Shoulder External Rotation)  Level 2 (Red)    External Rotation Limitations  cues for scap retraction; towel roll  under elbow to maintain neutral shoulder    Internal Rotation  Right;15 reps;Theraband;Strengthening    Theraband Level (Shoulder Internal Rotation)  Level 2 (Red)    Internal Rotation Limitations  towel roll under elbow to maintain neutral shoulder    Extension  Both;15 reps;Theraband    Theraband Level (Shoulder Extension)  Level 3 (Green)    Extension Limitations  cues for scap retraction, avoiding shoulder hike at initiation of motion    Row  Both;15 reps;Theraband;Strengthening    Theraband Level (Shoulder Row)  Level 3 (Green)    Row Limitations  min cues for reinforcement of scap retraction & depression    Diagonals Limitations  attempted with red TB against 1/2 FR on wall - deferred due to R shoulder popping/pain      Shoulder Exercises: ROM/Strengthening   UBE (Upper Arm Bike)   L2.0 x 6 min (3' fwd/3' back)    Wall Pushups  15 reps    Wall Pushups Limitations  scapular push-up only    Other ROM/Strengthening Exercises  Serratus roll-up with 6" FR on wall 2 x 10 - 2nd set with looped red TB around forearms      Shoulder Exercises: Stretch   Corner Stretch  30 seconds;1 rep   each   Corner Stretch Limitations  3 way doorway stretch - cues to step through doorway rather than leaning fwd               PT Short Term Goals - 05/19/18 1709      PT SHORT TERM GOAL #1   Title  Pt will be independent with initial HEP    Status  Achieved        PT Long Term Goals - 06/20/18 1029      PT LONG TERM GOAL #1   Title  Pt will be independent with advanced/ongoing HEP     Status  On-going      PT LONG TERM GOAL #2   Title  R shoulder ER AROM equivalent to L w/o apprehension of subluxation    Status  On-going      PT LONG TERM GOAL #3   Title  R shoulder strength 5/5 with good scapular activation    Status  On-going      PT LONG TERM GOAL #4   Title  Pt will report ability to use R shoulder for normal daily tasks inlcuding placing briefcase/groceries in car w/o limitation due to pain or apprehension of subluxation    Status  On-going            Plan - 06/20/18 1029    Clinical Impression Statement  Treatment time limited due to pt late arrival, then requesting to leave early for work meeting. Reviewed initial HEP, providing correction for technique with doorway stretches and cueing for scapular activation while avoiding shoudler hiking during theraband rows/extension. Progressed exercises focusing on scapular stabilization due to visible winging of scapula on R - good tolerance for most exercises but limited in diagonal patterns due to sensation of R shoulder "popping out" and pain.    Rehab Potential  Good    PT Treatment/Interventions  Patient/family education;ADLs/Self Care Home Management;Neuromuscular re-education;Therapeutic exercise;Therapeutic  activities;Electrical Stimulation;Moist Heat;Ultrasound;Iontophoresis 4mg /ml Dexamethasone;Manual techniques;Dry needling;Taping;Passive range of motion;Vasopneumatic Device;Cryotherapy    Consulted and Agree with Plan of Care  Patient       Patient will benefit from skilled therapeutic intervention in order to improve the following deficits and impairments:  Pain, Impaired UE functional use, Increased  muscle spasms, Postural dysfunction, Decreased range of motion, Decreased strength  Visit Diagnosis: Acute pain of right shoulder  Stiffness of right shoulder, not elsewhere classified  Abnormal posture     Problem List Patient Active Problem List   Diagnosis Date Noted  . HNP (herniated nucleus pulposus), lumbar 02/12/2018  . Cryptogenic stroke (HCC) 07/11/2015  . TIA (transient ischemic attack) 04/28/2015  . Essential hypertension 04/28/2015  . Sleep apnea 04/28/2015  . Chronic back pain 04/28/2015  . Spinal stenosis of lumbar region 03/16/2015    Marry Guan, PT, MPT 06/20/2018, 12:27 PM  [facility] [address]  [address] [place], [place], [address] Phone: [phone]   Fax:  [phone]  Name: Christian Mcintyre MRN: [id number] Date of Birth: 05/03/1956

## 2018-06-24 ENCOUNTER — Ambulatory Visit: Payer: Managed Care, Other (non HMO)

## 2018-06-25 ENCOUNTER — Ambulatory Visit: Payer: Managed Care, Other (non HMO)

## 2018-06-25 DIAGNOSIS — M25611 Stiffness of right shoulder, not elsewhere classified: Secondary | ICD-10-CM

## 2018-06-25 DIAGNOSIS — R293 Abnormal posture: Secondary | ICD-10-CM

## 2018-06-25 DIAGNOSIS — M25511 Pain in right shoulder: Secondary | ICD-10-CM

## 2018-06-25 NOTE — Therapy (Signed)
[facility] [address]  [address] [place], [place], [address] Phone: [phone]   Fax:  [phone]  Physical Therapy Treatment  Patient Details  Name: Christian Mcintyre MRN: [id number] Date of Birth: 02/15/1956 Referring Provider (PT): Dennis Bast, MD   Encounter Date: 06/25/2018  PT End of Session - 06/25/18 0859    Visit Number  3    Number of Visits  8    Date for PT Re-Evaluation  07/16/18    Authorization Type  [employer]    PT Start Time  0855   Pt. arrived late    PT Stop Time  0930    PT Time Calculation (min)  35 min    Activity Tolerance  Patient tolerated treatment well    Behavior During Therapy  [facility] for tasks assessed/performed       Past Medical History:  Diagnosis Date  . Hypertension   . Sleep apnea    cpap  . Stroke [facility]) 2016   TIA    Past Surgical History:  Procedure Laterality Date  . BACK SURGERY     x2  . COLONOSCOPY    . EP IMPLANTABLE DEVICE N/A 05/31/2015   Procedure: Loop Recorder Insertion;  Surgeon: Hillis Range, MD;  Location: [facility] INVASIVE CV LAB;  Service: Cardiovascular;  Laterality: N/A;  . HERNIA REPAIR     umbical  . lumbar L4/5 fusion in 2008 Bilateral 2008  . TEE WITHOUT CARDIOVERSION N/A 05/16/2015   Procedure: TRANSESOPHAGEAL ECHOCARDIOGRAM (TEE);  Surgeon: Vesta Mixer, MD;  Location: [facility] ENDOSCOPY;  Service: Cardiovascular;  Laterality: N/A;    There were no vitals filed for this visit.  Subjective Assessment - 06/25/18 0856    Subjective  Pt. noting he is still getting R shoulder "popping" sensation with reacing activities forward and out to side.      Pertinent History  L2-L3 Lumbar fusion on 02/12/2018    Patient Stated Goals  "be able to use my R arm w/o feeling like my shoulder will pop out"    Currently in Pain?  No/denies    Pain Score  0-No pain   Pt. noting 6/10 pain at worst with "popping" with reaching activities.     Pain Location  Shoulder    Pain  Orientation  Right;Upper;Anterior    Pain Descriptors / Indicators  Sharp    Pain Type  Acute pain    Pain Onset  More than a month ago    Pain Frequency  Intermittent    Aggravating Factors   reaching/lifting overhead or away from body    Pain Relieving Factors  rest    Multiple Pain Sites  No                       [facility] Adult PT Treatment/Exercise - 06/25/18 0908      Shoulder Exercises: Standing   External Rotation  Theraband;Strengthening;Both;10 reps    Theraband Level (Shoulder External Rotation)  Level 2 (Red)    External Rotation Limitations  "W" row at door    Extension  Both;15 reps;Theraband    Theraband Level (Shoulder Extension)  Level 3 (Green)    Extension Limitations  cues for scap retraction, avoiding shoulder hike at initiation of motion    Row  Both;15 reps;Theraband;Strengthening    Theraband Level (Shoulder Row)  Level 3 (Green)    Row Limitations  min cues for reinforcement of scap retraction & depression  Shoulder Exercises: ROM/Strengthening   UBE (Upper Arm Bike)  L2.5 x 6 min (3' fwd/3' back)    Lat Pull  15 reps   2 sets; 2nd set with R only with single handle 10#   Lat Pull Limitations  15# - wide grip     Cybex Row  15 reps    Cybex Row Limitations  15# - low, Cues for scapular retraction/depression     Wall Pushups  20 reps   2 sets; well tolerated    Wall Pushups Limitations  scapular push-up only   2nd set with elbows/forearms on wall with red TB at elbows            PT Education - 06/25/18 1207    Education Details  HEP update     Person(s) Educated  Patient    Methods  Explanation;Demonstration;Verbal cues;Handout    Comprehension  Verbalized understanding;Returned demonstration;Verbal cues required;Need further instruction       PT Short Term Goals - 05/19/18 1709      PT SHORT TERM GOAL #1   Title  Pt will be independent with initial HEP    Status  Achieved        PT Long Term Goals - 06/20/18 1029       PT LONG TERM GOAL #1   Title  Pt will be independent with advanced/ongoing HEP     Status  On-going      PT LONG TERM GOAL #2   Title  R shoulder ER AROM equivalent to L w/o apprehension of subluxation    Status  On-going      PT LONG TERM GOAL #3   Title  R shoulder strength 5/5 with good scapular activation    Status  On-going      PT LONG TERM GOAL #4   Title  Pt will report ability to use R shoulder for normal daily tasks inlcuding placing briefcase/groceries in car w/o limitation due to pain or apprehension of subluxation    Status  On-going            Plan - 06/25/18 0900    Clinical Impression Statement  Christian Mcintyre arrived late to session today thus treatment time limited.  Tolerated strengthening progression of scapular stabilizers well today.  HEP updated with green TB issued to pt. for row/row/extension.  Only occasional R shoulder "popping" sensation reported at home with elevation type activities.  Unable to reproduce this pain in session however elevation activities not performed today.  Ended visit pain free thus modalities deferred.      PT Treatment/Interventions  Patient/family education;ADLs/Self Care Home Management;Neuromuscular re-education;Therapeutic exercise;Therapeutic activities;Electrical Stimulation;Moist Heat;Ultrasound;Iontophoresis 4mg /ml Dexamethasone;Manual techniques;Dry needling;Taping;Passive range of motion;Vasopneumatic Device;Cryotherapy    Consulted and Agree with Plan of Care  Patient       Patient will benefit from skilled therapeutic intervention in order to improve the following deficits and impairments:  Pain, Impaired UE functional use, Increased muscle spasms, Postural dysfunction, Decreased range of motion, Decreased strength  Visit Diagnosis: Acute pain of right shoulder  Stiffness of right shoulder, not elsewhere classified  Abnormal posture     Problem List Patient Active Problem List   Diagnosis Date Noted  . HNP (herniated  nucleus pulposus), lumbar 02/12/2018  . Cryptogenic stroke (HCC) 07/11/2015  . TIA (transient ischemic attack) 04/28/2015  . Essential hypertension 04/28/2015  . Sleep apnea 04/28/2015  . Chronic back pain 04/28/2015  . Spinal stenosis of lumbar region 03/16/2015    Christian Mcintyre, PTA 06/25/18  12:11 PM   [facility] [address]  [address] [place], [place], [address] Phone: [phone]   Fax:  [phone]  Name: Silvio Sausedo MRN: [id number] Date of Birth: 1955-11-15

## 2018-07-03 ENCOUNTER — Ambulatory Visit: Payer: Managed Care, Other (non HMO) | Admitting: Physical Therapy

## 2018-07-03 ENCOUNTER — Encounter: Payer: Self-pay | Admitting: Physical Therapy

## 2018-07-03 DIAGNOSIS — M25611 Stiffness of right shoulder, not elsewhere classified: Secondary | ICD-10-CM

## 2018-07-03 DIAGNOSIS — M25511 Pain in right shoulder: Secondary | ICD-10-CM

## 2018-07-03 DIAGNOSIS — R293 Abnormal posture: Secondary | ICD-10-CM

## 2018-07-03 NOTE — Therapy (Signed)
[facility] [address]  [address] [place], [place], [address] Phone: [phone]   Fax:  [phone]  Physical Therapy Treatment  Patient Details  Name: Christian Mcintyre MRN: [id number] Date of Birth: 22-Apr-1956 Referring Provider (PT): Dennis Bast, MD   Encounter Date: 07/03/2018  PT End of Session - 07/03/18 0812    Visit Number  4    Number of Visits  8    Date for PT Re-Evaluation  07/16/18    Authorization Type  [employer]    PT Start Time  [phone]   Pt arrived late   PT Stop Time  0845    PT Time Calculation (min)  33 min    Activity Tolerance  Patient tolerated treatment well    Behavior During Therapy  [facility] for tasks assessed/performed       Past Medical History:  Diagnosis Date  . Hypertension   . Sleep apnea    cpap  . Stroke [facility]) 2016   TIA    Past Surgical History:  Procedure Laterality Date  . BACK SURGERY     x2  . COLONOSCOPY    . EP IMPLANTABLE DEVICE N/A 05/31/2015   Procedure: Loop Recorder Insertion;  Surgeon: Hillis Range, MD;  [facility]: [facility] INVASIVE CV LAB;  Service: Cardiovascular;  Laterality: N/A;  . HERNIA REPAIR     umbical  . lumbar L4/5 fusion in 2008 Bilateral 2008  . TEE WITHOUT CARDIOVERSION N/A 05/16/2015   Procedure: TRANSESOPHAGEAL ECHOCARDIOGRAM (TEE);  Surgeon: Vesta Mixer, MD;  Location: [facility] ENDOSCOPY;  Service: Cardiovascular;  Laterality: N/A;    There were no vitals filed for this visit.  Subjective Assessment - 07/03/18 0814    Subjective  Pt reporting he noted his shoulder popping this morning which reaching to hang his shirt up on the closet rack.    Patient Stated Goals  "be able to use my R arm w/o feeling like my shoulder will pop out"    Currently in Pain?  No/denies    Pain Onset  --                       [facility] Adult PT Treatment/Exercise - 07/03/18 0812      Exercises   Exercises  Shoulder      Shoulder Exercises: Prone   Other Prone Exercises   Prone over edge of mat table - serratus rock on inverted BOSU x10      Shoulder Exercises: Standing   Row  Both;15 reps;Strengthening   2 sets   Row Limitations  TRX low & mid rows - cues for scap retraction    Diagonals  Right;10 reps;Theraband    Theraband Level (Shoulder Diagonals)  Level 1 (Yellow)    Diagonals Limitations  D1/D2 extension      Shoulder Exercises: ROM/Strengthening   UBE (Upper Arm Bike)  L3.0 x 6 min (3' fwd/3' back)    Lat Pull  15 reps    Lat Pull Limitations  20# - wide grip     Wall Pushups  15 reps    Wall Pushups Limitations  wall push-up plus on orange Pball    Ball on Wall  R shoulder CW/CCW circles at 90 dg flexion & scaption/abduction x15 each    Other ROM/Strengthening Exercises  Serratus roll-up with orange Pball on wall x15 with looped green TB around forearms for ER isometric  PT Short Term Goals - 05/19/18 1709      PT SHORT TERM GOAL #1   Title  Pt will be independent with initial HEP    Status  Achieved        PT Long Term Goals - 06/20/18 1029      PT LONG TERM GOAL #1   Title  Pt will be independent with advanced/ongoing HEP     Status  On-going      PT LONG TERM GOAL #2   Title  R shoulder ER AROM equivalent to L w/o apprehension of subluxation    Status  On-going      PT LONG TERM GOAL #3   Title  R shoulder strength 5/5 with good scapular activation    Status  On-going      PT LONG TERM GOAL #4   Title  Pt will report ability to use R shoulder for normal daily tasks inlcuding placing briefcase/groceries in car w/o limitation due to pain or apprehension of subluxation    Status  On-going            Plan - 07/03/18 0816    Clinical Impression Statement  Christian Mcintyre continues to demonstrate decreased R scapular control with decreased scapular activiation unless actively cued for scapular retraction, therefore majority of exercises today focusing on scapular stabilization. Attempted extension diagonals with  yellow TB with pt reporting "feeling like shoulder should be popping" but no actual "popping" occured, Pt will continue to benefit from skilled PT to impeove shoulder stability and decrease instances of subluxation/pain, however overall degree of benefit reduced by pt's chronic lateness to appointments.    Rehab Potential  Good    PT Treatment/Interventions  Patient/family education;ADLs/Self Care Home Management;Neuromuscular re-education;Therapeutic exercise;Therapeutic activities;Electrical Stimulation;Moist Heat;Ultrasound;Iontophoresis 4mg /ml Dexamethasone;Manual techniques;Dry needling;Taping;Passive range of motion;Vasopneumatic Device;Cryotherapy    Consulted and Agree with Plan of Care  Patient       Patient will benefit from skilled therapeutic intervention in order to improve the following deficits and impairments:  Pain, Impaired UE functional use, Increased muscle spasms, Postural dysfunction, Decreased range of motion, Decreased strength  Visit Diagnosis: Acute pain of right shoulder  Stiffness of right shoulder, not elsewhere classified  Abnormal posture     Problem List Patient Active Problem List   Diagnosis Date Noted  . HNP (herniated nucleus pulposus), lumbar 02/12/2018  . Cryptogenic stroke (HCC) 07/11/2015  . TIA (transient ischemic attack) 04/28/2015  . Essential hypertension 04/28/2015  . Sleep apnea 04/28/2015  . Chronic back pain 04/28/2015  . Spinal stenosis of lumbar region 03/16/2015    Christian Mcintyre, PT, MPT 07/03/2018, 8:58 AM  [facility] [address]  [address] [place], [place], [address] Phone: [phone]   Fax:  [phone]  Name: Christian Mcintyre MRN: [id number] Date of Birth: [birth year]

## 2018-07-04 ENCOUNTER — Ambulatory Visit (HOSPITAL_BASED_OUTPATIENT_CLINIC_OR_DEPARTMENT_OTHER)
Admission: RE | Admit: 2018-07-04 | Discharge: 2018-07-04 | Disposition: A | Discharge status: Home / Self Care | Payer: Managed Care, Other (non HMO) | Source: Ambulatory Visit | Attending: Neurosurgery | Admitting: Neurosurgery

## 2018-07-04 ENCOUNTER — Other Ambulatory Visit (HOSPITAL_BASED_OUTPATIENT_CLINIC_OR_DEPARTMENT_OTHER): Payer: Self-pay | Admitting: Neurosurgery

## 2018-07-04 DIAGNOSIS — M5440 Lumbago with sciatica, unspecified side: Secondary | ICD-10-CM | POA: Diagnosis not present

## 2018-07-09 ENCOUNTER — Encounter: Payer: Self-pay | Admitting: Physical Therapy

## 2018-07-09 ENCOUNTER — Ambulatory Visit: Payer: Managed Care, Other (non HMO) | Attending: Internal Medicine | Admitting: Physical Therapy

## 2018-07-09 DIAGNOSIS — M25511 Pain in right shoulder: Secondary | ICD-10-CM | POA: Insufficient documentation

## 2018-07-09 DIAGNOSIS — R293 Abnormal posture: Secondary | ICD-10-CM | POA: Diagnosis present

## 2018-07-09 DIAGNOSIS — M25611 Stiffness of right shoulder, not elsewhere classified: Secondary | ICD-10-CM | POA: Insufficient documentation

## 2018-07-09 NOTE — Therapy (Signed)
[facility] [address]  [address] [place], [place], [address] Phone: [phone]   Fax:  [phone]  Physical Therapy Treatment  Patient Details  Name: Christian Mcintyre MRN: [id number] Date of Birth: 1955-10-02 Referring Provider (PT): Dennis Bast, MD   Encounter Date: 07/09/2018  PT End of Session - 07/09/18 0853    Visit Number  5    Number of Visits  8    Date for PT Re-Evaluation  07/16/18    Authorization Type  [employer]    PT Start Time  [phone]   Pt arrived late   PT Stop Time  0931    PT Time Calculation (min)  38 min    Activity Tolerance  Patient tolerated treatment well    Behavior During Therapy  [facility] for tasks assessed/performed       Past Medical History:  Diagnosis Date  . Hypertension   . Sleep apnea    cpap  . Stroke [facility]) 2016   TIA    Past Surgical History:  Procedure Laterality Date  . BACK SURGERY     x2  . COLONOSCOPY    . EP IMPLANTABLE DEVICE N/A 05/31/2015   Procedure: Loop Recorder Insertion;  Surgeon: Hillis Range, MD;  Location: [facility] INVASIVE CV LAB;  Service: Cardiovascular;  Laterality: N/A;  . HERNIA REPAIR     umbical  . lumbar L4/5 fusion in 2008 Bilateral 2008  . TEE WITHOUT CARDIOVERSION N/A 05/16/2015   Procedure: TRANSESOPHAGEAL ECHOCARDIOGRAM (TEE);  Surgeon: Vesta Mixer, MD;  Location: [facility] ENDOSCOPY;  Service: Cardiovascular;  Laterality: N/A;    There were no vitals filed for this visit.  Subjective Assessment - 07/09/18 0854    Subjective  Pt reporting he has been able to progress theraband exercises to green at home.    Patient Stated Goals  "be able to use my R arm w/o feeling like my shoulder will pop out"    Currently in Pain?  No/denies                       [facility] Adult PT Treatment/Exercise - 07/09/18 0853      Exercises   Exercises  Shoulder      Shoulder Exercises: Prone   Extension  Both;10 reps;Strengthening;Weights    Extension Weight (lbs)   1    Extension Limitations  I's over green Pball - cues for scap retraction & slight upper thoracic extension    External Rotation  Both;10 reps;Strengthening;Weights    External Rotation Weight (lbs)  1    External Rotation Limitations  W's over green Pball - cues for scap retraction & slight upper thoracic extension    Horizontal ABduction 1  Both;10 reps;Strengthening;Weights    Horizontal ABduction 1 Weight (lbs)  1    Horizontal ABduction 1 Limitations  T's over green Pball - cues for scap retraction    Horizontal ABduction 2 Limitations  Y's attempted over green Pball but deferred due to R shoulder "popping"    Other Prone Exercises  Quadruped serratus rock on inverted BOSU x10      Shoulder Exercises: Standing   External Rotation  Both;15 reps;Theraband;Strengthening    Theraband Level (Shoulder External Rotation)  Level 3 (Green)    External Rotation Limitations  standing against 1/2 FR to promote scap retraction    Diagonals  Both;10 reps;Theraband;Strengthening    Theraband Level (Shoulder Diagonals)  Level 2 (Red)    Diagonals Limitations  standing  against 1/2 FR to promote scap retraction - no evidence of R shoulder popping/pain      Shoulder Exercises: Therapy Ball   Flexion  Right;10 reps    Flexion Limitations  orange Pball roll-up on wall + R hand lift-off with 2# cuff wt at wrist    Scaption  Right;10 reps    Scaption Limitations  orange Pball roll-up on wall + R hand lift-off with 2# cuff wt at wrist      Shoulder Exercises: ROM/Strengthening   UBE (Upper Arm Bike)  L3.0 x 6 min (3' fwd/3' back)    "W" Arms  W row with green TB x15    Other ROM/Strengthening Exercises  Serratus roll-up with orange Pball on wall x15 with looped green TB around forearms for ER isometric      Shoulder Exercises: Body Blade   Flexion  15 seconds;3 reps    Flexion Limitations  static at 90 dg    ABduction  15 seconds;3 reps    ABduction Limitations  static @ 90 dg in scapular plane     External Rotation  15 seconds;3 reps    External Rotation Limitations  dynamic through available range with neutral shoulder               PT Short Term Goals - 05/19/18 1709      PT SHORT TERM GOAL #1   Title  Pt will be independent with initial HEP    Status  Achieved        PT Long Term Goals - 06/20/18 1029      PT LONG TERM GOAL #1   Title  Pt will be independent with advanced/ongoing HEP     Status  On-going      PT LONG TERM GOAL #2   Title  R shoulder ER AROM equivalent to L w/o apprehension of subluxation    Status  On-going      PT LONG TERM GOAL #3   Title  R shoulder strength 5/5 with good scapular activation    Status  On-going      PT LONG TERM GOAL #4   Title  Pt will report ability to use R shoulder for normal daily tasks inlcuding placing briefcase/groceries in car w/o limitation due to pain or apprehension of subluxation    Status  On-going            Plan - 07/09/18 0856    Clinical Impression Statement  Christian Mcintyre tolerating progression of theraband resistance and now able to perform some of diagonal pattern activities w/o evidence of pain or popping sensation but still limited tolerance for prone Y's. No recent reports of shoulder popping or pain with activities at home. Pt progressing well toward goals    Rehab Potential  Good    PT Treatment/Interventions  Patient/family education;ADLs/Self Care Home Management;Neuromuscular re-education;Therapeutic exercise;Therapeutic activities;Electrical Stimulation;Moist Heat;Ultrasound;Iontophoresis 4mg /ml Dexamethasone;Manual techniques;Dry needling;Taping;Passive range of motion;Vasopneumatic Device;Cryotherapy    Consulted and Agree with Plan of Care  Patient       Patient will benefit from skilled therapeutic intervention in order to improve the following deficits and impairments:  Pain, Impaired UE functional use, Increased muscle spasms, Postural dysfunction, Decreased range of motion, Decreased  strength  Visit Diagnosis: Acute pain of right shoulder  Stiffness of right shoulder, not elsewhere classified  Abnormal posture     Problem List Patient Active Problem List   Diagnosis Date Noted  . HNP (herniated nucleus pulposus), lumbar 02/12/2018  . Cryptogenic stroke (  HCC) 07/11/2015  . TIA (transient ischemic attack) 04/28/2015  . Essential hypertension 04/28/2015  . Sleep apnea 04/28/2015  . Chronic back pain 04/28/2015  . Spinal stenosis of lumbar region 03/16/2015    Marry Guan, PT, MPT 07/09/2018, 10:13 AM  [facility] [address]  [address] [place], [place], [address] Phone: [phone]   Fax:  [phone]  Name: Christian Mcintyre MRN: [id number] Date of Birth: 1956-06-20

## 2018-07-11 ENCOUNTER — Ambulatory Visit: Payer: Managed Care, Other (non HMO)

## 2018-07-11 DIAGNOSIS — M25611 Stiffness of right shoulder, not elsewhere classified: Secondary | ICD-10-CM

## 2018-07-11 DIAGNOSIS — M25511 Pain in right shoulder: Secondary | ICD-10-CM

## 2018-07-11 NOTE — Therapy (Signed)
[facility] [address]  [address] [place], [place], [address] Phone: [phone]   Fax:  [phone]  Physical Therapy Treatment  Patient Details  Name: Christian Mcintyre MRN: [id number] Date of Birth: [birth year] Referring Provider (PT): Dennis Bast, MD   Encounter Date: 07/11/2018  PT End of Session - 07/11/18 0817    Visit Number  6    Number of Visits  8    Date for PT Re-Evaluation  07/16/18    Authorization Type  [employer]    PT Start Time  0813   Pt. arrived 13 min late to session    PT Stop Time  0841    PT Time Calculation (min)  28 min    Activity Tolerance  Patient tolerated treatment well    Behavior During Therapy  [facility] for tasks assessed/performed       Past Medical History:  Diagnosis Date  . Hypertension   . Sleep apnea    cpap  . Stroke [facility]) 2016   TIA    Past Surgical History:  Procedure Laterality Date  . BACK SURGERY     x2  . COLONOSCOPY    . EP IMPLANTABLE DEVICE N/A 05/31/2015   Procedure: Loop Recorder Insertion;  Surgeon: Hillis Range, MD;  Location: [facility] INVASIVE CV LAB;  Service: Cardiovascular;  Laterality: N/A;  . HERNIA REPAIR     umbical  . lumbar L4/5 fusion in 2008 Bilateral 2008  . TEE WITHOUT CARDIOVERSION N/A 05/16/2015   Procedure: TRANSESOPHAGEAL ECHOCARDIOGRAM (TEE);  Surgeon: Vesta Mixer, MD;  Location: [facility] ENDOSCOPY;  Service: Cardiovascular;  Laterality: N/A;    There were no vitals filed for this visit.  Subjective Assessment - 07/11/18 0815    Subjective  Pt. reporting feeling stiff in back today however R shoulder has been feeling well.      Pertinent History  L2-L3 Lumbar fusion on 02/12/2018    Patient Stated Goals  "be able to use my R arm w/o feeling like my shoulder will pop out"    Currently in Pain?  No/denies    Pain Score  0-No pain    Multiple Pain Sites  No                       [facility] Adult PT Treatment/Exercise - 07/11/18 0826       Shoulder Exercises: Supine   Protraction  20 reps    Protraction Weight (lbs)  4    Protraction Limitations  Cues for proper motion    2 sets      Shoulder Exercises: Prone   Flexion  Right;15 reps    Flexion Limitations  Tactile cues for scapular upward rotation    popping x 3 at end range thus instructed to avoid end range      Shoulder Exercises: Sidelying   External Rotation  Right;15 reps;Strengthening;Weights    External Rotation Weight (lbs)  2    External Rotation Limitations  Scapular retraction cues provided       Shoulder Exercises: Standing   Extension  Both;10 reps;Theraband    Theraband Level (Shoulder Extension)  Level 4 (Blue)    Other Standing Exercises  B shoulder D2 flexion       Shoulder Exercises: ROM/Strengthening   UBE (Upper Arm Bike)  L3.0 x 6 min (3' fwd/3' back)    Cybex Row  15 reps    Cybex Row Limitations  20# - narrow handles  Cues to avoid excessive scap. elevation    Wall Pushups  15 reps    Wall Pushups Limitations  wall push-up plus on orange Pball    Pushups  15 reps    Pushups Limitations  Serratus punch on wall       Shoulder Exercises: Stretch   Corner Stretch  30 seconds;1 rep    Corner Stretch Limitations  3 way doorway stretch                PT Short Term Goals - 05/19/18 1709      PT SHORT TERM GOAL #1   Title  Pt will be independent with initial HEP    Status  Achieved        PT Long Term Goals - 06/20/18 1029      PT LONG TERM GOAL #1   Title  Pt will be independent with advanced/ongoing HEP     Status  On-going      PT LONG TERM GOAL #2   Title  R shoulder ER AROM equivalent to L w/o apprehension of subluxation    Status  On-going      PT LONG TERM GOAL #3   Title  R shoulder strength 5/5 with good scapular activation    Status  On-going      PT LONG TERM GOAL #4   Title  Pt will report ability to use R shoulder for normal daily tasks inlcuding placing briefcase/groceries in car w/o limitation due to  pain or apprehension of subluxation    Status  On-going            Plan - 07/11/18 0829    Clinical Impression Statement  Pt. arrived 13 min late to session thus treatment time limited.  Pt. noting R shoulder "popping" less frequently now however still having this sensation when reaching to second shelf to hang up clothes in closet.  Scapular strengthening advanced in session today with special focus on scapular upward rotators and cueing to reduce UT involvement.  Pt. still demonstrating poor scapular mechanics with elevation activities and "popping" reproduced x 3 in session with prone "y" flexion activity.  Pt. does feel he is improving with therapy at this point and notes improved strength at R shoulder with other daily tasks.  Progressing well toward goals.      PT Treatment/Interventions  Patient/family education;ADLs/Self Care Home Management;Neuromuscular re-education;Therapeutic exercise;Therapeutic activities;Electrical Stimulation;Moist Heat;Ultrasound;Iontophoresis 4mg /ml Dexamethasone;Manual techniques;Dry needling;Taping;Passive range of motion;Vasopneumatic Device;Cryotherapy    Consulted and Agree with Plan of Care  Patient       Patient will benefit from skilled therapeutic intervention in order to improve the following deficits and impairments:  Pain, Impaired UE functional use, Increased muscle spasms, Postural dysfunction, Decreased range of motion, Decreased strength  Visit Diagnosis: Acute pain of right shoulder  Stiffness of right shoulder, not elsewhere classified     Problem List Patient Active Problem List   Diagnosis Date Noted  . HNP (herniated nucleus pulposus), lumbar 02/12/2018  . Cryptogenic stroke (HCC) 07/11/2015  . TIA (transient ischemic attack) 04/28/2015  . Essential hypertension 04/28/2015  . Sleep apnea 04/28/2015  . Chronic back pain 04/28/2015  . Spinal stenosis of lumbar region 03/16/2015    Kermit Balo, PTA 07/11/18 12:16  PM   [facility] [address]  [address] [place], [place], [address] Phone: [phone]   Fax:  [phone]  Name: Christian Mcintyre MRN: [id number] Date of Birth: 11-May-1956

## 2018-07-14 ENCOUNTER — Ambulatory Visit: Payer: Managed Care, Other (non HMO)

## 2018-07-17 ENCOUNTER — Ambulatory Visit: Payer: Managed Care, Other (non HMO)

## 2018-07-17 DIAGNOSIS — M25511 Pain in right shoulder: Secondary | ICD-10-CM | POA: Diagnosis not present

## 2018-07-17 DIAGNOSIS — M25611 Stiffness of right shoulder, not elsewhere classified: Secondary | ICD-10-CM

## 2018-07-17 DIAGNOSIS — R293 Abnormal posture: Secondary | ICD-10-CM

## 2018-07-17 NOTE — Therapy (Signed)
[facility] Outpatient Rehabilitation [facility] [address]  [address] [place], [place], [address] Phone: [phone]   Fax:  [phone]  Physical Therapy Treatment  Patient Details  Name: Christian Mcintyre MRN: [id number] Date of Birth: 04-20-1956 Referring Provider (PT): Dennis Bast, MD   Encounter Date: 07/17/2018  PT End of Session - 07/17/18 0821    Visit Number  7    Number of Visits  8    Date for PT Re-Evaluation  07/16/18    Authorization Type  [employer]    PT Start Time  0818    PT Stop Time  0841    PT Time Calculation (min)  23 min    Activity Tolerance  Patient tolerated treatment well    Behavior During Therapy  [facility] for tasks assessed/performed       Past Medical History:  Diagnosis Date  . Hypertension   . Sleep apnea    cpap  . Stroke [facility]) 2016   TIA    Past Surgical History:  Procedure Laterality Date  . BACK SURGERY     x2  . COLONOSCOPY    . EP IMPLANTABLE DEVICE N/A 05/31/2015   Procedure: Loop Recorder Insertion;  Surgeon: Hillis Range, MD;  [facility] INVASIVE CV LAB;  Service: Cardiovascular;  Laterality: N/A;  . HERNIA REPAIR     umbical  . lumbar L4/5 fusion in 2008 Bilateral 2008  . TEE WITHOUT CARDIOVERSION N/A 05/16/2015   Procedure: TRANSESOPHAGEAL ECHOCARDIOGRAM (TEE);  Surgeon: Vesta Mixer, MD;  Location: [facility] ENDOSCOPY;  Service: Cardiovascular;  Laterality: N/A;    There were no vitals filed for this visit.  Subjective Assessment - 07/17/18 0819    Subjective  Pt. reportins "popping" x 5 while wood working over weekend with R shoulder.    Pertinent History  L2-L3 Lumbar fusion on 02/12/2018    Patient Stated Goals  "be able to use my R arm w/o feeling like my shoulder will pop out"    Currently in Pain?  No/denies    Pain Score  0-No pain   up to 6/10 while using R arm in scaption motion standing while using reciprocating saw over weekend    Pain Location  Shoulder    Pain Orientation  Right;Upper;Anterior    Pain Descriptors / Indicators  Sharp   "popping"   Pain Type  Acute pain    Pain Onset  More than a month ago    Pain Frequency  Intermittent    Aggravating Factors   using saw while wood working     Pain Relieving Factors  rest     Multiple Pain Sites  No                       [facility] Adult PT Treatment/Exercise - 07/17/18 0827      Shoulder Exercises: Sidelying   External Rotation  Right;10 reps   2 sets    External Rotation Weight (lbs)  3    External Rotation Limitations  Scapular retraction cues provided       Shoulder Exercises: Standing   Flexion  Right;10 reps;Theraband    Theraband Level (Shoulder Flexion)  Level 2 (Red)    Flexion Limitations  Improved scapular mechanics able to avoid excessive scapular elevation     ABduction  Right;15 reps;Strengthening;Theraband    Theraband Level (Shoulder ABduction)  Level 2 (Red)    ABduction Limitations  scaption; improved scapular mechanics avoiding excessive elevation  Row  Both;15 reps;Strengthening    Row Limitations  TRX low & mid rows - cues for scap retraction      Shoulder Exercises: ROM/Strengthening   UBE (Upper Arm Bike)  L3.0 x 6 min (3' fwd/3' back)      Shoulder Exercises: Stretch   Other Shoulder Stretches  R SCM stretch 2 x 20 sec seated       Manual Therapy   Manual Therapy  Passive ROM    Passive ROM  R SCM, pecs stretch 2 x 20 sec manuall with therapist    Per complaint of mid R clavicle "popping" "tightness"              PT Short Term Goals - 05/19/18 1709      PT SHORT TERM GOAL #1   Title  Pt will be independent with initial HEP    Status  Achieved        PT Long Term Goals - 06/20/18 1029      PT LONG TERM GOAL #1   Title  Pt will be independent with advanced/ongoing HEP     Status  On-going      PT LONG TERM GOAL #2   Title  R shoulder ER AROM equivalent to L w/o apprehension of subluxation    Status  On-going      PT LONG TERM GOAL #3   Title  R shoulder  strength 5/5 with good scapular activation    Status  On-going      PT LONG TERM GOAL #4   Title  Pt will report ability to use R shoulder for normal daily tasks inlcuding placing briefcase/groceries in car w/o limitation due to pain or apprehension of subluxation    Status  On-going            Plan - 07/17/18 1610    Clinical Impression Statement  Pt. arrived 19 min late to session thus treatment time limited.  Pt. noting, "popping" sensation at anterior shoulder x 5 while wood working over weekend.  Pt. noting 20-30 % improvement in shoulder since starting with therapy.  Feels "popping sensation less frequent however, "still happens routinely".  Session focusing on scapular/RTC strengthening with cueing for proper scapular motion with pt. tolerating well without pain or popping.  Pt. made aware that next session is pt.'s last session in current POC.  Will plan to address goals in coming visit.      PT Treatment/Interventions  Patient/family education;ADLs/Self Care Home Management;Neuromuscular re-education;Therapeutic exercise;Therapeutic activities;Electrical Stimulation;Moist Heat;Ultrasound;Iontophoresis 4mg /ml Dexamethasone;Manual techniques;Dry needling;Taping;Passive range of motion;Vasopneumatic Device;Cryotherapy    Consulted and Agree with Plan of Care  Patient       Patient will benefit from skilled therapeutic intervention in order to improve the following deficits and impairments:  Pain, Impaired UE functional use, Increased muscle spasms, Postural dysfunction, Decreased range of motion, Decreased strength  Visit Diagnosis: Acute pain of right shoulder  Stiffness of right shoulder, not elsewhere classified  Abnormal posture     Problem List Patient Active Problem List   Diagnosis Date Noted  . HNP (herniated nucleus pulposus), lumbar 02/12/2018  . Cryptogenic stroke (HCC) 07/11/2015  . TIA (transient ischemic attack) 04/28/2015  . Essential hypertension  04/28/2015  . Sleep apnea 04/28/2015  . Chronic back pain 04/28/2015  . Spinal stenosis of lumbar region 03/16/2015    Kermit Balo, PTA 07/17/18 12:59 PM   [facility] Outpatient Rehabilitation [facility] [address]  [address] [place], [place],  [id number] Phone: [phone]   Fax:  [phone]  Name: Christian Mcintyre MRN: [id number] Date of Birth: September 29, 1955

## 2018-07-22 ENCOUNTER — Ambulatory Visit: Payer: Managed Care, Other (non HMO) | Admitting: Physical Therapy

## 2018-07-22 ENCOUNTER — Encounter: Payer: Self-pay | Admitting: Physical Therapy

## 2018-07-22 DIAGNOSIS — M25611 Stiffness of right shoulder, not elsewhere classified: Secondary | ICD-10-CM

## 2018-07-22 DIAGNOSIS — M25511 Pain in right shoulder: Secondary | ICD-10-CM | POA: Diagnosis not present

## 2018-07-22 DIAGNOSIS — R293 Abnormal posture: Secondary | ICD-10-CM

## 2018-07-22 NOTE — Therapy (Signed)
[facility] [address]  [place], [place], [address] Phone: [phone]   Fax:  [phone]  Physical Therapy Treatment  Patient Details  Name: Christian Mcintyre MRN: [id number] Date of Birth: [birth year] Referring Provider (PT): Yong Channel, MD   Encounter Date: 07/22/2018  PT End of Session - 07/22/18 0851    Visit Number  8    Number of Visits  8    Date for PT Re-Evaluation  07/16/18    Authorization Type  [employer]    PT Start Time  [phone]   Pt arrived late   PT Stop Time  0927    PT Time Calculation (min)  36 min    Activity Tolerance  Patient tolerated treatment well    Behavior During Therapy  [facility] for tasks assessed/performed       Past Medical History:  Diagnosis Date  . Hypertension   . Sleep apnea    cpap  . Stroke [facility]) 2016   TIA    Past Surgical History:  Procedure Laterality Date  . BACK SURGERY     x2  . COLONOSCOPY    . EP IMPLANTABLE DEVICE N/A 05/31/2015   Procedure: Loop Recorder Insertion;  Surgeon: Thompson Grayer, MD;  Location: [place] CV LAB;  Service: Cardiovascular;  Laterality: N/A;  . HERNIA REPAIR     umbical  . lumbar L4/5 fusion in 2008 Bilateral 2008  . TEE WITHOUT CARDIOVERSION N/A 05/16/2015   Procedure: TRANSESOPHAGEAL ECHOCARDIOGRAM (TEE);  Surgeon: Thayer Headings, MD;  Location: [place];  Service: Cardiovascular;  Laterality: N/A;    There were no vitals filed for this visit.  Subjective Assessment - 07/22/18 0852    Subjective  Pt reports no new issues since R shoulder "popping" while using reciprocating saw during wood working project weekend before last.    Pertinent History  L2-L3 Lumbar fusion on 02/12/2018    Patient Stated Goals  "be able to use my R arm w/o feeling like my shoulder will pop out"    Currently in Pain?  No/denies    Pain Onset  More than a month ago         [facility] PT Assessment - 07/22/18 0851      Assessment   Medical Diagnosis  R  acute shoulder pain    Referring Provider (PT)  Yong Channel, MD    Onset Date/Surgical Date  --   ~5 months ago   Hand Dominance  Right    Next MD Visit  ~2 months - not specific to shoulder      Observation/Other Assessments   Focus on Therapeutic Outcomes (FOTO)   Shoulder - 68% (32% limitation)      AROM   Right Shoulder Internal Rotation  80 Degrees    Right Shoulder External Rotation  92 Degrees    Left Shoulder Internal Rotation  82 Degrees    Left Shoulder External Rotation  98 Degrees      Strength   Right Shoulder Flexion  5/5    Right Shoulder ABduction  5/5    Right Shoulder Internal Rotation  5/5    Right Shoulder External Rotation  5/5    Left Shoulder Flexion  5/5    Left Shoulder ABduction  5/5    Left Shoulder Internal Rotation  5/5    Left Shoulder External Rotation  5/5  [facility] Adult PT Treatment/Exercise - 07/22/18 0851      Exercises   Exercises  Shoulder      Shoulder Exercises: Standing   External Rotation  Both;15 reps;Theraband;Strengthening    Theraband Level (Shoulder External Rotation)  Level 2 (Red)    External Rotation Limitations  90 dg row + ER    Diagonals  Both;10 reps;Theraband;Strengthening    Theraband Level (Shoulder Diagonals)  Level 2 (Red)    Diagonals Limitations  standing against doorframe to promote scap retraction - no evidence of R shoulder popping/pain    Other Standing Exercises  Lower trap row with red TB x 15      Shoulder Exercises: ROM/Strengthening   UBE (Upper Arm Bike)  L3.0 x 6 min (3' fwd/3' back)    "W" Arms  W row with green TB x15    Ball on Wall  R shoulder CW/CCW circles at 90 dg flexion & scaption/abduction x20 each             PT Education - 07/22/18 0926    Education Details  HEP review/update    Person(s) Educated  Patient    Methods  Explanation;Demonstration;Handout    Comprehension  Verbalized understanding;Returned demonstration          PT Long Term Goals  - 07/22/18 0855      PT LONG TERM GOAL #1   Title  Pt will be independent with advanced/ongoing HEP     Status  Achieved      PT LONG TERM GOAL #2   Title  R shoulder ER AROM equivalent to L w/o apprehension of subluxation    Status  Achieved      PT LONG TERM GOAL #3   Title  R shoulder strength 5/5 with good scapular activation    Status  Achieved      PT LONG TERM GOAL #4   Title  Pt will report ability to use R shoulder for normal daily tasks inlcuding placing briefcase/groceries in car w/o limitation due to pain or apprehension of subluxation    Status  Unable to assess   Pt reports using compensation (keeping elbow at side) and has not recently attempted reaching across to passenger's seat with briefcase ot groceries.           Plan - 07/22/18 0855    Clinical Impression Statement  Christian Mcintyre has demonstrated good progress with PT for his R shoulder with restoration of full R shoulder AROM and strength 5/5 without apprehension of shoulder subluxation. FOTO exceeded predicted level with survey revealing only 32% limitation (predicted 34% limitation). HEP reviewed/updated and pt reports confidence with continuing on his own with HEP. All LTG's met except unable to assess ability to use R arm in normal functional manner with activities such as placing briefcase or groceries in car as pt reports he continues to use substitution/compenstation with these activities and has not recently attempted normal patterns, but he does report he has been able to reach from front seat to back seta in car w/o issues.    Rehab Potential  Good    PT Treatment/Interventions  Patient/family education;ADLs/Self Care Home Management;Neuromuscular re-education;Therapeutic exercise;Therapeutic activities;Electrical Stimulation;Moist Heat;Ultrasound;Iontophoresis 23m/ml Dexamethasone;Manual techniques;Dry needling;Taping;Passive range of motion;Vasopneumatic Device;Cryotherapy    PT Next Visit Plan  Discharge     Consulted and Agree with Plan of Care  Patient       Patient will benefit from skilled therapeutic intervention in order to improve the following deficits and impairments:  Pain, Impaired  UE functional use, Increased muscle spasms, Postural dysfunction, Decreased range of motion, Decreased strength  Visit Diagnosis: Acute pain of right shoulder  Stiffness of right shoulder, not elsewhere classified  Abnormal posture     Problem List Patient Active Problem List   Diagnosis Date Noted  . HNP (herniated nucleus pulposus), lumbar 02/12/2018  . Cryptogenic stroke ([place]) 07/11/2015  . TIA (transient ischemic attack) 04/28/2015  . Essential hypertension 04/28/2015  . Sleep apnea 04/28/2015  . Chronic back pain 04/28/2015  . Spinal stenosis of lumbar region 03/16/2015    PHYSICAL THERAPY DISCHARGE SUMMARY  Visits from Start of Care: 8  Current functional level related to goals / functional outcomes:   Refer to above clinical impression.   Remaining deficits:   As above.   Education / Equipment:   HEP  Plan: Patient agrees to discharge.  Patient goals were mostly met. Patient is being discharged due to meeting the stated rehab goals.  ?????      Percival Spanish, PT, MPT 07/22/2018, 9:43 AM  [facility] [address]  [place], [place], [address] Phone: [phone]   Fax:  [phone]  Name: Christian Mcintyre MRN: [id number] Date of Birth: 1956/05/08

## 2020-11-09 ENCOUNTER — Other Ambulatory Visit: Payer: Self-pay | Admitting: Neurosurgery

## 2020-12-02 ENCOUNTER — Encounter (HOSPITAL_COMMUNITY): Payer: Self-pay | Admitting: Neurosurgery

## 2020-12-02 ENCOUNTER — Other Ambulatory Visit: Payer: Self-pay

## 2020-12-02 ENCOUNTER — Other Ambulatory Visit (HOSPITAL_COMMUNITY)
Admission: RE | Admit: 2020-12-02 | Discharge: 2020-12-02 | Disposition: A | Discharge status: Home / Self Care | Payer: BC Managed Care – PPO | Source: Ambulatory Visit | Attending: Neurosurgery | Admitting: Neurosurgery

## 2020-12-02 ENCOUNTER — Other Ambulatory Visit: Payer: Self-pay | Admitting: Neurosurgery

## 2020-12-02 DIAGNOSIS — Z20822 Contact with and (suspected) exposure to covid-19: Secondary | ICD-10-CM | POA: Insufficient documentation

## 2020-12-02 DIAGNOSIS — Z01812 Encounter for preprocedural laboratory examination: Secondary | ICD-10-CM | POA: Insufficient documentation

## 2020-12-02 NOTE — Progress Notes (Addendum)
Anesthesia Chart Review:  Pt is a same day work up   Case: [id number] Date/Time: 12/05/20 1019   Procedure: PLIF - L1-L2 - exploration fusion remove of hardware L2-L5 (N/A Back)   Anesthesia type: General   Pre-op diagnosis: Lumbago   [facility] ROOM [facility] / [facility]   Surgeons: Donalee Citrin, MD      DISCUSSION: Pt is 65 years old with hx stroke in 2016 (prior loop recorder was removed 10/16/19 by Lanice Schwab, MD- notes in care everywhere) , HTN, OSA  Pt with hx alcoholic fatty liver: prior daily alcohol use; pt reported at PCP visit 11/25/20 (care everywhere) that he has cut back alcohol use. AST/ALT high on recent labs; PCP placed orders to recheck hepatic function in 2 months  Pt stopped ASA months ago for previous surgery. Pt instructed by PCP to restart it after current planned surgery for stroke prevention.   Reviewed liver function with Dr. Bradley Ferris. I notified Erie Noe in Dr. Lonie Peak office of recent lab results.    PROVIDERS: - PCP is Andreas Blower., MD   LABS: Will be obtained day of surgery  - CMP 11/25/20 (care everywhere): AST 133/ALT 137. Otherwise normal.    IMAGES: US abdomen limited RUQ 08/25/20 (care everywhere):  - Cholelithiasis without evidence of acute cholecystitis.  - Hepatic steatosis  - Limited visualization of the portal vein due to technique    EKG 05/03/20 (PCP's office): Sinus tachycardia. First degree A-V block. Left axis deviation. Nonspecific intraventricular block     CV:  Echo 04/29/15:  - Left ventricle: The cavity size was normal. There was moderate concentric hypertrophy. Systolic function was normal. The estimated ejection fraction was in the range of 50% to 55%. Wall motion was normal; there were no regional wall motion abnormalities. There was an increased relative contribution of atrial contraction to ventricular filling. Doppler parameters are consistent with abnormal left ventricular relaxation (grade 1 diastolic dysfunction).  - Aortic  valve: There is a calcified rounded density on the ventricular side of the Aortic Valve that measures 1.04 x 1.32 cm and emanates into the LVOT of unclear etiology. It may represent focal calcification as part of calcific AS but cannot rule out vegetation. Consider TEE if clinically indicated to further delineate. Moderately calcified annulus. Trileaflet. Moderate diffuse thickening and calcification. There was mild stenosis. There was mild regurgitation. Valve area (VTI): 1.78 cm^2. Valve area (Vmax): 1.43 cm^2. Valve area (Vmean): 1.22 cm^2.  Aorta: Aortic root dimension: 48 mm (ED).  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  - Mitral valve: Calcified annulus.  - Tricuspid valve: There was trivial regurgitation.  - Pulmonic valve: There was trivial regurgitation   Carotid duplex 04/29/15:  - Technically difficult due to high bifurcation.  - Right - 1% to 39% ICA stenosis. Vertebral artery flow is antegrade.  - Left - No evidence of significant stenosis. Vertebral artery flow is antegrade.   Past Medical History:  Diagnosis Date  . Fatty liver due to alcoholism   . History of kidney stones    seen on xray- no problem  . Hypertension   . Sleep apnea    cpap  . Stroke [facility]) 2016   TIA- no residual     Past Surgical History:  Procedure Laterality Date  . BACK SURGERY     x2  . COLONOSCOPY    . EP IMPLANTABLE DEVICE N/A 05/31/2015   Procedure: Loop Recorder Insertion;  Surgeon: Hillis Range, MD;  Location: [facility]  INVASIVE CV LAB;  Service: Cardiovascular;  Laterality: N/A;  . HERNIA REPAIR     umbical  . JOINT REPLACEMENT     Hip   . lumbar L4/5 fusion in 2008 Bilateral 2008  . TEE WITHOUT CARDIOVERSION N/A 05/16/2015   Procedure: TRANSESOPHAGEAL ECHOCARDIOGRAM (TEE);  Surgeon: Vesta Mixer, MD;  Location: [facility] ENDOSCOPY;  Service: Cardiovascular;  Laterality: N/A;    MEDICATIONS: No current facility-administered medications for this encounter.   Marland Kitchen aspirin EC 81 MG  tablet  . atorvastatin (LIPITOR) 40 MG tablet  . pantoprazole (PROTONIX) 40 MG tablet  . valsartan-hydrochlorothiazide (DIOVAN-HCT) 320-12.5 MG per tablet  . amLODipine (NORVASC) 10 MG tablet    If labs acceptable day of surgery, I anticipate pt can proceed with surgery as scheduled.  Rica Mast, PhD, FNP-BC [facility] Short Stay Surgical Center/Anesthesiology Phone: [phone] 12/02/2020 1:03 PM

## 2020-12-02 NOTE — Anesthesia Preprocedure Evaluation (Addendum)
Anesthesia Evaluation  Patient identified by MRN, date of birth, ID band Patient awake    Reviewed: Allergy & Precautions, NPO status , Patient's Chart, lab work & pertinent test results  History of Anesthesia Complications Negative for: history of anesthetic complications  Airway Mallampati: II  TM Distance: >3 FB Neck ROM: Full    Dental  (+) Dental Advisory Given, Chipped   Pulmonary sleep apnea and Continuous Positive Airway Pressure Ventilation , former smoker,  12/02/2020 SARS coronavirus NEG   breath sounds clear to auscultation       Cardiovascular hypertension, Pt. on medications (-) angina Rhythm:Regular Rate:Normal     Neuro/Psych TIAnegative psych ROS   GI/Hepatic GERD  Medicated and Controlled,(+)     substance abuse (no longer uses ETOH)  alcohol use,   Endo/Other  Morbid obesity  Renal/GU negative Renal ROS     Musculoskeletal   Abdominal (+) + obese,   Peds  Hematology negative hematology ROS (+)   Anesthesia Other Findings   Reproductive/Obstetrics                           Anesthesia Physical Anesthesia Plan  ASA: III  Anesthesia Plan: General   Post-op Pain Management:    Induction: Intravenous  PONV Risk Score and Plan: 2 and Ondansetron and Dexamethasone  Airway Management Planned: Oral ETT  Additional Equipment: None  Intra-op Plan:   Post-operative Plan: Extubation in OR  Informed Consent: I have reviewed the patients History and Physical, chart, labs and discussed the procedure including the risks, benefits and alternatives for the proposed anesthesia with the patient or authorized representative who has indicated his/her understanding and acceptance.     Dental advisory given  Plan Discussed with: CRNA and Surgeon  Anesthesia Plan Comments: (See APP note by Joslyn Hy, FNP )      Anesthesia Quick Evaluation

## 2020-12-02 NOTE — Progress Notes (Addendum)
Mr. Christian Mcintyre denies chest pain or shortness of breath. Patient denies any s/s of Covid in his household and to his knowledge has not been in contact with anyone who does.  Mr. Christian Mcintyre will be tested for Coivd today and will quarantine with his family.  Mr. Christian Mcintyre stopped ASA 81 mg prior to hip surgery in January of this year, patient is aware that he should restart it again after surgery when Dr. Wynetta Emery says it is ok,.  Mr. Christian Mcintyre 's medication record has that Amlodipine is on hold.  Patient said that he though it was one of the medications that he was instructed to not take prior to surgery.  I instructed patient to start taking Amlodipine again.  I instructed Mr. Christian Mcintyre to bring the mask to CPAP  with him.

## 2020-12-03 LAB — SARS CORONAVIRUS 2 (TAT 6-24 HRS): SARS Coronavirus 2: NEGATIVE

## 2020-12-05 ENCOUNTER — Inpatient Hospital Stay (HOSPITAL_COMMUNITY): Payer: BC Managed Care – PPO

## 2020-12-05 ENCOUNTER — Inpatient Hospital Stay (HOSPITAL_COMMUNITY)
Admission: RE | Admit: 2020-12-05 | Discharge: 2020-12-06 | DRG: 455 | Disposition: A | Discharge status: Home / Self Care | Payer: BC Managed Care – PPO | Attending: Neurosurgery | Admitting: Neurosurgery

## 2020-12-05 ENCOUNTER — Encounter (HOSPITAL_COMMUNITY)
Admission: RE | Disposition: A | Discharge status: Home / Self Care | Payer: Self-pay | Source: Home / Self Care | Attending: Neurosurgery

## 2020-12-05 ENCOUNTER — Other Ambulatory Visit: Payer: Self-pay

## 2020-12-05 ENCOUNTER — Inpatient Hospital Stay (HOSPITAL_COMMUNITY): Payer: BC Managed Care – PPO | Admitting: Emergency Medicine

## 2020-12-05 DIAGNOSIS — Z87442 Personal history of urinary calculi: Secondary | ICD-10-CM

## 2020-12-05 DIAGNOSIS — Z419 Encounter for procedure for purposes other than remedying health state, unspecified: Secondary | ICD-10-CM

## 2020-12-05 DIAGNOSIS — Z8249 Family history of ischemic heart disease and other diseases of the circulatory system: Secondary | ICD-10-CM | POA: Diagnosis not present

## 2020-12-05 DIAGNOSIS — Z20822 Contact with and (suspected) exposure to covid-19: Secondary | ICD-10-CM | POA: Diagnosis present

## 2020-12-05 DIAGNOSIS — M48061 Spinal stenosis, lumbar region without neurogenic claudication: Principal | ICD-10-CM | POA: Diagnosis present

## 2020-12-05 DIAGNOSIS — K760 Fatty (change of) liver, not elsewhere classified: Secondary | ICD-10-CM | POA: Diagnosis present

## 2020-12-05 DIAGNOSIS — Z7982 Long term (current) use of aspirin: Secondary | ICD-10-CM

## 2020-12-05 DIAGNOSIS — Z79899 Other long term (current) drug therapy: Secondary | ICD-10-CM | POA: Diagnosis not present

## 2020-12-05 DIAGNOSIS — I10 Essential (primary) hypertension: Secondary | ICD-10-CM | POA: Diagnosis present

## 2020-12-05 DIAGNOSIS — Z981 Arthrodesis status: Secondary | ICD-10-CM | POA: Diagnosis not present

## 2020-12-05 DIAGNOSIS — M5126 Other intervertebral disc displacement, lumbar region: Secondary | ICD-10-CM | POA: Diagnosis present

## 2020-12-05 DIAGNOSIS — Z8673 Personal history of transient ischemic attack (TIA), and cerebral infarction without residual deficits: Secondary | ICD-10-CM | POA: Diagnosis not present

## 2020-12-05 DIAGNOSIS — G4733 Obstructive sleep apnea (adult) (pediatric): Secondary | ICD-10-CM | POA: Diagnosis present

## 2020-12-05 DIAGNOSIS — Z87891 Personal history of nicotine dependence: Secondary | ICD-10-CM

## 2020-12-05 DIAGNOSIS — K219 Gastro-esophageal reflux disease without esophagitis: Secondary | ICD-10-CM | POA: Diagnosis present

## 2020-12-05 DIAGNOSIS — M5136 Other intervertebral disc degeneration, lumbar region: Secondary | ICD-10-CM | POA: Diagnosis present

## 2020-12-05 DIAGNOSIS — F1020 Alcohol dependence, uncomplicated: Secondary | ICD-10-CM | POA: Diagnosis present

## 2020-12-05 HISTORY — DX: Alcoholic fatty liver: K70.0

## 2020-12-05 HISTORY — DX: Personal history of urinary calculi: Z87.442

## 2020-12-05 LAB — PROTIME-INR
INR: 1 (ref 0.8–1.2)
Prothrombin Time: 12.8 seconds (ref 11.4–15.2)

## 2020-12-05 LAB — BASIC METABOLIC PANEL
Anion gap: 13 (ref 5–15)
BUN: 8 mg/dL (ref 8–23)
CO2: 23 mmol/L (ref 22–32)
Calcium: 9.4 mg/dL (ref 8.9–10.3)
Chloride: 100 mmol/L (ref 98–111)
Creatinine, Ser: 0.77 mg/dL (ref 0.61–1.24)
GFR, Estimated: >60 mL/min (ref >60)
Glucose, Bld: 89 mg/dL (ref 70–99)
Potassium: 3.4 mmol/L — ABNORMAL LOW (ref 3.5–5.1)
Sodium: 136 mmol/L (ref 135–145)

## 2020-12-05 LAB — SURGICAL PCR SCREEN
MRSA, PCR: NEGATIVE
Staphylococcus aureus: NEGATIVE

## 2020-12-05 LAB — CBC
HCT: 42.5 % (ref 39.0–52.0)
Hemoglobin: 14.4 g/dL (ref 13.0–17.0)
MCH: 33.2 pg (ref 26.0–34.0)
MCHC: 33.9 g/dL (ref 30.0–36.0)
MCV: 97.9 fL (ref 80.0–100.0)
Platelets: 281 10*3/uL (ref 150–400)
RBC: 4.34 MIL/uL (ref 4.22–5.81)
RDW: 11.7 % (ref 11.5–15.5)
WBC: 4.1 10*3/uL (ref 4.0–10.5)
nRBC: 0 % (ref 0.0–0.2)

## 2020-12-05 LAB — TYPE AND SCREEN
ABO/RH(D): A POS
Antibody Screen: NEGATIVE

## 2020-12-05 SURGERY — POSTERIOR LUMBAR FUSION 1 WITH HARDWARE REMOVAL
Anesthesia: General | Site: Back

## 2020-12-05 MED ORDER — SUGAMMADEX SODIUM 200 MG/2ML IV SOLN
INTRAVENOUS | Status: DC | PRN
  Administered 2020-12-05: 400 mg via INTRAVENOUS

## 2020-12-05 MED ORDER — MIDAZOLAM HCL 2 MG/2ML IJ SOLN
INTRAMUSCULAR | Status: AC
  Filled 2020-12-05: qty 2

## 2020-12-05 MED ORDER — BUPIVACAINE LIPOSOME 1.3 % IJ SUSP
INTRAMUSCULAR | Status: AC
  Filled 2020-12-05: qty 20

## 2020-12-05 MED ORDER — ATORVASTATIN CALCIUM 40 MG PO TABS
40.0000 mg | ORAL_TABLET | Freq: Every day | ORAL | Status: DC
  Administered 2020-12-05: 40 mg via ORAL
  Filled 2020-12-05: qty 1

## 2020-12-05 MED ORDER — ALUM & MAG HYDROXIDE-SIMETH 200-200-20 MG/5ML PO SUSP: 30.0000 mL | Freq: Four times a day (QID) | ORAL | Status: DC | PRN

## 2020-12-05 MED ORDER — ONDANSETRON HCL 4 MG PO TABS: 4.0000 mg | ORAL_TABLET | Freq: Four times a day (QID) | ORAL | Status: DC | PRN

## 2020-12-05 MED ORDER — FENTANYL CITRATE (PF) 250 MCG/5ML IJ SOLN
INTRAMUSCULAR | Status: AC
  Filled 2020-12-05: qty 5

## 2020-12-05 MED ORDER — THROMBIN 20000 UNITS EX SOLR
CUTANEOUS | Status: DC | PRN
  Administered 2020-12-05: 20 mL via TOPICAL

## 2020-12-05 MED ORDER — PHENYLEPHRINE HCL-NACL 10-0.9 MG/250ML-% IV SOLN
INTRAVENOUS | Status: DC | PRN
  Administered 2020-12-05: 25 ug/min via INTRAVENOUS

## 2020-12-05 MED ORDER — LACTATED RINGERS IV SOLN: INTRAVENOUS | Status: DC

## 2020-12-05 MED ORDER — ORAL CARE MOUTH RINSE: 15.0000 mL | Freq: Once | OROMUCOSAL | Status: AC

## 2020-12-05 MED ORDER — 0.9 % SODIUM CHLORIDE (POUR BTL) OPTIME
TOPICAL | Status: DC | PRN
  Administered 2020-12-05: 1000 mL

## 2020-12-05 MED ORDER — OXYCODONE HCL 5 MG PO TABS
5.0000 mg | ORAL_TABLET | ORAL | Status: DC | PRN
Start: 2020-12-05 — End: 2020-12-05

## 2020-12-05 MED ORDER — THROMBIN 5000 UNITS EX SOLR
CUTANEOUS | Status: AC
  Filled 2020-12-05: qty 5000

## 2020-12-05 MED ORDER — MENTHOL 3 MG MT LOZG: 1.0000 | LOZENGE | OROMUCOSAL | Status: DC | PRN

## 2020-12-05 MED ORDER — PANTOPRAZOLE SODIUM 40 MG IV SOLR: 40.0000 mg | Freq: Every day | INTRAVENOUS | Status: DC

## 2020-12-05 MED ORDER — CHLORHEXIDINE GLUCONATE CLOTH 2 % EX PADS: 6.0000 | MEDICATED_PAD | Freq: Once | CUTANEOUS | Status: DC

## 2020-12-05 MED ORDER — HYDROMORPHONE HCL 1 MG/ML IJ SOLN: 0.2500 mg | INTRAMUSCULAR | Status: DC | PRN

## 2020-12-05 MED ORDER — OXYCODONE HCL 5 MG PO TABS
5.0000 mg | ORAL_TABLET | ORAL | Status: DC | PRN
  Administered 2020-12-05 – 2020-12-06 (×5): 5 mg via ORAL
  Filled 2020-12-05 (×5): qty 1

## 2020-12-05 MED ORDER — ROCURONIUM BROMIDE 10 MG/ML (PF) SYRINGE
PREFILLED_SYRINGE | INTRAVENOUS | Status: DC | PRN
  Administered 2020-12-05 (×2): 20 mg via INTRAVENOUS
  Administered 2020-12-05: 40 mg via INTRAVENOUS
  Administered 2020-12-05: 20 mg via INTRAVENOUS
  Administered 2020-12-05: 100 mg via INTRAVENOUS

## 2020-12-05 MED ORDER — PHENYLEPHRINE 40 MCG/ML (10ML) SYRINGE FOR IV PUSH (FOR BLOOD PRESSURE SUPPORT)
PREFILLED_SYRINGE | INTRAVENOUS | Status: AC
  Filled 2020-12-05: qty 10

## 2020-12-05 MED ORDER — ROCURONIUM BROMIDE 10 MG/ML (PF) SYRINGE
PREFILLED_SYRINGE | INTRAVENOUS | Status: AC
  Filled 2020-12-05: qty 10

## 2020-12-05 MED ORDER — SODIUM CHLORIDE 0.9% FLUSH: 3.0000 mL | INTRAVENOUS | Status: DC | PRN

## 2020-12-05 MED ORDER — BUPIVACAINE HCL (PF) 0.25 % IJ SOLN
INTRAMUSCULAR | Status: AC
  Filled 2020-12-05: qty 30

## 2020-12-05 MED ORDER — ASPIRIN EC 81 MG PO TBEC: 81.0000 mg | DELAYED_RELEASE_TABLET | Freq: Every day | ORAL | Status: DC

## 2020-12-05 MED ORDER — CYCLOBENZAPRINE HCL 10 MG PO TABS: 10.0000 mg | ORAL_TABLET | Freq: Three times a day (TID) | ORAL | Status: DC | PRN

## 2020-12-05 MED ORDER — LIDOCAINE 2% (20 MG/ML) 5 ML SYRINGE
INTRAMUSCULAR | Status: DC | PRN
  Administered 2020-12-05: 40 mg via INTRAVENOUS

## 2020-12-05 MED ORDER — CEFAZOLIN SODIUM-DEXTROSE 2-4 GM/100ML-% IV SOLN
2.0000 g | Freq: Three times a day (TID) | INTRAVENOUS | Status: AC
  Administered 2020-12-05 – 2020-12-06 (×2): 2 g via INTRAVENOUS
  Filled 2020-12-05 (×2): qty 100

## 2020-12-05 MED ORDER — BUPIVACAINE LIPOSOME 1.3 % IJ SUSP
INTRAMUSCULAR | Status: DC | PRN
  Administered 2020-12-05: 20 mL

## 2020-12-05 MED ORDER — MIDAZOLAM HCL 2 MG/2ML IJ SOLN: 0.5000 mg | Freq: Once | INTRAMUSCULAR | Status: DC | PRN

## 2020-12-05 MED ORDER — ONDANSETRON HCL 4 MG/2ML IJ SOLN: 4.0000 mg | Freq: Four times a day (QID) | INTRAMUSCULAR | Status: DC | PRN

## 2020-12-05 MED ORDER — ACETAMINOPHEN 500 MG PO TABS
1000.0000 mg | ORAL_TABLET | Freq: Once | ORAL | Status: DC
  Filled 2020-12-05: qty 2

## 2020-12-05 MED ORDER — LIDOCAINE-EPINEPHRINE 1 %-1:100000 IJ SOLN
INTRAMUSCULAR | Status: DC | PRN
  Administered 2020-12-05: 10 mL

## 2020-12-05 MED ORDER — ALBUMIN HUMAN 5 % IV SOLN: INTRAVENOUS | Status: DC | PRN

## 2020-12-05 MED ORDER — MIDAZOLAM HCL 5 MG/5ML IJ SOLN
INTRAMUSCULAR | Status: DC | PRN
  Administered 2020-12-05 (×2): 1 mg via INTRAVENOUS
  Administered 2020-12-05: 2 mg via INTRAVENOUS

## 2020-12-05 MED ORDER — PROPOFOL 10 MG/ML IV BOLUS
INTRAVENOUS | Status: AC
  Filled 2020-12-05: qty 20

## 2020-12-05 MED ORDER — CEFAZOLIN SODIUM-DEXTROSE 2-4 GM/100ML-% IV SOLN
2.0000 g | INTRAVENOUS | Status: AC
  Administered 2020-12-05: 2 g via INTRAVENOUS
  Filled 2020-12-05: qty 100

## 2020-12-05 MED ORDER — AMLODIPINE BESYLATE 5 MG PO TABS
10.0000 mg | ORAL_TABLET | Freq: Every day | ORAL | Status: DC
  Administered 2020-12-06: 10 mg via ORAL
  Filled 2020-12-05: qty 2

## 2020-12-05 MED ORDER — SODIUM CHLORIDE 0.9 % IV SOLN
250.0000 mL | INTRAVENOUS | Status: DC
  Administered 2020-12-05: 250 mL via INTRAVENOUS

## 2020-12-05 MED ORDER — HYDROMORPHONE HCL 1 MG/ML IJ SOLN
0.5000 mg | INTRAMUSCULAR | Status: DC | PRN
Start: 2020-12-05 — End: 2020-12-06

## 2020-12-05 MED ORDER — DEXAMETHASONE SODIUM PHOSPHATE 10 MG/ML IJ SOLN
INTRAMUSCULAR | Status: AC
  Filled 2020-12-05: qty 1

## 2020-12-05 MED ORDER — MEPERIDINE HCL 25 MG/ML IJ SOLN
6.2500 mg | INTRAMUSCULAR | Status: DC | PRN
Start: 2020-12-05 — End: 2020-12-05

## 2020-12-05 MED ORDER — ONDANSETRON HCL 4 MG/2ML IJ SOLN
INTRAMUSCULAR | Status: AC
  Filled 2020-12-05: qty 2

## 2020-12-05 MED ORDER — FENTANYL CITRATE (PF) 100 MCG/2ML IJ SOLN
INTRAMUSCULAR | Status: DC | PRN
  Administered 2020-12-05: 50 ug via INTRAVENOUS
  Administered 2020-12-05: 150 ug via INTRAVENOUS
  Administered 2020-12-05 (×3): 50 ug via INTRAVENOUS

## 2020-12-05 MED ORDER — HYDROCHLOROTHIAZIDE 12.5 MG PO CAPS
12.5000 mg | ORAL_CAPSULE | Freq: Every day | ORAL | Status: DC
  Administered 2020-12-06: 12.5 mg via ORAL
  Filled 2020-12-05: qty 1

## 2020-12-05 MED ORDER — CHLORHEXIDINE GLUCONATE 0.12 % MT SOLN
15.0000 mL | Freq: Once | OROMUCOSAL | Status: AC
  Administered 2020-12-05: 15 mL via OROMUCOSAL
  Filled 2020-12-05: qty 15

## 2020-12-05 MED ORDER — VALSARTAN-HYDROCHLOROTHIAZIDE 320-12.5 MG PO TABS: 1.0000 | ORAL_TABLET | Freq: Every day | ORAL | Status: DC

## 2020-12-05 MED ORDER — THROMBIN 20000 UNITS EX SOLR
CUTANEOUS | Status: AC
  Filled 2020-12-05: qty 20000

## 2020-12-05 MED ORDER — PROPOFOL 10 MG/ML IV BOLUS
INTRAVENOUS | Status: DC | PRN
  Administered 2020-12-05: 200 mg via INTRAVENOUS

## 2020-12-05 MED ORDER — OXYCODONE HCL 5 MG/5ML PO SOLN: 5.0000 mg | Freq: Once | ORAL | Status: DC | PRN

## 2020-12-05 MED ORDER — ACETAMINOPHEN 325 MG PO TABS
650.0000 mg | ORAL_TABLET | ORAL | Status: DC | PRN
  Administered 2020-12-05 – 2020-12-06 (×3): 650 mg via ORAL
  Filled 2020-12-05 (×3): qty 2

## 2020-12-05 MED ORDER — ACETAMINOPHEN 650 MG RE SUPP: 650.0000 mg | RECTAL | Status: DC | PRN

## 2020-12-05 MED ORDER — SODIUM CHLORIDE 0.9% FLUSH: 3.0000 mL | Freq: Two times a day (BID) | INTRAVENOUS | Status: DC

## 2020-12-05 MED ORDER — CEFAZOLIN SODIUM-DEXTROSE 2-4 GM/100ML-% IV SOLN: 2.0000 g | INTRAVENOUS | Status: DC

## 2020-12-05 MED ORDER — DEXAMETHASONE SODIUM PHOSPHATE 10 MG/ML IJ SOLN: 10.0000 mg | Freq: Once | INTRAMUSCULAR | Status: DC

## 2020-12-05 MED ORDER — DEXAMETHASONE SODIUM PHOSPHATE 10 MG/ML IJ SOLN
10.0000 mg | Freq: Once | INTRAMUSCULAR | Status: AC
  Administered 2020-12-05: 10 mg via INTRAVENOUS
  Filled 2020-12-05: qty 1

## 2020-12-05 MED ORDER — PROMETHAZINE HCL 25 MG/ML IJ SOLN: 6.2500 mg | INTRAMUSCULAR | Status: DC | PRN

## 2020-12-05 MED ORDER — LIDOCAINE-EPINEPHRINE 1 %-1:100000 IJ SOLN
INTRAMUSCULAR | Status: AC
  Filled 2020-12-05: qty 1

## 2020-12-05 MED ORDER — IRBESARTAN 150 MG PO TABS
300.0000 mg | ORAL_TABLET | Freq: Every day | ORAL | Status: DC
  Administered 2020-12-06: 300 mg via ORAL
  Filled 2020-12-05: qty 2

## 2020-12-05 MED ORDER — PANTOPRAZOLE SODIUM 40 MG PO TBEC
40.0000 mg | DELAYED_RELEASE_TABLET | Freq: Every day | ORAL | Status: DC
  Administered 2020-12-06: 40 mg via ORAL
  Filled 2020-12-05: qty 1

## 2020-12-05 MED ORDER — PHENOL 1.4 % MT LIQD: 1.0000 | OROMUCOSAL | Status: DC | PRN

## 2020-12-05 MED ORDER — OXYCODONE HCL 5 MG PO TABS: 5.0000 mg | ORAL_TABLET | Freq: Once | ORAL | Status: DC | PRN

## 2020-12-05 SURGICAL SUPPLY — 75 items
BASKET BONE COLLECTION (BASKET) ×2 IMPLANT
BENZOIN TINCTURE PRP APPL 2/3 (GAUZE/BANDAGES/DRESSINGS) ×2 IMPLANT
BLADE CLIPPER SURG (BLADE) IMPLANT
BLADE SURG 11 STRL SS (BLADE) ×2 IMPLANT
BONE VIVIGEN FORMABLE 5.4CC (Bone Implant) ×2 IMPLANT
BUR CUTTER 7.0 ROUND (BURR) ×2 IMPLANT
BUR MATCHSTICK NEURO 3.0 LAGG (BURR) ×2 IMPLANT
CANISTER SUCT 3000ML PPV (MISCELLANEOUS) ×2 IMPLANT
CAP LOCKING THREADED (Cap) ×8 IMPLANT
CARTRIDGE OIL MAESTRO DRILL (MISCELLANEOUS) ×1 IMPLANT
CATH COUDE FOLEY 2W 5CC 18FR (CATHETERS) ×2 IMPLANT
CATH COUDE FOLEY 5CC 14FR (CATHETERS) IMPLANT
CNTNR URN SCR LID CUP LEK RST (MISCELLANEOUS) ×1 IMPLANT
CONT SPEC 4OZ STRL OR WHT (MISCELLANEOUS) ×1
COVER BACK TABLE 60X90IN (DRAPES) ×2 IMPLANT
COVER WAND RF STERILE (DRAPES) ×2 IMPLANT
DECANTER SPIKE VIAL GLASS SM (MISCELLANEOUS) ×2 IMPLANT
DERMABOND ADVANCED (GAUZE/BANDAGES/DRESSINGS) ×1
DERMABOND ADVANCED .7 DNX12 (GAUZE/BANDAGES/DRESSINGS) ×1 IMPLANT
DIFFUSER DRILL AIR PNEUMATIC (MISCELLANEOUS) ×2 IMPLANT
DRAPE C-ARM 42X72 X-RAY (DRAPES) ×4 IMPLANT
DRAPE C-ARMOR (DRAPES) IMPLANT
DRAPE HALF SHEET 40X57 (DRAPES) IMPLANT
DRAPE LAPAROTOMY 100X72X124 (DRAPES) ×2 IMPLANT
DRAPE SURG 17X23 STRL (DRAPES) ×2 IMPLANT
DRSG OPSITE 4X5.5 SM (GAUZE/BANDAGES/DRESSINGS) ×2 IMPLANT
DRSG OPSITE POSTOP 4X6 (GAUZE/BANDAGES/DRESSINGS) ×2 IMPLANT
DRSG OPSITE POSTOP 4X8 (GAUZE/BANDAGES/DRESSINGS) ×2 IMPLANT
DURAPREP 26ML APPLICATOR (WOUND CARE) ×2 IMPLANT
ELECT REM PT RETURN 9FT ADLT (ELECTROSURGICAL) ×2
ELECTRODE REM PT RTRN 9FT ADLT (ELECTROSURGICAL) ×1 IMPLANT
EVACUATOR 1/8 PVC DRAIN (DRAIN) ×2 IMPLANT
EVACUATOR 3/16  PVC DRAIN (DRAIN) ×1
EVACUATOR 3/16 PVC DRAIN (DRAIN) ×1 IMPLANT
GAUZE 4X4 16PLY RFD (DISPOSABLE) IMPLANT
GAUZE SPONGE 4X4 12PLY STRL (GAUZE/BANDAGES/DRESSINGS) ×2 IMPLANT
GLOVE BIO SURGEON STRL SZ7 (GLOVE) IMPLANT
GLOVE BIO SURGEON STRL SZ8 (GLOVE) ×4 IMPLANT
GLOVE EXAM NITRILE XL STR (GLOVE) IMPLANT
GLOVE INDICATOR 8.5 STRL (GLOVE) ×4 IMPLANT
GLOVE SURG UNDER POLY LF SZ7 (GLOVE) IMPLANT
GOWN STRL REUS W/ TWL LRG LVL3 (GOWN DISPOSABLE) ×2 IMPLANT
GOWN STRL REUS W/ TWL XL LVL3 (GOWN DISPOSABLE) ×2 IMPLANT
GOWN STRL REUS W/TWL 2XL LVL3 (GOWN DISPOSABLE) IMPLANT
GOWN STRL REUS W/TWL LRG LVL3 (GOWN DISPOSABLE) ×2
GOWN STRL REUS W/TWL XL LVL3 (GOWN DISPOSABLE) ×2
GRAFT BONE PROTEIOS LRG 5CC (Orthopedic Implant) ×2 IMPLANT
HEMOSTAT POWDER KIT SURGIFOAM (HEMOSTASIS) ×2 IMPLANT
KIT BASIN OR (CUSTOM PROCEDURE TRAY) ×2 IMPLANT
KIT TURNOVER KIT B (KITS) ×2 IMPLANT
MILL MEDIUM DISP (BLADE) ×2 IMPLANT
NEEDLE HYPO 21X1.5 SAFETY (NEEDLE) ×2 IMPLANT
NEEDLE HYPO 25X1 1.5 SAFETY (NEEDLE) ×2 IMPLANT
NS IRRIG 1000ML POUR BTL (IV SOLUTION) ×2 IMPLANT
OIL CARTRIDGE MAESTRO DRILL (MISCELLANEOUS) ×2
PACK LAMINECTOMY NEURO (CUSTOM PROCEDURE TRAY) ×2 IMPLANT
PAD ARMBOARD 7.5X6 YLW CONV (MISCELLANEOUS) ×6 IMPLANT
ROD CURVED 5.5X110 (Rod) ×4 IMPLANT
SCREW AMP MODULAR CREO 6.5X45 (Screw) ×4 IMPLANT
SCREW CANN 6.5X16MM (Screw) ×8 IMPLANT
SCREW PA THRD CREO TULIP 5.5X4 (Head) ×4 IMPLANT
SPACER SUSTAIN RT 12 8D 9X26 (Spacer) ×4 IMPLANT
SPONGE LAP 4X18 RFD (DISPOSABLE) IMPLANT
SPONGE SURGIFOAM ABS GEL 100 (HEMOSTASIS) ×2 IMPLANT
STRIP CLOSURE SKIN 1/2X4 (GAUZE/BANDAGES/DRESSINGS) ×2 IMPLANT
SUT VIC AB 0 CT1 18XCR BRD8 (SUTURE) ×2 IMPLANT
SUT VIC AB 0 CT1 8-18 (SUTURE) ×2
SUT VIC AB 2-0 CT1 18 (SUTURE) ×4 IMPLANT
SUT VIC AB 4-0 PS2 27 (SUTURE) ×2 IMPLANT
SYR 20ML LL LF (SYRINGE) ×2 IMPLANT
TOWEL GREEN STERILE (TOWEL DISPOSABLE) ×2 IMPLANT
TOWEL GREEN STERILE FF (TOWEL DISPOSABLE) ×2 IMPLANT
TRAY FOLEY MTR SLVR 14FR STAT (SET/KITS/TRAYS/PACK) IMPLANT
TRAY FOLEY MTR SLVR 16FR STAT (SET/KITS/TRAYS/PACK) IMPLANT
WATER STERILE IRR 1000ML POUR (IV SOLUTION) ×2 IMPLANT

## 2020-12-05 NOTE — Evaluation (Signed)
Physical Therapy Evaluation Patient Details Name: Christian Mcintyre MRN: [id number] DOB: [birth year] Today's Date: 12/05/2020   History of Present Illness  65 yo male s/p L2-S1 fusion. 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  Clinical Impression  Pt is close to baseline functioning and should be safe at home safely with available PRN from his wife.  All education completed, no further questions. There are no further acute PT needs.  Will sign off at this time.     Follow Up Recommendations No PT follow up    Equipment Recommendations  None recommended by PT    Recommendations for Other Services       Precautions / Restrictions Precautions Precautions: Back Precaution Booklet Issued: Yes (comment) Precaution Comments: Reviewed and pt recalling all precautions Required Braces or Orthoses: Spinal Brace Spinal Brace: Lumbar corset;Applied in sitting position Restrictions Weight Bearing Restrictions: No      Mobility  Bed Mobility Overal bed mobility: Modified Independent             General bed mobility comments: use of log roll tehcnique    Transfers Overall transfer level: Modified independent                  Ambulation/Gait Ambulation/Gait assistance: Independent Gait Distance (Feet): 300 Feet Assistive device: None Gait Pattern/deviations: Step-through pattern   Gait velocity interpretation: 1.31 - 2.62 ft/sec, indicative of limited community ambulator General Gait Details: steady with age appropriate gait speeds  Stairs Stairs: Yes Stairs assistance: Supervision Stair Management: One rail Right;Alternating pattern;Step to pattern;Forwards Number of Stairs: 12 General stair comments: safe with the rail  Wheelchair Mobility    Modified Rankin (Stroke Patients Only)       Balance Overall balance assessment: No apparent balance deficits (not formally assessed)                                            Pertinent Vitals/Pain Pain Assessment: Faces Faces Pain Scale: No hurt Pain Intervention(s): Monitored during session    Home Living Family/patient expects to be discharged to:: Private residence Living Arrangements: Spouse/significant other Available Help at Discharge: Family;Available 24 hours/day Type of Home: House Home Access: Stairs to enter Entrance Stairs-Rails: None Entrance Stairs-Number of Steps: 1 Home Layout: Two level Home Equipment: Cane - single point;Shower seat;Grab bars - tub/shower;Hand held shower head;Adaptive equipment      Prior Function Level of Independence: Independent               Hand Dominance   Dominant Hand: Right    Extremity/Trunk Assessment   Upper Extremity Assessment Upper Extremity Assessment: Overall WFL for tasks assessed    Lower Extremity Assessment Lower Extremity Assessment: Overall WFL for tasks assessed (mild proximal weakness, but functional)    Cervical / Trunk Assessment Cervical / Trunk Assessment: Other exceptions Cervical / Trunk Exceptions: s/p back sx  Communication   Communication: No difficulties  Cognition Arousal/Alertness: Awake/alert Behavior During Therapy: WFL for tasks assessed/performed Overall Cognitive Status: Within Functional Limits for tasks assessed                                        General Comments General comments (skin integrity, edema, etc.): pt reinforced  in back care/prec, log roll, bracing issues, lifting restrictions, progression of activity.    Exercises     Assessment/Plan    PT Assessment Patent does not need any further PT services  PT Problem List         PT Treatment Interventions      PT Goals (Current goals can be found in the Care Plan section)  Acute Rehab PT Goals Patient Stated Goal: Go home PT Goal Formulation: All assessment and education complete, DC therapy    Frequency      Barriers to discharge        Co-evaluation               AM-PAC PT "6 Clicks" Mobility  Outcome Measure Help needed turning from your back to your side while in a flat bed without using bedrails?: None Help needed moving from lying on your back to sitting on the side of a flat bed without using bedrails?: None Help needed moving to and from a bed to a chair (including a wheelchair)?: None Help needed standing up from a chair using your arms (e.g., wheelchair or bedside chair)?: None Help needed to walk in hospital room?: None Help needed climbing 3-5 steps with a railing? : A Little 6 Click Score: 23    End of Session Equipment Utilized During Treatment: Back brace Activity Tolerance: Patient tolerated treatment well Patient left: Other (comment);with call bell/phone within reach;with family/visitor present (up in the room) Nurse Communication: Mobility status PT Visit Diagnosis: Other abnormalities of gait and mobility (R26.89);Pain Pain - part of body:  (back)    Time: 1713-1730 PT Time Calculation (min) (ACUTE ONLY): 17 min   Charges:   PT Evaluation $PT Eval Low Complexity: 1 Low          12/05/2020  Jacinto Halim., PT Acute Rehabilitation Services [phone]  (pager) [phone]  (office)  Eliseo Gum Jamayia Croker 12/05/2020, 5:32 PM

## 2020-12-05 NOTE — Evaluation (Signed)
Occupational Therapy Evaluation Patient Details Name: Christian Mcintyre MRN: [id number] DOB: [birth year] Today's Date: 12/05/2020    History of Present Illness 65 yo male s/p L2-S1 fusion. 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   Clinical Impression   PTA, pt was living with his wife and was independent. Currently, pt performing ADLs and functional mobility at Mod I level with increased time as needed. Provided education and handout on back precautions, brace management, LB ADLs, toileting, and shower transfer; pt demonstrated understanding. Answered all pt questions. Recommend dc home once medically stable per physician. All acute OT needs met and will sign off. Thank you.    Follow Up Recommendations  No OT follow up    Equipment Recommendations  None recommended by OT    Recommendations for Other Services PT consult     Precautions / Restrictions Precautions Precautions: Back Precaution Booklet Issued: Yes (comment) Precaution Comments: Reviewed and pt recalling all precautions Required Braces or Orthoses: Spinal Brace Spinal Brace: Lumbar corset;Applied in sitting position Restrictions Weight Bearing Restrictions: No      Mobility Bed Mobility Overal bed mobility: Modified Independent             General bed mobility comments: use of log roll tehcnique    Transfers Overall transfer level: Modified independent                    Balance Overall balance assessment: No apparent balance deficits (not formally assessed)                                         ADL either performed or assessed with clinical judgement   ADL Overall ADL's : Modified independent                                       General ADL Comments: Pt performing at Mod I level. Demonstrating understanding of compensatory techniques     Vision Baseline Vision/History: Wears  glasses Wears Glasses: At all times Patient Visual Report: No change from baseline       Perception     Praxis      Pertinent Vitals/Pain Pain Assessment: Faces Faces Pain Scale: No hurt Pain Intervention(s): Monitored during session     Hand Dominance Right   Extremity/Trunk Assessment Upper Extremity Assessment Upper Extremity Assessment: Overall WFL for tasks assessed   Lower Extremity Assessment Lower Extremity Assessment: Defer to PT evaluation   Cervical / Trunk Assessment Cervical / Trunk Assessment: Other exceptions Cervical / Trunk Exceptions: s/p back sx   Communication Communication Communication: No difficulties   Cognition Arousal/Alertness: Awake/alert Behavior During Therapy: WFL for tasks assessed/performed Overall Cognitive Status: Within Functional Limits for tasks assessed                                     General Comments  wife present. blood from penis after cath pulled. notified RN    Exercises     Shoulder Instructions      Home Living Family/patient expects to be discharged to:: Private residence Living Arrangements: Spouse/significant other Available Help at Discharge: Family;Available 24 hours/day Type of Home: House Home Access:  Stairs to enter [employer] of Steps: 1 Entrance Stairs-Rails: None Home Layout: Two level Alternate Level Stairs-Number of Steps: Flight - 13 Alternate Level Stairs-Rails: Right;Left Bathroom Shower/Tub: Occupational psychologist: Handicapped height     Home Equipment: Cane - single point;Shower seat;Grab bars - tub/shower;Hand held shower head;Adaptive equipment Adaptive Equipment: Reacher        Prior Functioning/Environment Level of Independence: Independent                 OT Problem List: Decreased activity tolerance;Decreased range of motion;Pain      OT Treatment/Interventions:      OT Goals(Current goals can be found in the care plan section)  Acute Rehab OT Goals Patient Stated Goal: Go home OT Goal Formulation: All assessment and education complete, DC therapy  OT Frequency:     Barriers to D/C:            Co-evaluation              AM-PAC OT "6 Clicks" Daily Activity     Outcome Measure Help from another person eating meals?: None Help from another person taking care of personal grooming?: None Help from another person toileting, which includes using toliet, bedpan, or urinal?: None Help from another person bathing (including washing, rinsing, drying)?: None Help from another person to put on and taking off regular upper body clothing?: None Help from another person to put on and taking off regular lower body clothing?: None 6 Click Score: 24   End of Session Equipment Utilized During Treatment: Back brace Nurse Communication: Mobility status  Activity Tolerance: Patient tolerated treatment well Patient left:  (in hallway with PT)  OT Visit Diagnosis: Other abnormalities of gait and mobility (R26.89);Pain Pain - part of body:  (Back)                Time: 1700-1713 OT Time Calculation (min): 13 min Charges:  OT General Charges $OT Visit: 1 Visit OT Evaluation $OT Eval Low Complexity: [place], OTR/L Acute Rehab Pager: [phone] Office: [place] 12/05/2020, 5:24 PM

## 2020-12-05 NOTE — Progress Notes (Signed)
Orthopedic Tech Progress Note Patient Details:  Christian Mcintyre [birth year] [id number] RN stated "patient has brace' Patient ID: Christian Mcintyre, male   DOB: Feb 02, 1956, 65 y.o.   MRN: [id number]   Christian Mcintyre 12/05/2020, 3:57 PM

## 2020-12-05 NOTE — Op Note (Signed)
Preoperative diagnosis: Lumbar spinal stenosis herniated nucleus pulposus and instability L1-L2  Postoperative diagnosis: Same  Procedure: #1 decompressive lumbar laminectomy L1-L2 with complete medial facetectomies radical foraminotomies of the L1 and L2 nerve roots in excess and requiring more work than would be needed with a standard interbody fusion  2.  Posterior lumbar interbody fusion L1-L2 utilizing the globus insert and rotate titanium cages packed with locally harvested autograft mixed with vivigen and protios  3.  Pedicle screw fixation L1-L2 utilizing the globus Creo amp modular 5.5 set marrying up utilizing globus addition to below the L3 screw  4.  Posterior lateral arthrodesis L1-L2 utilizing the locally harvested autograft mixed  Surgeon: Jillyn Hidden Seanne Chirico  Assistant: Julien Girt  Anesthesia: General  EBL: Minimal  HPI: 65 year old gentleman longstanding back pain previous L2-S1 fusion developed aggressive worsening back pain bilateral hip and leg pain and imaging revealed instability herniated disc at L1-L2.  Due to patient progression of clinical syndrome imaging findings and failed conservative treatment I recommended reexploration fusion removal of hardware and posterior lumbar interbody fusion at L1-L2 I extensively reviewed the risks and benefits of the operation with him as well as perioperative course expectations of outcome and alternatives of surgery and he understood and agreed to proceed forward.  Operative procedure patient brought into the OR was due to general anesthesia positioned prone Wilson frame his back was prepped and draped in routine sterile fashion superior aspect of his incision was opened up and identified the hardware immediately and exposed the previous L2-L3 and globus addition connector.  I then exposed the pedicle screw entry point for L1.  I then remove the spinous process at L1 performed central decompression with complete medial facetectomies  radical retinotomies of the L1 and L2 nerve roots.  Marked stenosis caused by hourglass compression of thecal sac was going on at L1-L2 a fair amount of epidural fibrosis from the previous surgery on the inferior aspect to frame this was all freed up aggressive under biting the superior tickling facet gained access to the lateral margin disc base and complete obliteration of the pars was performed to correct the deformity.  At this point disc base was cleaned out bilaterally extensively endplates prepared I inserted 2 cages with an extensive mount of autograft centrally as well as within the cages and cells.  This significantly opened up the disc base help reduce some of the deformity.  I then placed 2 pedicle screws at L1 they both had excellent purchase.  I then disconnected the previous addition set by removing the knots at L2 removing the rod after loosening the side to side connector I think fashion to rods and connected through the side to side to connect to the L1-L2 screws.  I then extensively decorticated the TPs at L1-L2 and the lateral facet joints and packed an extensive mount of autograft mixed posterior laterally.  I compressed L1 against L2 anchored everything in place and reinspected the foramina to confirm patency and no migration of graft material then lay Gelfoam top the dura medium Hemovac drain was placed and the wound was closed in layers with interrupted Vicryl in a running 4 subcuticular at the end the case all needle counts and sponge counts were correct.

## 2020-12-05 NOTE — Anesthesia Postprocedure Evaluation (Signed)
Anesthesia Post Note  Patient: Christian Mcintyre  Procedure(s) Performed: Posterior Lumbar Interbody Fusion- Lumbar one-Lumbar two - exploration fusion remove of hardware Lumbar two-Lumbar five (N/A Back)     Patient location during evaluation: PACU Anesthesia Type: General Level of consciousness: awake and alert, patient cooperative and oriented Pain management: pain level controlled Vital Signs Assessment: post-procedure vital signs reviewed and stable Respiratory status: spontaneous breathing, nonlabored ventilation and respiratory function stable Cardiovascular status: blood pressure returned to baseline and stable Postop Assessment: no apparent nausea or vomiting Anesthetic complications: no   No complications documented.  Last Vitals:  Vitals:   12/05/20 1430 12/05/20 1445  BP: 124/73 118/72  Pulse: 91 88  Resp: 14 17  Temp:  36.8 C  SpO2: 90% 93%    Last Pain:  Vitals:   12/05/20 1445  TempSrc:   PainSc: 0-No pain                 Christen Bedoya,E. Laelyn Blumenthal

## 2020-12-05 NOTE — Transfer of Care (Signed)
Immediate Anesthesia Transfer of Care Note  Patient: Christian Mcintyre  Procedure(s) Performed: Posterior Lumbar Interbody Fusion- Lumbar one-Lumbar two - exploration fusion remove of hardware Lumbar two-Lumbar five (N/A Back)  Patient Location: PACU  Anesthesia Type:General  Level of Consciousness: awake, alert  and oriented  Airway & Oxygen Therapy: Patient connected to face mask oxygen  Post-op Assessment: Post -op Vital signs reviewed and stable  Post vital signs: stable  Last Vitals:  Vitals Value Taken Time  BP 118/83 12/05/20 1413  Temp    Pulse 94 12/05/20 1414  Resp 13 12/05/20 1414  SpO2 96 % 12/05/20 1414  Vitals shown include unvalidated device data.  Last Pain:  Vitals:   12/05/20 0938  TempSrc:   PainSc: 4       Patients Stated Pain Goal: 3 (12/05/20 8469)  Complications: No complications documented.

## 2020-12-05 NOTE — Anesthesia Procedure Notes (Signed)
Procedure Name: Intubation Date/Time: 12/05/2020 10:50 AM Performed by: Lavell Luster, CRNA Pre-anesthesia Checklist: Patient identified, Emergency Drugs available, Suction available, Patient being monitored and Timeout performed Patient Re-evaluated:Patient Re-evaluated prior to induction Oxygen Delivery Method: Circle system utilized Preoxygenation: Pre-oxygenation with 100% oxygen Induction Type: IV induction Ventilation: Oral airway inserted - appropriate to patient size and Mask ventilation without difficulty Laryngoscope Size: Mac and 4 Tube size: 7.5 mm Number of attempts: 1 Airway Equipment and Method: Stylet Placement Confirmation: ETT inserted through vocal cords under direct vision,  positive ETCO2 and breath sounds checked- equal and bilateral Secured at: 22 cm Tube secured with: Tape Dental Injury: Teeth and Oropharynx as per pre-operative assessment

## 2020-12-05 NOTE — Progress Notes (Signed)
Per Dr. Jean Rosenthal, tylenol not given due to elevated AST and ALT.

## 2020-12-05 NOTE — H&P (Signed)
Christian Mcintyre is an 65 y.o. male.   Chief Complaint: Back and left greater than right leg pain HPI: 65 year old gentleman previously undergone L2-S1 fusion over successive operations and presents with progressive worsening back bilateral hip and leg pain worse on the left.  Work-up revealed disc herniation at L1-L2 with severe segmental degeneration above his previous fusion.  Due to the patient's progression of clinical syndrome imaging findings and failed conservative treatment I recommended decompression interbody fusion at L1-L2 with placement of bilateral L1 pedicle screws and marrying up to his old construct.  I have extensively gone over the risks and benefits of the operation with him as well as perioperative course expectations of outcome and alternatives of surgery and he understands and agrees to proceed forward.  Past Medical History:  Diagnosis Date  . Fatty liver due to alcoholism   . History of kidney stones    seen on xray- no problem  . Hypertension   . Sleep apnea    cpap  . Stroke [facility]) 2016   TIA- no residual     Past Surgical History:  Procedure Laterality Date  . BACK SURGERY     x2  . COLONOSCOPY    . EP IMPLANTABLE DEVICE N/A 05/31/2015   Procedure: Loop Recorder Insertion;  Surgeon: Hillis Range, MD;  Location: [facility] INVASIVE CV LAB;  Service: Cardiovascular;  Laterality: N/A;  . HERNIA REPAIR     umbical  . JOINT REPLACEMENT     Hip   . lumbar L4/5 fusion in 2008 Bilateral 2008  . TEE WITHOUT CARDIOVERSION N/A 05/16/2015   Procedure: TRANSESOPHAGEAL ECHOCARDIOGRAM (TEE);  Surgeon: Vesta Mixer, MD;  Location: [facility] ENDOSCOPY;  Service: Cardiovascular;  Laterality: N/A;    Family History  Problem Relation Age of Onset  . Heart attack Maternal Grandfather   . Hypertension Brother    Social History:  reports that he quit smoking about 22 years ago. He quit after 35.00 years of use. He has never used smokeless tobacco. He reports previous alcohol use. He reports  previous drug use.  Allergies: No Known Allergies  Medications Prior to Admission  Medication Sig Dispense Refill  . amLODipine (NORVASC) 10 MG tablet Take 10 mg by mouth daily.    Marland Kitchen atorvastatin (LIPITOR) 40 MG tablet Take 1 tablet (40 mg total) by mouth daily at 6 PM. 30 tablet 0  . pantoprazole (PROTONIX) 40 MG tablet Take 40 mg by mouth daily.    . valsartan-hydrochlorothiazide (DIOVAN-HCT) 320-12.5 MG per tablet Take 1 tablet by mouth daily.    Marland Kitchen aspirin EC 81 MG tablet Take 81 mg by mouth daily. Swallow whole.- on hold      Results for orders placed or performed during the hospital encounter of 12/05/20 (from the past 48 hour(s))  Protime-INR     Status: None   Collection Time: 12/05/20  8:47 AM  Result Value Ref Range   Prothrombin Time 12.8 11.4 - 15.2 seconds   INR 1.0 0.8 - 1.2    Comment: (NOTE) INR goal varies based on device and disease states. Performed at [facility], [address] [place], [place] [address]   CBC per protocol     Status: None   Collection Time: 12/05/20  8:47 AM  Result Value Ref Range   WBC 4.1 4.0 - 10.5 K/uL   RBC 4.34 4.22 - 5.81 MIL/uL   Hemoglobin 14.4 13.0 - 17.0 g/dL   HCT 16.0 10.9 - 32.3 %   MCV 97.9  80.0 - 100.0 fL   MCH 33.2 26.0 - 34.0 pg   MCHC 33.9 30.0 - 36.0 g/dL   RDW 92.9 24.4 - 62.8 %   Platelets 281 150 - 400 K/uL   nRBC 0.0 0.0 - 0.2 %    Comment: Performed at [facility], [address] [place], [place] [address]  Type and screen [facility]     Status: None (Preliminary result)   Collection Time: 12/05/20  9:25 AM  Result Value Ref Range   ABO/RH(D) PENDING    Antibody Screen PENDING    Sample Expiration      12/08/2020,2359 Performed at [facility], [address], [place], [place] [address]    No results found.  Review of Systems  Musculoskeletal: Positive for back pain.  Neurological: Positive for numbness.    Blood pressure 122/61, pulse 83, temperature 98.1 F  (36.7 C), temperature source Oral, resp. rate 17, height 6\' 2"  (1.88 m), weight 119.4 kg, SpO2 95 %. Physical Exam HENT:     Head: Normocephalic.     Right Ear: Tympanic membrane normal.     Nose: Nose normal.  Eyes:     Pupils: Pupils are equal, round, and reactive to light.  Cardiovascular:     Rate and Rhythm: Normal rate.     Pulses: Normal pulses.  Pulmonary:     Effort: Pulmonary effort is normal.  Abdominal:     General: Abdomen is flat.  Musculoskeletal:        General: Normal range of motion.  Skin:    General: Skin is warm.  Neurological:     General: No focal deficit present.     Mental Status: He is alert.     Comments: Strength is 5 x 5 iliopsoas, quads, hamstring, gastroc, tibialis, and EHL.      Assessment/Plan 65 year old presents for L1-L2 decompression fusion.  68, MD 12/05/2020, 10:04 AM

## 2020-12-06 MED ORDER — OXYCODONE-ACETAMINOPHEN 5-325 MG PO TABS: 1.0000 | ORAL_TABLET | ORAL | 0 refills | Status: AC | PRN

## 2020-12-06 MED ORDER — METHOCARBAMOL 500 MG PO TABS: 500.0000 mg | ORAL_TABLET | Freq: Four times a day (QID) | ORAL | 0 refills | Status: AC

## 2020-12-06 NOTE — Discharge Instructions (Signed)

## 2020-12-06 NOTE — Discharge Summary (Signed)
Physician Discharge Summary  Patient ID: Christian Mcintyre MRN: [id number] DOB/AGE: 03-07-1956 65 y.o.  Admit date: 12/05/2020 Discharge date: 12/06/2020  Admission Diagnoses: Lumbar spinal stenosis herniated nucleus pulposus and instability L1-L2    Discharge Diagnoses: same   Discharged Condition: good  Hospital Course: The patient was admitted on 12/05/2020 and taken to the operating room where the patient underwent PLIF L1-2. The patient tolerated the procedure well and was taken to the recovery room and then to the floor in stable condition. The hospital course was routine. There were no complications. The wound remained clean dry and intact. Pt had appropriate back soreness. No complaints of leg pain or new N/T/W. The patient remained afebrile with stable vital signs, and tolerated a regular diet. The patient continued to increase activities, and pain was well controlled with oral pain medications.   Consults: None  Significant Diagnostic Studies:  Results for orders placed or performed during the hospital encounter of 12/05/20  Surgical pcr screen   Specimen: Nasal Mucosa; Nasal Swab  Result Value Ref Range   MRSA, PCR NEGATIVE NEGATIVE   Staphylococcus aureus NEGATIVE NEGATIVE  Protime-INR  Result Value Ref Range   Prothrombin Time 12.8 11.4 - 15.2 seconds   INR 1.0 0.8 - 1.2  CBC per protocol  Result Value Ref Range   WBC 4.1 4.0 - 10.5 K/uL   RBC 4.34 4.22 - 5.81 MIL/uL   Hemoglobin 14.4 13.0 - 17.0 g/dL   HCT 76.2 83.1 - 51.7 %   MCV 97.9 80.0 - 100.0 fL   MCH 33.2 26.0 - 34.0 pg   MCHC 33.9 30.0 - 36.0 g/dL   RDW 61.6 07.3 - 71.0 %   Platelets 281 150 - 400 K/uL   nRBC 0.0 0.0 - 0.2 %  Basic metabolic panel  Result Value Ref Range   Sodium 136 135 - 145 mmol/L   Potassium 3.4 (L) 3.5 - 5.1 mmol/L   Chloride 100 98 - 111 mmol/L   CO2 23 22 - 32 mmol/L   Glucose, Bld 89 70 - 99 mg/dL   BUN 8 8 - 23 mg/dL   Creatinine, Ser 6.26 0.61 - 1.24 mg/dL   Calcium 9.4 8.9 -  94.8 mg/dL   GFR, Estimated >54 >62 mL/min   Anion gap 13 5 - 15  Type and screen [facility]  Result Value Ref Range   ABO/RH(D) A POS    Antibody Screen NEG    Sample Expiration      12/08/2020,2359 Performed at [facility], [address], [place], [place] [address]     DG THORACOLUMABAR SPINE  Result Date: 12/05/2020 CLINICAL DATA:  PLIF L1-L2 EXAM: DG C-ARM 1-60 MIN; THORACOLUMBAR SPINE - 2 VIEW FLUOROSCOPY TIME:  Fluoroscopy Time: 24 seconds Number of Acquired Spot Images: 2 COMPARISON:  Radiographs 08/11/2020.  MRI in 06/2021. FINDINGS: Two C-arm fluoroscopic images were obtained intraoperatively and submitted for post operative interpretation. These images demonstrate bilateral pedicle screws at L1 and interbody spacer at L1-L2. Prior posterior fusion hardware extending from L2 inferiorly is partially imaged. Please see the performing provider's procedural report for further detail. IMPRESSION: Intraoperative fluoroscopy, as detailed above. Electronically Signed   By: Feliberto Harts MD   On: 12/05/2020 15:50   DG C-Arm 1-60 Min  Result Date: 12/05/2020 CLINICAL DATA:  PLIF L1-L2 EXAM: DG C-ARM 1-60 MIN; THORACOLUMBAR SPINE - 2 VIEW FLUOROSCOPY TIME:  Fluoroscopy Time: 24 seconds Number of Acquired Spot Images: 2 COMPARISON:  Radiographs  08/11/2020.  MRI in 06/2021. FINDINGS: Two C-arm fluoroscopic images were obtained intraoperatively and submitted for post operative interpretation. These images demonstrate bilateral pedicle screws at L1 and interbody spacer at L1-L2. Prior posterior fusion hardware extending from L2 inferiorly is partially imaged. Please see the performing provider's procedural report for further detail. IMPRESSION: Intraoperative fluoroscopy, as detailed above. Electronically Signed   By: Feliberto Harts MD   On: 12/05/2020 15:50    Antibiotics:  Anti-infectives (From admission, onward)   Start     Dose/Rate Route Frequency Ordered Stop    12/06/20 0600  ceFAZolin (ANCEF) IVPB 2g/100 mL premix  Status:  Discontinued        2 g 200 mL/hr over 30 Minutes Intravenous On call to O.R. 12/05/20 1507 12/05/20 1520   12/05/20 2000  ceFAZolin (ANCEF) IVPB 2g/100 mL premix        2 g 200 mL/hr over 30 Minutes Intravenous Every 8 hours 12/05/20 1507 12/06/20 0346   12/05/20 0900  ceFAZolin (ANCEF) IVPB 2g/100 mL premix        2 g 200 mL/hr over 30 Minutes Intravenous On call to O.R. 12/05/20 0846 12/05/20 1123      Discharge Exam: Blood pressure 126/74, pulse 88, temperature 98.2 F (36.8 C), temperature source Oral, resp. rate 18, height 6\' 2"  (1.88 m), weight 119.4 kg, SpO2 94 %. Neurologic: Grossly normal Ambulating and voiding, incision cdi  Discharge Medications:   Allergies as of 12/06/2020   No Known Allergies     Medication List    TAKE these medications   amLODipine 10 MG tablet Commonly known as: NORVASC Take 10 mg by mouth daily.   aspirin EC 81 MG tablet Take 81 mg by mouth daily. Swallow whole.- on hold   atorvastatin 40 MG tablet Commonly known as: LIPITOR Take 1 tablet (40 mg total) by mouth daily at 6 PM.   methocarbamol 500 MG tablet Commonly known as: Robaxin Take 1 tablet (500 mg total) by mouth 4 (four) times daily.   oxyCODONE-acetaminophen 5-325 MG tablet Commonly known as: Percocet Take 1 tablet by mouth every 4 (four) hours as needed for severe pain.   pantoprazole 40 MG tablet Commonly known as: PROTONIX Take 40 mg by mouth daily.   valsartan-hydrochlorothiazide 320-12.5 MG tablet Commonly known as: DIOVAN-HCT Take 1 tablet by mouth daily.       Disposition: home   Final Dx: PLIF L1-2  Discharge Instructions     Remove dressing in 72 hours   Complete by: As directed    Call MD for:  difficulty breathing, headache or visual disturbances   Complete by: As directed    Call MD for:  hives   Complete by: As directed    Call MD for:  persistant dizziness or light-headedness    Complete by: As directed    Call MD for:  persistant nausea and vomiting   Complete by: As directed    Call MD for:  redness, tenderness, or signs of infection (pain, swelling, redness, odor or green/yellow discharge around incision site)   Complete by: As directed    Call MD for:  severe uncontrolled pain   Complete by: As directed    Call MD for:  temperature >100.4   Complete by: As directed    Diet - low sodium heart healthy   Complete by: As directed    Driving Restrictions   Complete by: As directed    No driving for 2 weeks, no riding in the car  for 1 week   Increase activity slowly   Complete by: As directed    Lifting restrictions   Complete by: As directed    No lifting more than 8 lbs         Signed: Tiana Loft Zariah Jost 12/06/2020, 7:41 AM

## 2020-12-06 NOTE — Progress Notes (Signed)
Patient was wheeled down by volunteer for discharge to home; in no acute distress nor major pain complains; all belongings taken along with him; discharge instructions given and verbalized understanding.

## 2021-01-17 ENCOUNTER — Encounter (HOSPITAL_COMMUNITY): Payer: Self-pay | Admitting: Neurosurgery
